# Patient Record
Sex: Female | Born: 1935 | Race: White | Hispanic: No | Marital: Married | Smoking: Never smoker
Health system: Southern US, Community
[De-identification: ages and names within clinical notes are randomized; demographics above are authoritative.]

## PROBLEM LIST (undated history)

## (undated) DIAGNOSIS — I471 Supraventricular tachycardia, unspecified: Secondary | ICD-10-CM

## (undated) DIAGNOSIS — T7840XA Allergy, unspecified, initial encounter: Secondary | ICD-10-CM

## (undated) DIAGNOSIS — I493 Ventricular premature depolarization: Secondary | ICD-10-CM

## (undated) DIAGNOSIS — I10 Essential (primary) hypertension: Secondary | ICD-10-CM

## (undated) DIAGNOSIS — C50219 Malignant neoplasm of upper-inner quadrant of unspecified female breast: Secondary | ICD-10-CM

## (undated) DIAGNOSIS — K219 Gastro-esophageal reflux disease without esophagitis: Secondary | ICD-10-CM

## (undated) DIAGNOSIS — N189 Chronic kidney disease, unspecified: Secondary | ICD-10-CM

## (undated) DIAGNOSIS — I491 Atrial premature depolarization: Secondary | ICD-10-CM

## (undated) DIAGNOSIS — C50919 Malignant neoplasm of unspecified site of unspecified female breast: Secondary | ICD-10-CM

## (undated) DIAGNOSIS — L719 Rosacea, unspecified: Secondary | ICD-10-CM

## (undated) DIAGNOSIS — R011 Cardiac murmur, unspecified: Secondary | ICD-10-CM

## (undated) DIAGNOSIS — M1990 Unspecified osteoarthritis, unspecified site: Secondary | ICD-10-CM

## (undated) DIAGNOSIS — E119 Type 2 diabetes mellitus without complications: Secondary | ICD-10-CM

## (undated) DIAGNOSIS — E785 Hyperlipidemia, unspecified: Secondary | ICD-10-CM

## (undated) DIAGNOSIS — Z923 Personal history of irradiation: Secondary | ICD-10-CM

## (undated) HISTORY — DX: Cardiac murmur, unspecified: R01.1

## (undated) HISTORY — DX: Rosacea, unspecified: L71.9

## (undated) HISTORY — DX: Supraventricular tachycardia: I47.1

## (undated) HISTORY — DX: Unspecified osteoarthritis, unspecified site: M19.90

## (undated) HISTORY — PX: OTHER SURGICAL HISTORY: SHX169

## (undated) HISTORY — DX: Supraventricular tachycardia, unspecified: I47.10

## (undated) HISTORY — PX: SPHINCTEROTOMY: SHX5279

## (undated) HISTORY — PX: BELPHAROPTOSIS REPAIR: SHX369

## (undated) HISTORY — PX: RECTOCELE REPAIR: SHX761

## (undated) HISTORY — DX: Hyperlipidemia, unspecified: E78.5

## (undated) HISTORY — DX: Ventricular premature depolarization: I49.3

## (undated) HISTORY — DX: Atrial premature depolarization: I49.1

## (undated) HISTORY — DX: Type 2 diabetes mellitus without complications: E11.9

## (undated) HISTORY — DX: Allergy, unspecified, initial encounter: T78.40XA

## (undated) HISTORY — PX: JOINT REPLACEMENT: SHX530

## (undated) HISTORY — DX: Essential (primary) hypertension: I10

## (undated) HISTORY — DX: Malignant neoplasm of upper-inner quadrant of unspecified female breast: C50.219

## (undated) HISTORY — PX: EYE SURGERY: SHX253

---
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
## 1975-03-03 HISTORY — PX: TUBAL LIGATION: SHX77

## 1975-03-03 HISTORY — PX: ABDOMINAL HYSTERECTOMY: SHX81

## 1983-03-03 DIAGNOSIS — R011 Cardiac murmur, unspecified: Secondary | ICD-10-CM

## 1983-03-03 HISTORY — DX: Cardiac murmur, unspecified: R01.1

## 1990-03-02 DIAGNOSIS — I10 Essential (primary) hypertension: Secondary | ICD-10-CM

## 1990-03-02 HISTORY — DX: Essential (primary) hypertension: I10

## 2002-03-02 DIAGNOSIS — M1990 Unspecified osteoarthritis, unspecified site: Secondary | ICD-10-CM

## 2002-03-02 HISTORY — DX: Unspecified osteoarthritis, unspecified site: M19.90

## 2002-03-02 HISTORY — PX: OTHER SURGICAL HISTORY: SHX169

## 2002-10-04 ENCOUNTER — Ambulatory Visit (HOSPITAL_BASED_OUTPATIENT_CLINIC_OR_DEPARTMENT_OTHER): Admission: RE | Admit: 2002-10-04 | Discharge: 2002-10-04 | Payer: Self-pay | Admitting: Orthopedic Surgery

## 2004-10-14 ENCOUNTER — Ambulatory Visit: Payer: Self-pay | Admitting: Internal Medicine

## 2004-10-28 ENCOUNTER — Ambulatory Visit: Payer: Self-pay | Admitting: Internal Medicine

## 2004-10-28 ENCOUNTER — Encounter (INDEPENDENT_AMBULATORY_CARE_PROVIDER_SITE_OTHER): Payer: Self-pay | Admitting: *Deleted

## 2005-01-01 ENCOUNTER — Ambulatory Visit: Payer: Self-pay | Admitting: Ophthalmology

## 2005-01-07 ENCOUNTER — Ambulatory Visit: Payer: Self-pay | Admitting: Ophthalmology

## 2005-02-19 ENCOUNTER — Ambulatory Visit: Payer: Self-pay | Admitting: Ophthalmology

## 2005-03-02 HISTORY — PX: CATARACT EXTRACTION: SUR2

## 2005-03-04 ENCOUNTER — Ambulatory Visit: Payer: Self-pay | Admitting: Ophthalmology

## 2007-03-03 DIAGNOSIS — E119 Type 2 diabetes mellitus without complications: Secondary | ICD-10-CM

## 2007-03-03 HISTORY — DX: Type 2 diabetes mellitus without complications: E11.9

## 2008-09-09 DIAGNOSIS — C4491 Basal cell carcinoma of skin, unspecified: Secondary | ICD-10-CM

## 2008-09-09 HISTORY — DX: Basal cell carcinoma of skin, unspecified: C44.91

## 2009-03-02 HISTORY — PX: COLONOSCOPY: SHX174

## 2009-03-02 LAB — HM COLONOSCOPY

## 2009-08-26 ENCOUNTER — Ambulatory Visit: Payer: Self-pay | Admitting: Family Medicine

## 2009-09-23 ENCOUNTER — Encounter: Payer: Self-pay | Admitting: Internal Medicine

## 2009-10-04 ENCOUNTER — Encounter (INDEPENDENT_AMBULATORY_CARE_PROVIDER_SITE_OTHER): Payer: Self-pay | Admitting: *Deleted

## 2009-11-01 ENCOUNTER — Encounter (INDEPENDENT_AMBULATORY_CARE_PROVIDER_SITE_OTHER): Payer: Self-pay | Admitting: *Deleted

## 2009-11-06 ENCOUNTER — Ambulatory Visit: Payer: Self-pay | Admitting: Internal Medicine

## 2009-11-13 ENCOUNTER — Ambulatory Visit: Payer: Self-pay | Admitting: Internal Medicine

## 2009-11-17 ENCOUNTER — Encounter: Payer: Self-pay | Admitting: Internal Medicine

## 2010-03-02 DIAGNOSIS — C50919 Malignant neoplasm of unspecified site of unspecified female breast: Secondary | ICD-10-CM

## 2010-03-02 DIAGNOSIS — C50219 Malignant neoplasm of upper-inner quadrant of unspecified female breast: Secondary | ICD-10-CM

## 2010-03-02 DIAGNOSIS — Z923 Personal history of irradiation: Secondary | ICD-10-CM

## 2010-03-02 DIAGNOSIS — E785 Hyperlipidemia, unspecified: Secondary | ICD-10-CM

## 2010-03-02 HISTORY — DX: Malignant neoplasm of unspecified site of unspecified female breast: C50.919

## 2010-03-02 HISTORY — DX: Hyperlipidemia, unspecified: E78.5

## 2010-03-02 HISTORY — DX: Personal history of irradiation: Z92.3

## 2010-03-02 HISTORY — DX: Malignant neoplasm of upper-inner quadrant of unspecified female breast: C50.219

## 2010-03-02 HISTORY — PX: BREAST LUMPECTOMY: SHX2

## 2010-03-02 HISTORY — PX: BREAST MAMMOSITE: SHX5264

## 2010-03-02 HISTORY — PX: OTHER SURGICAL HISTORY: SHX169

## 2010-04-01 NOTE — Letter (Signed)
Summary: [facility] Instructions  [facility]  [address] [place], [place] [address]   Phone: [phone]  Fax: [phone]       Paige Medina    January 18, 1934    MRN: [id number]        Procedure Day Dorna Bloom: Wednesday   11-13-09     Arrival Time: 12:30 p.m.     Procedure Time: 1:30 p.m.     Location of Procedure:                    _x_  [facility] (4th Floor)   PREPARATION FOR COLONOSCOPY WITH MOVIPREP   Starting 5 days prior to your procedure  11-08-09 do not eat nuts, seeds, popcorn, corn, beans, peas,  salads, or any raw vegetables.  Do not take any fiber supplements (e.g. Metamucil, Citrucel, and Benefiber).  THE DAY BEFORE YOUR PROCEDURE         DATE:  11-12-73  DAY: Tuesday  1.  Drink clear liquids the entire day-NO SOLID FOOD  2.  Do not drink anything colored red or purple.  Avoid juices with pulp.  No orange juice.  3.  Drink at least 64 oz. (8 glasses) of fluid/clear liquids during the day to prevent dehydration and help the prep work efficiently.  CLEAR LIQUIDS INCLUDE: Water Jello Ice Popsicles Tea (sugar ok, no milk/cream) Powdered fruit flavored drinks Coffee (sugar ok, no milk/cream) Gatorade Juice: apple, white grape, white cranberry  Lemonade Clear bullion, consomm, broth Carbonated beverages (any kind) Strained chicken noodle soup Hard Candy                             4.  In the morning, mix first dose of MoviPrep solution:    Empty 1 Pouch A and 1 Pouch B into the disposable container    Add lukewarm drinking water to the top line of the container. Mix to dissolve    Refrigerate (mixed solution should be used within 24 hrs)  5.  Begin drinking the prep at 5:00 p.m. The MoviPrep container is divided by 4 marks.   Every 15 minutes drink the solution down to the next mark (approximately 8 oz) until the full liter is complete.   6.  Follow completed prep with 16 oz of clear liquid of your choice (Nothing red or  purple).  Continue to drink clear liquids until bedtime.  7.  Before going to bed, mix second dose of MoviPrep solution:    Empty 1 Pouch A and 1 Pouch B into the disposable container    Add lukewarm drinking water to the top line of the container. Mix to dissolve    Refrigerate  THE DAY OF YOUR PROCEDURE      DATE:  11-13-09 DAY: Wednesday  Beginning at  8:30 a.m. (5 hours before procedure):         1. Every 15 minutes, drink the solution down to the next mark (approx 8 oz) until the full liter is complete.  2. Follow completed prep with 16 oz. of clear liquid of your choice.    3. You may drink clear liquids until  11:30 a.m.  (2 HOURS BEFORE PROCEDURE).   MEDICATION INSTRUCTIONS  Unless otherwise instructed, you should take regular prescription medications with a small sip of water   as early as possible the morning of your procedure.  Additional medication instructions: Hold Lasartan/HCTZ the morning of procedure.  OTHER INSTRUCTIONS  You will need a responsible adult at least 75 years of age to accompany you and drive you home.   This person must remain in the waiting room during your procedure.  Wear loose fitting clothing that is easily removed.  Leave jewelry and other valuables at home.  However, you may wish to bring a book to read or  an iPod/MP3 player to listen to music as you wait for your procedure to start.  Remove all body piercing jewelry and leave at home.  Total time from sign-in until discharge is approximately 2-3 hours.  You should go home directly after your procedure and rest.  You can resume normal activities the  day after your procedure.  The day of your procedure you should not:   Drive   Make legal decisions   Operate machinery   Drink alcohol   Return to work  You will receive specific instructions about eating, activities and medications before you leave.    The above instructions have been reviewed and explained  to me by  Wyona Almas RN  November 06, 2009 1:07 PM     I fully understand and can verbalize these instructions _____________________________ Date _________

## 2010-04-01 NOTE — Letter (Signed)
Summary: Previsit letter  [facility]  [address] [place], [place] [address]   Phone: [phone]  Fax: [phone]       10/04/2009 MRN: [id number]  Paige Medina [address] [place], [place]  [address]  Dear [name],  Welcome to the Gastroenterology Division at [facility].    You are scheduled to see a nurse for your pre-procedure visit on 11/06/2009 at 1:00 pm on the 3rd floor at [facility], [address]  We ask that you try to arrive at our office 15 minutes prior to your appointment time to allow for check-in.  Your nurse visit will consist of discussing your medical and surgical history, your immediate family medical history, and your medications.    Please bring a complete list of all your medications or, if you prefer, bring the medication bottles and we will list them.  We will need to be aware of both prescribed and over the counter drugs.  We will need to know exact dosage information as well.  If you are on blood thinners (Coumadin, Plavix, Aggrenox, Ticlid, etc.) please call our office today/prior to your appointment, as we need to consult with your physician about holding your medication.   Please be prepared to read and sign documents such as consent forms, a financial agreement, and acknowledgement forms.  If necessary, and with your consent, a friend or relative is welcome to sit-in on the nurse visit with you.  Please bring your insurance card so that we may make a copy of it.  If your insurance requires a referral to see a specialist, please bring your referral form from your primary care physician.  No co-pay is required for this nurse visit.     If you cannot keep your appointment, please call [phone] to cancel or reschedule prior to your appointment date.  This allows [place] the opportunity to schedule an appointment for another patient in need of care.    Thank you for choosing [facility] for your medical  needs.  We appreciate the opportunity to care for you.  Please visit [place] at our website  to learn more about our practice.                     Sincerely.                                                                                                                   The Gastroenterology Division

## 2010-04-01 NOTE — Letter (Signed)
Summary: Colonoscopy Letter  [facility]  [address] [place], [place] [address]   Phone: [phone]  Fax: [phone]      September 23, 2009 MRN: [id number]   Paige Medina [address] [place], [place]  [address]   Dear Ms. Fleeger,   According to your medical record, it is time for you to schedule a Colonoscopy. The American Cancer Society recommends this procedure as a method to detect early colon cancer. Patients with a family history of colon cancer, or a personal history of colon polyps or inflammatory bowel disease are at increased risk.  This letter has been generated based on the recommendations made at the time of your procedure. If you feel that in your particular situation this may no longer apply, please contact our office.  Please call our office at [phone] to schedule this appointment or to update your records at your earliest convenience.  Thank you for cooperating with [place] to provide you with the very best care possible.   Sincerely,  Wilhemina Bonito. Marina Goodell, M.D.  [facility] Gastroenterology Division [phone]

## 2010-04-01 NOTE — Procedures (Signed)
Summary: Colonoscopy  Patient: Cyra Spader Note: All result statuses are Final unless otherwise noted.  Tests: (1) Colonoscopy (COL)   COL Colonoscopy           DONE     [facility]     [address]     [place], [place]  [address]           COLONOSCOPY PROCEDURE REPORT           PATIENT:  Valisha, Heslin  MR#:  [id number]     BIRTHDATE:  Jul 28, 1933, 76 yrs. old  GENDER:  female     ENDOSCOPIST:  Wilhemina Bonito. Eda Keys, MD     REF. BY:  Surveillance Program Recall     PROCEDURE DATE:  11/13/2009     PROCEDURE:  Colonoscopy with snare polypectomy x 1     ASA CLASS:  Class II     INDICATIONS:  history of polyps, surveillance and high-risk     screening ; index 2003 NAT ; 2006 w/ HP     MEDICATIONS:   Fentanyl 75 mcg IV, Versed 9 mg IV           DESCRIPTION OF PROCEDURE:   After the risks benefits and     alternatives of the procedure were thoroughly explained, informed     consent was obtained.  Digital rectal exam was performed and     revealed no abnormalities.   The LB CF-H180AL [id number] endoscope     was introduced through the anus and advanced to the cecum, which     was identified by both the appendix and ileocecal valve, without     limitations.Time to cecum = 5:57 min  The quality of the prep was     good, using MoviPrep.  The instrument was then slowly withdrawn     (time = 13:29 min) as the colon was fully examined.     <<PROCEDUREIMAGES>>           FINDINGS:  A diminutive polyp was found in the mid transverse     colon.  Moderate diverticulosis was found in the sigmoid colon.     This was otherwise a normal examination of the colon.   Retroflexed     views in the rectum revealed internal hemorrhoids.    The scope     was then withdrawn from the patient and the procedure completed.           COMPLICATIONS:  None           ENDOSCOPIC IMPRESSION:     1) Diminutive polyp in the mid transverse colon - removed     2) Moderate diverticulosis in the sigmoid  colon     3) Otherwise normal examination     4) Internal hemorrhoids           RECOMMENDATIONS:     1) Repeat colonoscopy in 5 years if polyp adenomatous; otherwise     prn     ______________________________     Wilhemina Bonito. Eda Keys, MD           CC:  Adriana Reams MD; The Patient           n.     eSIGNED:   Wilhemina Bonito. Eda Keys at 11/13/2009 02:38 PM           Ronnald Collum, [id number]  Note: An exclamation mark (!) indicates a result that was not dispersed into the flowsheet. Document Creation Date: 11/13/2009 2:39  PM _______________________________________________________________________  (1) Order result status: Final Collection or observation date-time: 11/13/2009 14:29 Requested date-time:  Receipt date-time:  Reported date-time:  Referring Physician:   Ordering Physician: Fransico Setters [id number]) Specimen Source:  Source: Launa Grill Order Number: [id number] Lab site:   Appended Document: Colonoscopy recall     Procedures Next Due Date:    Colonoscopy: 11/2014

## 2010-04-01 NOTE — Miscellaneous (Signed)
Summary: [facility] Previsit/Prep  Clinical Lists Changes  Medications: Added new medication of MOVIPREP 100 GM  SOLR (PEG-KCL-NACL-NASULF-NA ASC-C) As per prep instructions. - Signed Rx of MOVIPREP 100 GM  SOLR (PEG-KCL-NACL-NASULF-NA ASC-C) As per prep instructions.;  #1 x 0;  Signed;  Entered by: Wyona Almas RN;  Authorized by: Hilarie Fredrickson MD;  Method used: Electronically to [facility]*, [address], [place], [place]  [address], Ph: [phone], Fax: [phone] Allergies: Added new allergy or adverse reaction of FLAGYL Observations: Added new observation of NKA: F (11/06/2009 12:26)    Prescriptions: MOVIPREP 100 GM  SOLR (PEG-KCL-NACL-NASULF-NA ASC-C) As per prep instructions.  #1 x 0   Entered by:   Wyona Almas RN   Authorized by:   Hilarie Fredrickson MD   Signed by:   Wyona Almas RN on 11/06/2009   Method used:   Electronically to        [facility]* (retail)       [address]       [place], [place]  [address]       Ph: [phone]       Fax: [phone]   RxID:   [id number]

## 2010-04-01 NOTE — Letter (Signed)
Summary: Patient Notice- Polyp Results  [facility]  [address] [place], [place] [address]   Phone: [phone]  Fax: [phone]        November 17, 2009 MRN: [id number]    KRYSTYL CANNELL [address] [place], [place]  [address]    Dear Ms. Province,  I am pleased to inform you that the colon polyp(s) removed during your recent colonoscopy was (were) found to be benign (no cancer detected) upon pathologic examination.  I recommend you have a repeat colonoscopy examination in 5 years to look for recurrent polyps, as having colon polyps increases your risk for having recurrent polyps or even colon cancer in the future.  Should you develop new or worsening symptoms of abdominal pain, bowel habit changes or bleeding from the rectum or bowels, please schedule an evaluation with either your primary care physician or with me.  Additional information/recommendations:  __ No further action with gastroenterology is needed at this time. Please      follow-up with your primary care physician for your other healthcare      needs.   Please call us if you are having persistent problems or have questions about your condition that have not been fully answered at this time.  Sincerely,  Hilarie Fredrickson MD  This letter has been electronically signed by your physician.  Appended Document: Patient Notice- Polyp Results letter mailed

## 2010-06-17 ENCOUNTER — Ambulatory Visit: Payer: Self-pay | Admitting: Family Medicine

## 2010-07-18 NOTE — Op Note (Signed)
   NAME:  Paige Medina, CISLO                       ACCOUNT NO.:  [id number]   MEDICAL RECORD NO.:  [id number]                   PATIENT TYPE:  AMB   LOCATION:  DSC                                  FACILITY:  [facility]   PHYSICIAN:  Artist Pais. Mina Marble, M.D.           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   PROCEDURE:  Left wrist arthroscopic ganglionectomy and injection of  bilateral carpometacarpal joints.   SURGEON:  Artist Pais. Mina Marble, M.D.   ASSISTANT:  Aura Fey. Bobbe Medico.   ANESTHESIA:  General.   TOURNIQUET TIME:  45 minutes.   COMPLICATIONS:  None.   DRAINS:  None.   PROCEDURE:  The patient was taken to the operating room and after the  induction of adequate general anesthesia the left upper extremity was  prepped and draped in the usual sterile fashion. An Esmarch was used to  exsanguinate the limb.  The tourniquet was then inflated to 250 mmHg at this  point in time. The upper extremity on the left side was padded and placed in  the Concept wrist traction tower with 12 pounds of counter-traction across  the radial carpal joint.  A 4-5 portal was established followed by under  direct vision a 6-2 outflow portal with an 18 gauge needle.  The scope was  then placed in the interval between the scapholunate.  Traction was placed  over the ganglion cyst dorsally and the stalk could be seen. An 18 gauge  needle was then used to pierce the cyst into the joint.  Cyst fluid was seen  going into the joint.  A 15-blade was then used to incise the skin over the  cyst and the suction shaver was then introduced and the stalk was shaved  down. A dorsal shaving was also undertaken to the extensor tendon  where  visible in the arthroscopic field.  The cyst was completely decompressed  through this 3-4 portal.  These wounds were closed with 5-0 nylon and the  patient's bilateral CMC joints were then injected with a combination of  Marcaine and Kenalog.  A sterile dressing was then placed with Xeroform, 4 x  4's and a volar splint.  The patient tolerated the procedure well and went  to recovery in satisfactory fashion.                                               Artist Pais Mina Marble, M.D.    MAW/MEDQ  D:  10/04/2002  T:  10/04/2002  Job:  [id number]

## 2010-09-16 ENCOUNTER — Ambulatory Visit: Payer: Self-pay | Admitting: Family Medicine

## 2010-09-16 LAB — HM DEXA SCAN

## 2010-09-17 ENCOUNTER — Ambulatory Visit: Payer: Self-pay | Admitting: Family Medicine

## 2010-10-01 ENCOUNTER — Ambulatory Visit: Payer: Self-pay | Admitting: Radiation Oncology

## 2010-10-03 ENCOUNTER — Ambulatory Visit: Payer: Self-pay | Admitting: General Surgery

## 2010-10-09 ENCOUNTER — Ambulatory Visit: Payer: Self-pay | Admitting: General Surgery

## 2010-10-29 ENCOUNTER — Ambulatory Visit: Payer: Self-pay | Admitting: Radiation Oncology

## 2010-11-01 ENCOUNTER — Ambulatory Visit: Payer: Self-pay | Admitting: Radiation Oncology

## 2010-11-11 ENCOUNTER — Ambulatory Visit: Payer: Self-pay | Admitting: Radiation Oncology

## 2010-12-01 ENCOUNTER — Ambulatory Visit: Payer: Self-pay | Admitting: Radiation Oncology

## 2011-01-01 ENCOUNTER — Ambulatory Visit: Payer: Self-pay | Admitting: Radiation Oncology

## 2011-04-01 ENCOUNTER — Ambulatory Visit: Payer: Self-pay | Admitting: General Surgery

## 2011-05-20 ENCOUNTER — Ambulatory Visit: Payer: Self-pay | Admitting: Radiation Oncology

## 2011-06-01 ENCOUNTER — Ambulatory Visit: Payer: Self-pay | Admitting: Radiation Oncology

## 2011-09-21 ENCOUNTER — Ambulatory Visit: Payer: Self-pay | Admitting: General Surgery

## 2012-03-28 ENCOUNTER — Ambulatory Visit: Payer: Self-pay | Admitting: General Surgery

## 2012-07-20 ENCOUNTER — Encounter: Payer: Self-pay | Admitting: *Deleted

## 2012-07-20 DIAGNOSIS — Z853 Personal history of malignant neoplasm of breast: Secondary | ICD-10-CM | POA: Insufficient documentation

## 2012-09-14 ENCOUNTER — Ambulatory Visit: Payer: Self-pay | Admitting: Family Medicine

## 2012-10-04 ENCOUNTER — Ambulatory Visit: Payer: Self-pay | Admitting: General Surgery

## 2012-10-05 ENCOUNTER — Encounter: Payer: Self-pay | Admitting: General Surgery

## 2012-10-17 ENCOUNTER — Encounter: Payer: Self-pay | Admitting: General Surgery

## 2012-10-17 ENCOUNTER — Ambulatory Visit (INDEPENDENT_AMBULATORY_CARE_PROVIDER_SITE_OTHER): Payer: Medicare Other | Admitting: General Surgery

## 2012-10-17 VITALS — BP 124/68 | HR 74 | Resp 12 | Ht 66.0 in | Wt 157.0 lb

## 2012-10-17 DIAGNOSIS — Z853 Personal history of malignant neoplasm of breast: Secondary | ICD-10-CM

## 2012-10-17 NOTE — Progress Notes (Signed)
Patient ID: Paige Medina, female   DOB: 1933-10-22, 79 y.o.   MRN: [id number]  Chief Complaint  Patient presents with  . Other    mammogram    HPI Paige Medina is a 79 y.o. female who presents for a breast evaluation. The most recent mammogram was done on 10/04/12 with a birad category 3.Patient does perform regular self breast checks and gets regular mammograms done.  The patient denies any new problems with the breasts.  Pt in 2 years postlumpectomy, SN biopsy/radiation for her left  breast cancer. Patient did not tolerate Aromatase Inhibitors-  Declines to try again.   HPI  Past Medical History  Diagnosis Date  . Arthritis 2004  . Cancer 2012    left breast  . Diabetes mellitus without complication 2009    non insulin dependent  . Hypertension 1992  . Bone spur   . H/O cystitis   . Murmur 1985  . Personal history of malignant neoplasm of breast 2012  . Breast screening, unspecified 2012  . Lump or mass in breast 2012  . Special screening for malignant neoplasms, colon   . Hyperlipidemia 2012  . Malignant neoplasm of upper-inner quadrant of female breast 2012    Past Surgical History  Procedure Laterality Date  . Colonoscopy  2011    [place]  . Basal cell carcinoma removal  1980,2013    arms, legs, around neck, on right ear  . Eye surgery Bilateral 2007    cataract surgery  . Breast mammosite Left 2012    placed and removed  . Rectocele repair  [id number]  . Sphincterotomy    . Breast surgery Left 2012    lumpectomy  . Abdominal hysterectomy  1978    partial  . Tubal ligation  1977  . Tonsillectomy  1947  . Appendectomy  1943    Family History  Problem Relation Age of Onset  . Cancer Maternal Grandfather     lung cancer    Social History History  Substance Use Topics  . Smoking status: Never Smoker   . Smokeless tobacco: Not on file  . Alcohol Use: Yes     Comment: ocassionally    Allergies  Allergen Reactions  . Epinephrine Other (See  Comments)    Blisters, confirmed(2012) by Dr. Joana Reamer since pt last visit couldn't remember  . Metronidazole     REACTION: Nausea/vomiting  . Tape Other (See Comments)    Blisters    Current Outpatient Prescriptions  Medication Sig Dispense Refill  . Calcium Carbonate-Vitamin D (CALCIUM 600 + D PO) Take by mouth.      . Coenzyme Q10 (COQ-10 PO) Take by mouth.      Tery Sanfilippo Calcium (STOOL SOFTENER PO) Take by mouth.      . metoprolol (LOPRESSOR) 50 MG tablet Take 25 mg by mouth daily.      . Multiple Vitamins-Minerals (CENTRUM SILVER PO) Take by mouth.      . olmesartan-hydrochlorothiazide (BENICAR HCT) 20-12.5 MG per tablet Take 1 tablet by mouth daily.      . Probiotic Product (PROBIOTIC DAILY PO) Take by mouth.      . simvastatin (ZOCOR) 40 MG tablet Take 40 mg by mouth daily.       No current facility-administered medications for this visit.    Review of Systems Review of Systems  Constitutional: Negative.   Respiratory: Negative.   Cardiovascular: Negative.     Blood pressure 124/68, pulse 74, resp. rate 12, height 5\' 6"  (  1.676 m), weight 157 lb (71.215 kg).  Physical Exam Physical Exam  Constitutional: She is oriented to person, place, and time. She appears well-developed and well-nourished.  Eyes: Conjunctivae are normal. No scleral icterus.  Neck: No thyromegaly present.  Cardiovascular: Normal rate, regular rhythm, normal heart sounds and normal pulses.   No murmur heard. Pulses:      Dorsalis pedis pulses are 2+ on the right side, and 2+ on the left side.       Posterior tibial pulses are 2+ on the right side, and 2+ on the left side.  No edema. No VV  Pulmonary/Chest: Effort normal and breath sounds normal. Right breast exhibits no inverted nipple, no mass, no nipple discharge, no skin change and no tenderness. Left breast exhibits no inverted nipple, no mass, no nipple discharge, no skin change and no tenderness.  Left breast - minimal scarring at lumpectomy  site. Concentrated at the lateral end.   Abdominal: Soft. Bowel sounds are normal. There is no hepatosplenomegaly. There is no tenderness. No hernia.  Lymphadenopathy:    She has no cervical adenopathy.    She has no axillary adenopathy.  Neurological: She is alert and oriented to person, place, and time.  Skin: Skin is warm and dry.    Data Reviewed Mammogram reviewed.   Assessment    Stable exam.     Plan Bilateral Diagnostic Mammogram in 1 year.        SANKAR,SEEPLAPUTHUR G 10/18/2012, 9:39 AM

## 2012-10-17 NOTE — Patient Instructions (Addendum)
Patient to return in 1 year with bilateral diagnostic mammogram.  

## 2012-10-18 ENCOUNTER — Encounter: Payer: Self-pay | Admitting: General Surgery

## 2013-01-05 ENCOUNTER — Other Ambulatory Visit: Payer: Self-pay

## 2013-03-02 LAB — HM DIABETES EYE EXAM

## 2013-03-15 LAB — HEMOGLOBIN A1C: A1C: 6.3

## 2013-03-15 LAB — COMPREHENSIVE METABOLIC PANEL
CREATININE: 1.1
Calcium: 10 mg/dL
GFR: 49

## 2013-03-15 LAB — LIPID PANEL
Cholesterol: 158 mg/dL (ref 0–200)
HDL: 59 mg/dL (ref 35–70)
LDL CALC: 50
Triglycerides: 243

## 2013-06-13 ENCOUNTER — Encounter: Payer: Self-pay | Admitting: Family Medicine

## 2013-06-13 ENCOUNTER — Ambulatory Visit (INDEPENDENT_AMBULATORY_CARE_PROVIDER_SITE_OTHER): Payer: Medicare HMO | Admitting: Family Medicine

## 2013-06-13 VITALS — BP 144/74 | HR 64 | Temp 97.7°F | Ht 65.5 in | Wt 157.5 lb

## 2013-06-13 DIAGNOSIS — E1169 Type 2 diabetes mellitus with other specified complication: Secondary | ICD-10-CM | POA: Insufficient documentation

## 2013-06-13 DIAGNOSIS — M1990 Unspecified osteoarthritis, unspecified site: Secondary | ICD-10-CM | POA: Insufficient documentation

## 2013-06-13 DIAGNOSIS — L719 Rosacea, unspecified: Secondary | ICD-10-CM | POA: Insufficient documentation

## 2013-06-13 DIAGNOSIS — C50919 Malignant neoplasm of unspecified site of unspecified female breast: Secondary | ICD-10-CM

## 2013-06-13 DIAGNOSIS — R7303 Prediabetes: Secondary | ICD-10-CM | POA: Insufficient documentation

## 2013-06-13 DIAGNOSIS — E119 Type 2 diabetes mellitus without complications: Secondary | ICD-10-CM

## 2013-06-13 DIAGNOSIS — N189 Chronic kidney disease, unspecified: Secondary | ICD-10-CM

## 2013-06-13 DIAGNOSIS — E785 Hyperlipidemia, unspecified: Secondary | ICD-10-CM

## 2013-06-13 DIAGNOSIS — I10 Essential (primary) hypertension: Secondary | ICD-10-CM

## 2013-06-13 DIAGNOSIS — N183 Chronic kidney disease, stage 3 unspecified: Secondary | ICD-10-CM | POA: Insufficient documentation

## 2013-06-13 DIAGNOSIS — M129 Arthropathy, unspecified: Secondary | ICD-10-CM

## 2013-06-13 DIAGNOSIS — E1122 Type 2 diabetes mellitus with diabetic chronic kidney disease: Secondary | ICD-10-CM | POA: Insufficient documentation

## 2013-06-13 NOTE — Progress Notes (Signed)
BP 144/74  Pulse 64  Temp(Src) 97.7 F (36.5 C) (Oral)  Ht 5' 5.5" (1.664 m)  Wt 157 lb 8 oz (71.442 kg)  BMI 25.80 kg/m2   CC: new pt to establish  Subjective:    Patient ID: Paige Medina, female    DOB: May 23, 1933, 80 y.o.   MRN: [id number]  HPI: Paige Medina is a 80 y.o. female presenting on 06/13/2013 for Establish Care   Prior saw Dr. Jeananne Rama.  Bad hip arthritis - R more painful but L looks works.  Pending R hip injection (Dr. Eliberto Ivory at [facility])  HTN - compliant with benicar hctz 20/12.5mg  and toprol xl 50mg  1/2 tablet daily.  No low bp sxs or readings.  No HA, vision changes, CP/tightness, SOB, leg swelling.    ?kidney disease.  Due for recheck.  DM - states diet controlled.  Last checked 4 mo ago.  Occasional hand paresthesias.  No low sugar sxs.  HLD - lipitor caused myalgias.  Tolerating simvastatin will.  H/o recurrent skin cancer in past - basal and squamous.  No h/o melanoma.  Lives with husband, no pets Grown children Occupation: retired, varied jobs Veterinary surgeon Activity: gym 3d/wk, active mentally with crosswords Diet: good water, fruits/vegetables daily  Preventative: Last CPE 09/2012 Colon cancer screening 2011 Mammogram 10/2012 Pneumovax 2002? Relevant past medical, surgical, family and social history reviewed and updated as indicated.  Allergies and medications reviewed and updated. Current Outpatient Prescriptions on File Prior to Visit  Medication Sig  . Calcium Carbonate-Vitamin D (CALCIUM 600 + D PO) Take 1 tablet by mouth 2 (two) times daily.   . Coenzyme Q10 (COQ-10 PO) Take 1 capsule by mouth 3 (three) times a week.   Mariane Baumgarten Calcium (STOOL SOFTENER PO) Take 1 capsule by mouth daily.   . Multiple Vitamins-Minerals (CENTRUM SILVER PO) Take 1 tablet by mouth 2 (two) times a week.   . olmesartan-hydrochlorothiazide (BENICAR HCT) 20-12.5 MG per tablet Take 1 tablet by mouth daily.  . Probiotic Product (PROBIOTIC DAILY PO) Take 1 capsule by  mouth daily.   . simvastatin (ZOCOR) 40 MG tablet Take 40 mg by mouth daily.   No current facility-administered medications on file prior to visit.    Review of Systems Per HPI unless specifically indicated above    Objective:    BP 144/74  Pulse 64  Temp(Src) 97.7 F (36.5 C) (Oral)  Ht 5' 5.5" (1.664 m)  Wt 157 lb 8 oz (71.442 kg)  BMI 25.80 kg/m2  Physical Exam  Nursing note and vitals reviewed. Constitutional: She is oriented to person, place, and time. She appears well-developed and well-nourished. No distress.  HENT:  Head: Normocephalic and atraumatic.  Mouth/Throat: Uvula is midline, oropharynx is clear and moist and mucous membranes are normal.  Eyes: Conjunctivae and EOM are normal. Pupils are equal, round, and reactive to light. No scleral icterus.  Neck: Normal range of motion. Neck supple. Carotid bruit is not present. No thyromegaly present.  Cardiovascular: Normal rate, regular rhythm, normal heart sounds and intact distal pulses.   No murmur heard. Pulses:      Radial pulses are 2+ on the right side, and 2+ on the left side.  Pulmonary/Chest: Effort normal and breath sounds normal. No respiratory distress. She has no wheezes. She has no rales.  Musculoskeletal: Normal range of motion. She exhibits no edema.  Lymphadenopathy:    She has no cervical adenopathy.  Neurological: She is alert and oriented to person, place, and time.  CN grossly intact, station and gait intact  Skin: Skin is warm and dry. No rash noted.  Psychiatric: She has a normal mood and affect. Her behavior is normal. Judgment and thought content normal.   Results for orders placed in visit on 06/13/13  HM DIABETES EYE EXAM      Result Value Ref Range   HM Diabetic Eye Exam No Retinopathy  No Retinopathy      Assessment & Plan:   Problem List Items Addressed This Visit   Breast cancer     S/p lumpectomy 2012.   Sounds like she did not tolerate evista.    Arthritis     Follows  regularly with ortho.  Pending L hip injection    Diabetes mellitus without complication - Primary     Check A1c today.  Diet controlled. Await records.    Relevant Orders      Renal function panel      Hemoglobin A1c      Microalbumin / creatinine urine ratio   Hypertension     Chronic, stable. Continue current regimen.    Relevant Medications      metoprolol succinate (TOPROL-XL) 50 MG 24 hr tablet   Hyperlipidemia     Chronic, continue simvastatin. Check FLP next fasting blood work.    Relevant Medications      metoprolol succinate (TOPROL-XL) 50 MG 24 hr tablet   Rosacea   CKD (chronic kidney disease)     Check renal panel today and await records to compare. Pt endorses recent h/o worsening kidney function, attributed to magnesium use - now off this supplement.        Follow up plan: Return in about 4 months (around 10/13/2013), or as needed, for annual exam, prior fasting for blood work.

## 2013-06-13 NOTE — Assessment & Plan Note (Signed)
Follows regularly with ortho.  Pending L hip injection

## 2013-06-13 NOTE — Assessment & Plan Note (Signed)
Check renal panel today and await records to compare. Pt endorses recent h/o worsening kidney function, attributed to magnesium use - now off this supplement.

## 2013-06-13 NOTE — Assessment & Plan Note (Signed)
Chronic, stable. Continue current regimen. 

## 2013-06-13 NOTE — Patient Instructions (Addendum)
Good to meet you today, call us with questions. Blood work today and we will call you with results. Return in 6 months for wellness exam, prior fasting for blood work.

## 2013-06-13 NOTE — Progress Notes (Signed)
Pre visit review using our clinic review tool, if applicable. No additional management support is needed unless otherwise documented below in the visit note. 

## 2013-06-13 NOTE — Assessment & Plan Note (Signed)
Chronic, continue simvastatin. Check FLP next fasting blood work.

## 2013-06-13 NOTE — Assessment & Plan Note (Signed)
S/p lumpectomy 2012.   Sounds like she did not tolerate evista.

## 2013-06-13 NOTE — Assessment & Plan Note (Signed)
Check A1c today.  Diet controlled. Await records.

## 2013-06-14 ENCOUNTER — Telehealth: Payer: Self-pay | Admitting: Family Medicine

## 2013-06-14 LAB — RENAL FUNCTION PANEL
ALBUMIN: 4.1 g/dL (ref 3.5–5.2)
BUN: 29 mg/dL — AB (ref 6–23)
CALCIUM: 10.3 mg/dL (ref 8.4–10.5)
CHLORIDE: 102 meq/L (ref 96–112)
CO2: 31 meq/L (ref 19–32)
CREATININE: 1.3 mg/dL — AB (ref 0.4–1.2)
GFR: 41.72 mL/min — ABNORMAL LOW (ref >60.00)
Glucose, Bld: 91 mg/dL (ref 70–99)
Phosphorus: 4 mg/dL (ref 2.3–4.6)
Potassium: 4.4 mEq/L (ref 3.5–5.1)
Sodium: 140 mEq/L (ref 135–145)

## 2013-06-14 LAB — MICROALBUMIN / CREATININE URINE RATIO
Creatinine,U: 41.5 mg/dL
MICROALB UR: 0.1 mg/dL (ref 0.0–1.9)
MICROALB/CREAT RATIO: 0.2 mg/g (ref 0.0–30.0)

## 2013-06-14 LAB — HEMOGLOBIN A1C: Hgb A1c MFr Bld: 6.6 % — ABNORMAL HIGH (ref 4.6–6.5)

## 2013-06-14 NOTE — Telephone Encounter (Signed)
Relevant patient education assigned to patient using Emmi. ° °

## 2013-06-27 ENCOUNTER — Telehealth: Payer: Self-pay

## 2013-06-27 NOTE — Telephone Encounter (Signed)
Relevant patient education assigned to patient using Emmi. ° °

## 2013-07-01 ENCOUNTER — Encounter: Payer: Self-pay | Admitting: Family Medicine

## 2013-07-03 ENCOUNTER — Encounter: Payer: Self-pay | Admitting: *Deleted

## 2013-07-20 ENCOUNTER — Encounter: Payer: Self-pay | Admitting: Family Medicine

## 2013-09-02 ENCOUNTER — Other Ambulatory Visit: Payer: Self-pay | Admitting: Family Medicine

## 2013-09-02 DIAGNOSIS — N183 Chronic kidney disease, stage 3 (moderate): Secondary | ICD-10-CM

## 2013-09-02 DIAGNOSIS — E785 Hyperlipidemia, unspecified: Secondary | ICD-10-CM

## 2013-09-06 ENCOUNTER — Other Ambulatory Visit (INDEPENDENT_AMBULATORY_CARE_PROVIDER_SITE_OTHER): Payer: Medicare HMO

## 2013-09-06 DIAGNOSIS — N183 Chronic kidney disease, stage 3 unspecified: Secondary | ICD-10-CM

## 2013-09-06 DIAGNOSIS — E785 Hyperlipidemia, unspecified: Secondary | ICD-10-CM

## 2013-09-06 DIAGNOSIS — I10 Essential (primary) hypertension: Secondary | ICD-10-CM

## 2013-09-06 DIAGNOSIS — E119 Type 2 diabetes mellitus without complications: Secondary | ICD-10-CM

## 2013-09-06 LAB — COMPREHENSIVE METABOLIC PANEL
ALK PHOS: 57 U/L (ref 39–117)
ALT: 26 U/L (ref 0–35)
AST: 28 U/L (ref 0–37)
Albumin: 3.9 g/dL (ref 3.5–5.2)
BILIRUBIN TOTAL: 0.7 mg/dL (ref 0.2–1.2)
BUN: 22 mg/dL (ref 6–23)
CO2: 31 mEq/L (ref 19–32)
Calcium: 9.6 mg/dL (ref 8.4–10.5)
Chloride: 103 mEq/L (ref 96–112)
Creatinine, Ser: 1.2 mg/dL (ref 0.4–1.2)
GFR: 46.14 mL/min — ABNORMAL LOW (ref >60.00)
GLUCOSE: 127 mg/dL — AB (ref 70–99)
Potassium: 4.7 mEq/L (ref 3.5–5.1)
Sodium: 140 mEq/L (ref 135–145)
Total Protein: 7.6 g/dL (ref 6.0–8.3)

## 2013-09-06 LAB — LIPID PANEL
CHOL/HDL RATIO: 4
CHOLESTEROL: 192 mg/dL (ref 0–200)
HDL: 53.6 mg/dL (ref >39.00)
LDL Cholesterol: 100 mg/dL — ABNORMAL HIGH (ref 0–99)
NonHDL: 138.4
Triglycerides: 192 mg/dL — ABNORMAL HIGH (ref 0.0–149.0)
VLDL: 38.4 mg/dL (ref 0.0–40.0)

## 2013-09-06 LAB — VITAMIN D 25 HYDROXY (VIT D DEFICIENCY, FRACTURES): VITD: 47.32 ng/mL

## 2013-09-11 ENCOUNTER — Other Ambulatory Visit: Payer: Medicare HMO

## 2013-09-14 ENCOUNTER — Ambulatory Visit (INDEPENDENT_AMBULATORY_CARE_PROVIDER_SITE_OTHER): Payer: Medicare HMO | Admitting: Family Medicine

## 2013-09-14 ENCOUNTER — Encounter: Payer: Self-pay | Admitting: Family Medicine

## 2013-09-14 VITALS — BP 116/78 | HR 56 | Temp 98.0°F | Ht 65.5 in | Wt 158.5 lb

## 2013-09-14 DIAGNOSIS — Z0000 Encounter for general adult medical examination without abnormal findings: Secondary | ICD-10-CM | POA: Insufficient documentation

## 2013-09-14 DIAGNOSIS — N183 Chronic kidney disease, stage 3 unspecified: Secondary | ICD-10-CM

## 2013-09-14 DIAGNOSIS — Z23 Encounter for immunization: Secondary | ICD-10-CM

## 2013-09-14 DIAGNOSIS — E119 Type 2 diabetes mellitus without complications: Secondary | ICD-10-CM

## 2013-09-14 DIAGNOSIS — E785 Hyperlipidemia, unspecified: Secondary | ICD-10-CM

## 2013-09-14 DIAGNOSIS — M129 Arthropathy, unspecified: Secondary | ICD-10-CM

## 2013-09-14 DIAGNOSIS — C50919 Malignant neoplasm of unspecified site of unspecified female breast: Secondary | ICD-10-CM

## 2013-09-14 DIAGNOSIS — I10 Essential (primary) hypertension: Secondary | ICD-10-CM

## 2013-09-14 DIAGNOSIS — M1990 Unspecified osteoarthritis, unspecified site: Secondary | ICD-10-CM

## 2013-09-14 NOTE — Patient Instructions (Signed)
prevnar today (pneumonia shot) Bring me a copy of your advanced directive. Return in 6 months for labwork and afterwards office visit to recheck diabetes. Good to see you today, call us with questions.

## 2013-09-14 NOTE — Assessment & Plan Note (Signed)
Chronic, stable. Diet controlled. rtc 6 mo DM f/u.

## 2013-09-14 NOTE — Assessment & Plan Note (Signed)
Reviewed labwork. Stable. Encouraged good hydration status and avoiding NSAIDs.

## 2013-09-14 NOTE — Assessment & Plan Note (Signed)
Followed regularly by surgery Mammogram pending later this year.  Did not tolerate evista.

## 2013-09-14 NOTE — Assessment & Plan Note (Signed)
On tylenol prn for this. Avoid NSAIDs 2/2 CKD.

## 2013-09-14 NOTE — Addendum Note (Signed)
Addended by: Royann Shivers A on: 09/14/2013 11:29 AM   Modules accepted: Orders

## 2013-09-14 NOTE — Assessment & Plan Note (Signed)
Chronic, stable. Continue current regimen. 

## 2013-09-14 NOTE — Progress Notes (Signed)
BP 116/78  Pulse 56  Temp(Src) 98 F (36.7 C) (Oral)  Ht 5' 5.5" (1.664 m)  Wt 158 lb 8 oz (71.895 kg)  BMI 25.97 kg/m2   CC: medicare wellness visit  Subjective:    Patient ID: Paige Medina, female    DOB: 15-Jan-1934, 80 y.o.   MRN: [id number]  HPI: Paige Medina is a 80 y.o. female presenting on 09/14/2013 for Annual Exam   Passes hearing and vision screens today Denies depression,sadness,anhedonia, falls in last year.  Preventative:  COLONOSCOPY Date: 2011 WNL, rec rpt 5 yrs [name]). Hesitant to repeat colonoscopy - would agree to stool kit Well woman - no recent pap smear, s/p hysterectomy 1977. Mammogram 10/2012 - scheduled by Dr. Jamal Collin. H/o breast exam followed closely by Dr. Jamal Collin Dexa - 06/2013 - WNL Flu - yearly  Pneumovax 06/2005. prevnar today Td 06/2005 zostavax 04/2005 Advanced directives: has at home. HCPOA would be daughter.  Lives with husband, no pets  Grown children  Occupation: retired, varied jobs Veterinary surgeon  Activity: gym 3d/wk, active mentally with crosswords  Diet: good water, fruits/vegetables daily   Relevant past medical, surgical, family and social history reviewed and updated as indicated.  Allergies and medications reviewed and updated. Current Outpatient Prescriptions on File Prior to Visit  Medication Sig  . Ascorbic Acid (VITAMIN C PO) Take by mouth as needed.  . beta carotene w/minerals (OCUVITE) tablet Take 1 tablet by mouth daily.  . Calcium Carbonate-Vitamin D (CALCIUM 600 + D PO) Take 1 tablet by mouth 2 (two) times daily.   . Coenzyme Q10 (COQ-10 PO) Take 1 capsule by mouth 3 (three) times a week.   Mariane Baumgarten Calcium (STOOL SOFTENER PO) Take 1 capsule by mouth daily.   [name] estradiol (ESTRACE) 0.1 MG/GM vaginal cream Place 1 Applicatorful vaginally 2 (two) times a week.  . metoprolol succinate (TOPROL-XL) 50 MG 24 hr tablet Take 25 mg by mouth daily. Take with or immediately following a meal.  . Multiple Vitamins-Minerals  (CENTRUM SILVER PO) Take 1 tablet by mouth 2 (two) times a week.   . olmesartan-hydrochlorothiazide (BENICAR HCT) 20-12.5 MG per tablet Take 1 tablet by mouth daily.  . Probiotic Product (PROBIOTIC DAILY PO) Take 1 capsule by mouth daily.   . simvastatin (ZOCOR) 40 MG tablet Take 40 mg by mouth daily.   No current facility-administered medications on file prior to visit.    Review of Systems Per HPI unless specifically indicated above    Objective:    BP 116/78  Pulse 56  Temp(Src) 98 F (36.7 C) (Oral)  Ht 5' 5.5" (1.664 m)  Wt 158 lb 8 oz (71.895 kg)  BMI 25.97 kg/m2  Physical Exam  Nursing note and vitals reviewed. Constitutional: She is oriented to person, place, and time. She appears well-developed and well-nourished. No distress.  HENT:  Head: Normocephalic and atraumatic.  Right Ear: Hearing, tympanic membrane, external ear and ear canal normal.  Left Ear: Hearing, tympanic membrane, external ear and ear canal normal.  Nose: Nose normal.  Mouth/Throat: Uvula is midline, oropharynx is clear and moist and mucous membranes are normal. No oropharyngeal exudate, posterior oropharyngeal edema or posterior oropharyngeal erythema.  Eyes: Conjunctivae and EOM are normal. Pupils are equal, round, and reactive to light. No scleral icterus.  Neck: Normal range of motion. Neck supple. Carotid bruit is not present. No thyromegaly present.  Cardiovascular: Normal rate, regular rhythm, normal heart sounds and intact distal pulses.   No murmur heard. Pulses:  Radial pulses are 2+ on the right side, and 2+ on the left side.  Pulmonary/Chest: Effort normal and breath sounds normal. No respiratory distress. She has no wheezes. She has no rales.  Abdominal: Soft. Bowel sounds are normal. She exhibits no distension and no mass. There is no tenderness. There is no rebound and no guarding.  Musculoskeletal: Normal range of motion. She exhibits no edema.  Lymphadenopathy:    She has no  cervical adenopathy.  Neurological: She is alert and oriented to person, place, and time.  CN grossly intact, station and gait intact Recall 3/3 Calculation 5/5 serial 7s  Skin: Skin is warm and dry. No rash noted.  Psychiatric: She has a normal mood and affect. Her behavior is normal. Judgment and thought content normal.   Results for orders placed in visit on 09/14/13  HM COLONOSCOPY      Result Value Ref Range   HM Colonoscopy rpt 5 yrs        Assessment & Plan:   Problem List Items Addressed This Visit   Medicare annual wellness visit, subsequent - Primary     I have personally reviewed the Medicare Annual Wellness questionnaire and have noted 1. The patient's medical and social history 2. Their use of alcohol, tobacco or illicit drugs 3. Their current medications and supplements 4. The patient's functional ability including ADL's, fall risks, home safety risks and hearing or visual impairment. 5. Diet and physical activity 6. Evidence for depression or mood disorders The patients weight, height, BMI have been recorded in the chart.  Hearing and vision has been addressed. I have made referrals, counseling and provided education to the patient based review of the above and I have provided the pt with a written personalized care plan for preventive services. Provider list updated - see scanned questionairre. Advanced directives discussed:I've asked her to bring me a copy of living will. Daughter is HCPOA.  Reviewed preventative protocols and updated unless pt declined.    Hypertension     Chronic, stable. Continue current regimen.    Hyperlipidemia     Chronic, stable. Continue regimen of simvastatin.    Diabetes mellitus without complication     Chronic, stable. Diet controlled. rtc 6 mo DM f/u.    CKD (chronic kidney disease)     Reviewed labwork. Stable. Encouraged good hydration status and avoiding NSAIDs.    Breast cancer     Followed regularly by  surgery Mammogram pending later this year.  Did not tolerate evista.    Arthritis     On tylenol prn for this. Avoid NSAIDs 2/2 CKD.        Follow up plan: Return in about 6 months (around 03/17/2014), or as needed, for follow up.

## 2013-09-14 NOTE — Progress Notes (Signed)
Pre visit review using our clinic review tool, if applicable. No additional management support is needed unless otherwise documented below in the visit note. 

## 2013-09-14 NOTE — Assessment & Plan Note (Signed)
I have personally reviewed the Medicare Annual Wellness questionnaire and have noted 1. The patient's medical and social history 2. Their use of alcohol, tobacco or illicit drugs 3. Their current medications and supplements 4. The patient's functional ability including ADL's, fall risks, home safety risks and hearing or visual impairment. 5. Diet and physical activity 6. Evidence for depression or mood disorders The patients weight, height, BMI have been recorded in the chart.  Hearing and vision has been addressed. I have made referrals, counseling and provided education to the patient based review of the above and I have provided the pt with a written personalized care plan for preventive services. Provider list updated - see scanned questionairre. Advanced directives discussed:I've asked her to bring me a copy of living will. Daughter is HCPOA.  Reviewed preventative protocols and updated unless pt declined.

## 2013-09-14 NOTE — Assessment & Plan Note (Signed)
Chronic, stable. Continue regimen of simvastatin.

## 2013-09-19 ENCOUNTER — Encounter: Payer: Self-pay | Admitting: Family Medicine

## 2013-09-19 DIAGNOSIS — Z7189 Other specified counseling: Secondary | ICD-10-CM | POA: Insufficient documentation

## 2013-10-10 ENCOUNTER — Other Ambulatory Visit: Payer: Self-pay | Admitting: *Deleted

## 2013-10-10 MED ORDER — SIMVASTATIN 40 MG PO TABS: 40.0000 mg | ORAL_TABLET | Freq: Every day | ORAL | Status: DC

## 2013-10-12 ENCOUNTER — Ambulatory Visit: Payer: Self-pay | Admitting: General Surgery

## 2013-10-17 ENCOUNTER — Encounter: Payer: Self-pay | Admitting: General Surgery

## 2013-10-26 ENCOUNTER — Encounter: Payer: Self-pay | Admitting: General Surgery

## 2013-11-01 ENCOUNTER — Ambulatory Visit (INDEPENDENT_AMBULATORY_CARE_PROVIDER_SITE_OTHER): Payer: Private Health Insurance - Indemnity | Admitting: General Surgery

## 2013-11-01 ENCOUNTER — Encounter: Payer: Self-pay | Admitting: General Surgery

## 2013-11-01 VITALS — BP 148/68 | HR 62 | Resp 12 | Ht 65.0 in

## 2013-11-01 DIAGNOSIS — C50919 Malignant neoplasm of unspecified site of unspecified female breast: Secondary | ICD-10-CM

## 2013-11-01 DIAGNOSIS — C50912 Malignant neoplasm of unspecified site of left female breast: Secondary | ICD-10-CM

## 2013-11-01 NOTE — Patient Instructions (Addendum)
The patient has been asked to return to the office in one year with a bilateral mammogram.
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
## 2013-11-02 ENCOUNTER — Telehealth: Payer: Self-pay | Admitting: *Deleted

## 2013-11-02 LAB — CANCER ANTIGEN 27.29: CA 27.29: 22 U/mL (ref 0.0–38.6)

## 2013-11-02 NOTE — Progress Notes (Signed)
Quick Note:  Inform pt labs are normal. F/u as scheduled ______ 

## 2013-11-02 NOTE — Telephone Encounter (Signed)
Message copied by Carson Myrtle on Thu Nov 02, 2013  1:07 PM ------      Message from: Christene Lye      Created: Thu Nov 02, 2013 12:01 PM       Inform pt labs are normal. F/u as scheduled ------

## 2013-11-02 NOTE — Telephone Encounter (Signed)
Notified patient as instructed, patient pleased. Discussed follow-up in August of 2016, patient agrees.

## 2013-11-03 ENCOUNTER — Encounter: Payer: Self-pay | Admitting: General Surgery

## 2013-11-14 ENCOUNTER — Encounter: Payer: Self-pay | Admitting: Internal Medicine

## 2013-12-06 ENCOUNTER — Ambulatory Visit (INDEPENDENT_AMBULATORY_CARE_PROVIDER_SITE_OTHER): Payer: Medicare HMO

## 2013-12-06 DIAGNOSIS — Z23 Encounter for immunization: Secondary | ICD-10-CM

## 2013-12-12 ENCOUNTER — Encounter: Payer: Self-pay | Admitting: Family Medicine

## 2013-12-12 ENCOUNTER — Ambulatory Visit (INDEPENDENT_AMBULATORY_CARE_PROVIDER_SITE_OTHER): Payer: Medicare HMO | Admitting: Family Medicine

## 2013-12-12 VITALS — BP 130/74 | HR 64 | Temp 98.5°F | Wt 157.8 lb

## 2013-12-12 DIAGNOSIS — I10 Essential (primary) hypertension: Secondary | ICD-10-CM

## 2013-12-12 MED ORDER — LOSARTAN POTASSIUM-HCTZ 50-12.5 MG PO TABS: 1.0000 | ORAL_TABLET | Freq: Every day | ORAL | Status: DC

## 2013-12-12 NOTE — Progress Notes (Signed)
Pre visit review using our clinic review tool, if applicable. No additional management support is needed unless otherwise documented below in the visit note. 

## 2013-12-12 NOTE — Assessment & Plan Note (Signed)
Med change needed for insurance purposes. Will change benicar hctz to losartan hctz 50/12.5mg  daily. Pt checked - losartan is tier 1. Pt will monitor bp closely with change in meds, then will update me if bp uncontrolled. Pt agrees with plan.

## 2013-12-12 NOTE — Patient Instructions (Signed)
Let's stop benicar hct for now, try losartan hctz 50/12.5mg  once daily (at same time as you currently take benicar.)  Continue metoprolol succinate 25mg  daily. Monitor blood pressures more frequently while we try new medicine.

## 2013-12-12 NOTE — Progress Notes (Signed)
   BP 130/74  Pulse 64  Temp(Src) 98.5 F (36.9 C) (Oral)  Wt 157 lb 12 oz (71.555 kg)   CC: med concerns  Subjective:    Patient ID: Paige Medina, female    DOB: 18-Jun-1933, 80 y.o.   MRN: [id number]  HPI: TRISTIAN BOUSKA is a 80 y.o. female presenting on 12/12/2013 for Follow-up   BP regimen - metoprolol succinate 50mg  1/2 tablet daily and benicar hct 20/12.5mg  once daily. Insurance has changed, benicar hct now tier 4. Micardis caused dull headache.  Has tried benazepril, bisoprolol/hctz, felodipine, enalapril, diovan, amlodipine, exforge, clonidine (catapres), metoprolol, hctz, azor, micardis, and atenolol in past.  She has not tried losartan hctz in the past.   Relevant past medical, surgical, family and social history reviewed and updated as indicated.  Allergies and medications reviewed and updated. Current Outpatient Prescriptions on File Prior to Visit  Medication Sig  . Ascorbic Acid (VITAMIN C PO) Take by mouth as needed.  . beta carotene w/minerals (OCUVITE) tablet Take 1 tablet by mouth daily.  . Calcium Carbonate-Vitamin D (CALCIUM 600 + D PO) Take 1 tablet by mouth 2 (two) times daily.   . Coenzyme Q10 (COQ-10 PO) Take 1 capsule by mouth 3 (three) times a week.   Mariane Baumgarten Calcium (STOOL SOFTENER PO) Take 1 capsule by mouth daily.   [name] estradiol (ESTRACE) 0.1 MG/GM vaginal cream Place 1 Applicatorful vaginally 2 (two) times a week.  . Glucosamine-Chondroit-Vit C-Mn (GLUCOSAMINE 1500 COMPLEX PO) Take 1 capsule by mouth daily.  . metoprolol succinate (TOPROL-XL) 50 MG 24 hr tablet Take 25 mg by mouth daily. Take with or immediately following a meal.  . Multiple Vitamins-Minerals (CENTRUM SILVER PO) Take 1 tablet by mouth 2 (two) times a week.   . olmesartan-hydrochlorothiazide (BENICAR HCT) 20-12.5 MG per tablet Take 1 tablet by mouth daily.  . Omega-3 Fatty Acids (FISH OIL) 1200 MG CAPS Take 1 capsule by mouth daily.  . Potassium 75 MG TABS Take by mouth daily.    . Probiotic Product (PROBIOTIC DAILY PO) Take 1 capsule by mouth daily.   [name] pyridOXINE (VITAMIN B-6) 100 MG tablet Take 100 mg by mouth daily.  . simvastatin (ZOCOR) 40 MG tablet Take 1 tablet (40 mg total) by mouth at bedtime.  . Zinc Sulfate (ZINC 15 PO) Take by mouth daily.   No current facility-administered medications on file prior to visit.    Review of Systems Per HPI unless specifically indicated above    Objective:    BP 130/74  Pulse 64  Temp(Src) 98.5 F (36.9 C) (Oral)  Wt 157 lb 12 oz (71.555 kg)  Physical Exam  Nursing note and vitals reviewed. Constitutional: She appears well-developed and well-nourished. No distress.  Psychiatric: She has a normal mood and affect.       Assessment & Plan:   Problem List Items Addressed This Visit   Hypertension - Primary     Med change needed for insurance purposes. Will change benicar hctz to losartan hctz 50/12.5mg  daily. Pt checked - losartan is tier 1. Pt will monitor bp closely with change in meds, then will update me if bp uncontrolled. Pt agrees with plan.    Relevant Medications      LOSARTAN POTASSIUM-HCTZ 50-12.5 MG PO TABS       Follow up plan: Return as needed.

## 2014-01-01 ENCOUNTER — Encounter: Payer: Self-pay | Admitting: Family Medicine

## 2014-02-04 ENCOUNTER — Other Ambulatory Visit: Payer: Self-pay | Admitting: Family Medicine

## 2014-02-21 ENCOUNTER — Ambulatory Visit (INDEPENDENT_AMBULATORY_CARE_PROVIDER_SITE_OTHER): Payer: Medicare HMO | Admitting: Internal Medicine

## 2014-02-21 ENCOUNTER — Encounter: Payer: Self-pay | Admitting: Internal Medicine

## 2014-02-21 VITALS — BP 128/84 | HR 53 | Temp 97.8°F | Wt 157.0 lb

## 2014-02-21 DIAGNOSIS — R300 Dysuria: Secondary | ICD-10-CM

## 2014-02-21 DIAGNOSIS — B3731 Acute candidiasis of vulva and vagina: Secondary | ICD-10-CM

## 2014-02-21 DIAGNOSIS — B373 Candidiasis of vulva and vagina: Secondary | ICD-10-CM

## 2014-02-21 LAB — POCT URINALYSIS DIPSTICK
BILIRUBIN UA: NEGATIVE
Blood, UA: NEGATIVE
Glucose, UA: NEGATIVE
Ketones, UA: NEGATIVE
Nitrite, UA: NEGATIVE
Protein, UA: NEGATIVE
Spec Grav, UA: 1.015
Urobilinogen, UA: NEGATIVE
pH, UA: 6

## 2014-02-21 MED ORDER — FLUCONAZOLE 150 MG PO TABS: 150.0000 mg | ORAL_TABLET | Freq: Once | ORAL | Status: DC

## 2014-02-21 MED ORDER — CEPHALEXIN 500 MG PO CAPS: 500.0000 mg | ORAL_CAPSULE | Freq: Three times a day (TID) | ORAL | Status: DC

## 2014-02-21 NOTE — Progress Notes (Signed)
HPI  Pt presents to the clinic today with c/o dysuria and lower abdominal cramping. She reports this started 2 days ago. She denies fever, chills, nausea or low back pain. She has not tried anything OTC. She has had UTI's in the past. She has had some vaginal itching. She denies vaginal bleeding.   Review of Systems  Past Medical History  Diagnosis Date  . Arthritis 2004  . Diabetes mellitus without complication 3532    diet controlled  . Hypertension 1992  . Heart murmur 1985  . Hyperlipidemia 2012  . Benign colonic polyp   . Rosacea   . 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 Cancer Maternal Grandfather     lung cancer smoker  . CAD Father 37  . CAD Mother 26  . Stroke Paternal Grandfather   . Hypertension Mother   . Diabetes Mother   . Hypertension Father   . Kidney disease Mother     History   Social History  . Marital Status: Married    Spouse Name: N/A    Number of Children: N/A  . Years of Education: N/A   Occupational History  . Not on file.   Social History Main Topics  . Smoking status: Never Smoker   . Smokeless tobacco: Never Used     Comment: + second hand smoker exposure  . Alcohol Use: Yes     Comment: Rare  . Drug Use: No  . Sexual Activity: Not on file   Other Topics Concern  . Not on file   Social History Narrative   Lives with husband, no pets   Grown children   Occupation: retired, varied jobs Veterinary surgeon   Activity: gym 3d/wk, active mentally with crosswords   Diet: good water, fruits/vegetables daily      Advanced directives: pt states HCPOA is daughter but on actual HCPOA form Taraya Steward is listed first. Advanced directive scanned into chart.    Allergies  Allergen Reactions  . Lidocaine Other (See Comments)    Blisters  . Lipitor [Atorvastatin] Other (See Comments)    myalgias  . Metronidazole      REACTION: Nausea/vomiting  . Tape Other (See Comments)    Blisters--(Paper or cloth tape okay)    Constitutional: Denies fever, malaise, fatigue, headache or abrupt weight changes.   GU: Pt reports vaginal itching and pain with urination. Denies burning sensation, blood in urine, odor or discharge. Skin: Denies redness, rashes, lesions or ulcercations.   No other specific complaints in a complete review of systems (except as listed in HPI above).    Objective:   Physical Exam  BP 128/84 mmHg  Pulse 53  Temp(Src) 97.8 F (36.6 C) (Oral)  Wt 157 lb (71.215 kg)  SpO2 99% Wt Readings from Last 3 Encounters:  02/21/14 157 lb (71.215 kg)  12/12/13 157 lb 12 oz (71.555 kg)  09/14/13 158 lb 8 oz (71.895 kg)    General: Appears her stated age, well developed, well nourished in NAD. Cardiovascular: Normal rate and rhythm. S1,S2 noted.  No murmur, rubs or gallops noted.  Pulmonary/Chest: Normal effort and positive vesicular breath sounds. No respiratory distress. No wheezes, rales or ronchi noted.  Abdomen: Soft and nontender. Normal bowel sounds, no bruits noted. No distention or masses noted. Liver, spleen and kidneys non palpable. No CVA tenderness. GU: Normal female anatomy.  Redness and irritation noted of the external labia majora.     Assessment & Plan:    Dysuria, vaginal irritation and itching  Urinalysis: 1+ leuks Will send urine culture Print Rx sent if for Keflex 500 mg TID x 5 days if symptoms get worse over the weekend  OK to take AZO OTC Drink plenty of fluids ? Yeast- will send in RX for Diflucan to take now  RTC as needed or if symptoms persist.

## 2014-02-21 NOTE — Progress Notes (Signed)
Pre visit review using our clinic review tool, if applicable. No additional management support is needed unless otherwise documented below in the visit note. 

## 2014-02-21 NOTE — Patient Instructions (Signed)

## 2014-02-21 NOTE — Addendum Note (Signed)
Addended by: Lurlean Nanny on: 02/21/2014 04:54 PM   Modules accepted: Orders

## 2014-02-23 LAB — URINE CULTURE
Colony Count: NO GROWTH
Organism ID, Bacteria: NO GROWTH

## 2014-03-12 ENCOUNTER — Other Ambulatory Visit: Payer: Self-pay | Admitting: Family Medicine

## 2014-03-12 DIAGNOSIS — E785 Hyperlipidemia, unspecified: Secondary | ICD-10-CM

## 2014-03-12 DIAGNOSIS — I10 Essential (primary) hypertension: Secondary | ICD-10-CM

## 2014-03-12 DIAGNOSIS — E119 Type 2 diabetes mellitus without complications: Secondary | ICD-10-CM

## 2014-03-12 DIAGNOSIS — N183 Chronic kidney disease, stage 3 (moderate): Secondary | ICD-10-CM

## 2014-03-13 ENCOUNTER — Other Ambulatory Visit (INDEPENDENT_AMBULATORY_CARE_PROVIDER_SITE_OTHER): Payer: Medicare HMO

## 2014-03-13 DIAGNOSIS — E119 Type 2 diabetes mellitus without complications: Secondary | ICD-10-CM

## 2014-03-13 DIAGNOSIS — N183 Chronic kidney disease, stage 3 (moderate): Secondary | ICD-10-CM

## 2014-03-13 DIAGNOSIS — E785 Hyperlipidemia, unspecified: Secondary | ICD-10-CM

## 2014-03-13 LAB — LIPID PANEL
CHOL/HDL RATIO: 4
Cholesterol: 156 mg/dL (ref 0–200)
HDL: 38.6 mg/dL — ABNORMAL LOW (ref >39.00)
LDL CALC: 89 mg/dL (ref 0–99)
NONHDL: 117.4
Triglycerides: 144 mg/dL (ref 0.0–149.0)
VLDL: 28.8 mg/dL (ref 0.0–40.0)

## 2014-03-13 LAB — HEMOGLOBIN A1C: Hgb A1c MFr Bld: 7.1 % — ABNORMAL HIGH (ref 4.6–6.5)

## 2014-03-13 LAB — RENAL FUNCTION PANEL
Albumin: 4.1 g/dL (ref 3.5–5.2)
BUN: 21 mg/dL (ref 6–23)
CALCIUM: 9.4 mg/dL (ref 8.4–10.5)
CO2: 30 mEq/L (ref 19–32)
CREATININE: 1.2 mg/dL (ref 0.4–1.2)
Chloride: 103 mEq/L (ref 96–112)
GFR: 44.36 mL/min — ABNORMAL LOW (ref >60.00)
GLUCOSE: 129 mg/dL — AB (ref 70–99)
Phosphorus: 3.7 mg/dL (ref 2.3–4.6)
Potassium: 4.5 mEq/L (ref 3.5–5.1)
Sodium: 139 mEq/L (ref 135–145)

## 2014-03-20 ENCOUNTER — Ambulatory Visit: Payer: Medicare HMO | Admitting: Family Medicine

## 2014-03-20 ENCOUNTER — Ambulatory Visit (INDEPENDENT_AMBULATORY_CARE_PROVIDER_SITE_OTHER): Payer: Medicare HMO | Admitting: Family Medicine

## 2014-03-20 ENCOUNTER — Encounter: Payer: Self-pay | Admitting: Family Medicine

## 2014-03-20 VITALS — BP 114/62 | HR 60 | Temp 98.1°F | Wt 159.0 lb

## 2014-03-20 DIAGNOSIS — E785 Hyperlipidemia, unspecified: Secondary | ICD-10-CM

## 2014-03-20 DIAGNOSIS — I10 Essential (primary) hypertension: Secondary | ICD-10-CM

## 2014-03-20 DIAGNOSIS — E119 Type 2 diabetes mellitus without complications: Secondary | ICD-10-CM

## 2014-03-20 DIAGNOSIS — E1122 Type 2 diabetes mellitus with diabetic chronic kidney disease: Secondary | ICD-10-CM

## 2014-03-20 DIAGNOSIS — N183 Chronic kidney disease, stage 3 (moderate): Secondary | ICD-10-CM

## 2014-03-20 MED ORDER — METOPROLOL SUCCINATE ER 25 MG PO TB24: 12.5000 mg | ORAL_TABLET | Freq: Every day | ORAL | Status: DC

## 2014-03-20 MED ORDER — LOSARTAN POTASSIUM-HCTZ 50-12.5 MG PO TABS: 1.0000 | ORAL_TABLET | Freq: Every day | ORAL | Status: DC

## 2014-03-20 NOTE — Patient Instructions (Addendum)
Blood pressure is looking wonderful! Let's try a bit lower dose of Toprol XL - 25mg  sent in for you to take 1/2 tablet daily. Continue other meds as up to now. We will set you up for diabetes education at [facility]. We will call you to set this up.

## 2014-03-20 NOTE — Assessment & Plan Note (Signed)
Chronic, stable on losartan hctz. Continue current regimen.

## 2014-03-20 NOTE — Assessment & Plan Note (Signed)
Chronic, stable. Remains diet controlled. RTC 6 mo f/u. Foot exam today. Pt has not had diabetes education - will refer for this.

## 2014-03-20 NOTE — Assessment & Plan Note (Signed)
Chronic, stable. Continue current regimen. 

## 2014-03-20 NOTE — Progress Notes (Signed)
BP 114/62 mmHg  Pulse 60  Temp(Src) 98.1 F (36.7 C) (Oral)  Wt 159 lb (72.122 kg)   CC: 6 mo f/u  Subjective:    Patient ID: Paige Medina, female    DOB: 01-23-1934, 81 y.o.   MRN: [id number]  HPI: Paige Medina is a 81 y.o. female presenting on 03/20/2014 for Follow-up   Last visit we changed bp regimen 2/2 insurance formulary changes. We started losartan hctz 50/12.5mg  and pt has tolerated this well. Also on toprol xl 50mg  (1/2 tab daily). Denies low bp sxs of dizziness or syncope. No HA, vision changes, CP/tightness, SOB, leg swelling. Noted bradycardia.   Reviewed CKD stage 3 dx. Reviewed normal renal US 2012 (one renal cyst)  HLD - compliant with simvastatin without myalgias  DM - regularly does not check sugars.  Compliant with antihyperglycemic regimen which includes: diet controlled.  Denies low sugars or hypoglycemic symptoms.  Denies paresthesias. Last diabetic eye exam 03/2013, upcoming appt.  Pneumovax: 06/2005.  Prevnar: 08/2013. Lab Results  Component Value Date   HGBA1C 7.1* 03/13/2014   Diabetic Foot Exam - Simple   Simple Foot Form  Diabetic Foot exam was performed with the following findings:  Yes 03/20/2014  3:23 PM  Visual Inspection  No deformities, no ulcerations, no other skin breakdown bilaterally:  Yes  See comments:  Yes  Sensation Testing  Intact to touch and monofilament testing bilaterally:  Yes  Pulse Check  Posterior Tibialis and Dorsalis pulse intact bilaterally:  Yes  Comments  Bilateral great toe onychomycosis       Relevant past medical, surgical, family and social history reviewed and updated as indicated. Interim medical history since our last visit reviewed. Allergies and medications reviewed and updated. Current Outpatient Prescriptions on File Prior to Visit  Medication Sig  . Ascorbic Acid (VITAMIN C PO) Take by mouth as needed.  . beta carotene w/minerals (OCUVITE) tablet Take 1 tablet by mouth daily.  . Calcium  Carbonate-Vitamin D (CALCIUM 600 + D PO) Take 1 tablet by mouth 2 (two) times daily.   . Coenzyme Q10 (COQ-10 PO) Take 1 capsule by mouth 3 (three) times a week.   Mariane Baumgarten Calcium (STOOL SOFTENER PO) Take 1 capsule by mouth daily.   [name] estradiol (ESTRACE) 0.1 MG/GM vaginal cream Place 1 Applicatorful vaginally 2 (two) times a week.  . Omega-3 Fatty Acids (FISH OIL) 1200 MG CAPS Take 1 capsule by mouth daily.  . Potassium 75 MG TABS Take 75 mg by mouth as needed (when having feet/leg cramps).   . Probiotic Product (PROBIOTIC DAILY PO) Take 1 capsule by mouth daily.   . simvastatin (ZOCOR) 40 MG tablet Take 1 tablet (40 mg total) by mouth at bedtime.  . Zinc Sulfate (ZINC 15 PO) Take 15 mg by mouth as needed (with cold symptoms).    No current facility-administered medications on file prior to visit.    Review of Systems Per HPI unless specifically indicated above     Objective:    BP 114/62 mmHg  Pulse 60  Temp(Src) 98.1 F (36.7 C) (Oral)  Wt 159 lb (72.122 kg)  Wt Readings from Last 3 Encounters:  03/20/14 159 lb (72.122 kg)  02/21/14 157 lb (71.215 kg)  12/12/13 157 lb 12 oz (71.555 kg)    Physical Exam  Constitutional: She appears well-developed and well-nourished. No distress.  HENT:  Head: Normocephalic and atraumatic.  Right Ear: External ear normal.  Left Ear: External ear normal.  Nose:  Nose normal.  Mouth/Throat: Oropharynx is clear and moist. No oropharyngeal exudate.  Eyes: Conjunctivae and EOM are normal. Pupils are equal, round, and reactive to light. No scleral icterus.  Neck: Normal range of motion. Neck supple.  Cardiovascular: Normal rate, regular rhythm, normal heart sounds and intact distal pulses.   No murmur heard. Pulmonary/Chest: Effort normal and breath sounds normal. No respiratory distress. She has no wheezes. She has no rales.  Musculoskeletal: She exhibits no edema.  See HPI for foot exam if done  Lymphadenopathy:    She has no cervical  adenopathy.  Skin: Skin is warm and dry. No rash noted.  Psychiatric: She has a normal mood and affect.  Nursing note and vitals reviewed.  Results for orders placed or performed in visit on 03/13/14  Lipid panel  Result Value Ref Range   Cholesterol 156 0 - 200 mg/dL   Triglycerides 144.0 0.0 - 149.0 mg/dL   HDL 38.60 (L) >39.00 mg/dL   VLDL 28.8 0.0 - 40.0 mg/dL   LDL Cholesterol 89 0 - 99 mg/dL   Total CHOL/HDL Ratio 4    NonHDL 117.40   Hemoglobin A1c  Result Value Ref Range   Hgb A1c MFr Bld 7.1 (H) 4.6 - 6.5 %  Renal function panel  Result Value Ref Range   Sodium 139 135 - 145 mEq/L   Potassium 4.5 3.5 - 5.1 mEq/L   Chloride 103 96 - 112 mEq/L   CO2 30 19 - 32 mEq/L   Calcium 9.4 8.4 - 10.5 mg/dL   Albumin 4.1 3.5 - 5.2 g/dL   BUN 21 6 - 23 mg/dL   Creatinine, Ser 1.2 0.4 - 1.2 mg/dL   Glucose, Bld 129 (H) 70 - 99 mg/dL   Phosphorus 3.7 2.3 - 4.6 mg/dL   GFR 44.36 (L) >60.00 mL/min      Assessment & Plan:   Problem List Items Addressed This Visit    Hypertension    Chronic, stable on losartan hctz. Continue current regimen.      Relevant Medications   losartan-hydrochlorothiazide (HYZAAR) 50-12.5 MG per tablet   metoprolol succinate (TOPROL-XL) 24 hr tablet   Hyperlipidemia    Chronic, stable. Continue current regimen.      Relevant Medications   losartan-hydrochlorothiazide (HYZAAR) 50-12.5 MG per tablet   metoprolol succinate (TOPROL-XL) 24 hr tablet   Diabetes mellitus without complication - Primary    Chronic, stable. Remains diet controlled. RTC 6 mo f/u. Foot exam today. Pt has not had diabetes education - will refer for this.      Relevant Medications   losartan-hydrochlorothiazide (HYZAAR) 50-12.5 MG per tablet   Other Relevant Orders   Ambulatory referral to diabetic education   CKD stage 3 due to type 2 diabetes mellitus    Reviewed dx. Continue good hydration status and avoiding nephrotoxic medications.      Relevant Medications    losartan-hydrochlorothiazide (HYZAAR) 50-12.5 MG per tablet       Follow up plan: Return in about 6 months (around 09/18/2014), or as needed, for medicare wellness.

## 2014-03-20 NOTE — Assessment & Plan Note (Signed)
Reviewed dx. Continue good hydration status and avoiding nephrotoxic medications.

## 2014-03-20 NOTE — Progress Notes (Signed)
Pre visit review using our clinic review tool, if applicable. No additional management support is needed unless otherwise documented below in the visit note. 

## 2014-03-27 ENCOUNTER — Other Ambulatory Visit: Payer: Self-pay | Admitting: Family Medicine

## 2014-04-20 ENCOUNTER — Encounter: Payer: Self-pay | Admitting: Family Medicine

## 2014-04-20 LAB — HM DIABETES EYE EXAM

## 2014-05-15 ENCOUNTER — Ambulatory Visit
Admit: 2014-05-15 | Disposition: A | Discharge status: Home / Self Care | Payer: Self-pay | Attending: Family Medicine | Admitting: Family Medicine

## 2014-05-28 ENCOUNTER — Encounter: Payer: Self-pay | Admitting: Family Medicine

## 2014-06-01 ENCOUNTER — Ambulatory Visit
Admit: 2014-06-01 | Disposition: A | Discharge status: Home / Self Care | Payer: Self-pay | Attending: Family Medicine | Admitting: Family Medicine

## 2014-08-29 ENCOUNTER — Other Ambulatory Visit: Payer: Self-pay

## 2014-08-29 DIAGNOSIS — C50912 Malignant neoplasm of unspecified site of left female breast: Secondary | ICD-10-CM

## 2014-09-19 ENCOUNTER — Encounter: Payer: Self-pay | Admitting: Internal Medicine

## 2014-09-24 ENCOUNTER — Other Ambulatory Visit: Payer: Self-pay | Admitting: Family Medicine

## 2014-09-24 DIAGNOSIS — E119 Type 2 diabetes mellitus without complications: Secondary | ICD-10-CM

## 2014-09-26 ENCOUNTER — Other Ambulatory Visit (INDEPENDENT_AMBULATORY_CARE_PROVIDER_SITE_OTHER): Payer: Medicare HMO

## 2014-09-26 DIAGNOSIS — E119 Type 2 diabetes mellitus without complications: Secondary | ICD-10-CM

## 2014-09-26 LAB — COMPREHENSIVE METABOLIC PANEL
ALK PHOS: 70 U/L (ref 39–117)
ALT: 24 U/L (ref 0–35)
AST: 21 U/L (ref 0–37)
Albumin: 4.2 g/dL (ref 3.5–5.2)
BILIRUBIN TOTAL: 0.5 mg/dL (ref 0.2–1.2)
BUN: 17 mg/dL (ref 6–23)
CALCIUM: 9.9 mg/dL (ref 8.4–10.5)
CO2: 31 mEq/L (ref 19–32)
Chloride: 103 mEq/L (ref 96–112)
Creatinine, Ser: 1.19 mg/dL (ref 0.40–1.20)
GFR: 46.46 mL/min — AB (ref >60.00)
Glucose, Bld: 131 mg/dL — ABNORMAL HIGH (ref 70–99)
POTASSIUM: 4.8 meq/L (ref 3.5–5.1)
Sodium: 141 mEq/L (ref 135–145)
TOTAL PROTEIN: 7.7 g/dL (ref 6.0–8.3)

## 2014-09-26 LAB — LIPID PANEL
Cholesterol: 163 mg/dL (ref 0–200)
HDL: 44.4 mg/dL (ref >39.00)
LDL Cholesterol: 83 mg/dL (ref 0–99)
NonHDL: 118.6
TRIGLYCERIDES: 177 mg/dL — AB (ref 0.0–149.0)
Total CHOL/HDL Ratio: 4
VLDL: 35.4 mg/dL (ref 0.0–40.0)

## 2014-09-26 LAB — HEMOGLOBIN A1C: Hgb A1c MFr Bld: 6.5 % (ref 4.6–6.5)

## 2014-10-02 ENCOUNTER — Encounter: Payer: Self-pay | Admitting: Family Medicine

## 2014-10-02 ENCOUNTER — Encounter: Payer: Self-pay | Admitting: Internal Medicine

## 2014-10-02 ENCOUNTER — Ambulatory Visit (INDEPENDENT_AMBULATORY_CARE_PROVIDER_SITE_OTHER): Payer: Medicare HMO | Admitting: Family Medicine

## 2014-10-02 VITALS — BP 132/66 | HR 60 | Temp 98.1°F | Ht 65.5 in | Wt 153.8 lb

## 2014-10-02 DIAGNOSIS — N183 Chronic kidney disease, stage 3 unspecified: Secondary | ICD-10-CM

## 2014-10-02 DIAGNOSIS — E1122 Type 2 diabetes mellitus with diabetic chronic kidney disease: Secondary | ICD-10-CM

## 2014-10-02 DIAGNOSIS — Z7189 Other specified counseling: Secondary | ICD-10-CM

## 2014-10-02 DIAGNOSIS — Z0000 Encounter for general adult medical examination without abnormal findings: Secondary | ICD-10-CM

## 2014-10-02 DIAGNOSIS — E785 Hyperlipidemia, unspecified: Secondary | ICD-10-CM

## 2014-10-02 DIAGNOSIS — E119 Type 2 diabetes mellitus without complications: Secondary | ICD-10-CM

## 2014-10-02 DIAGNOSIS — I10 Essential (primary) hypertension: Secondary | ICD-10-CM | POA: Diagnosis not present

## 2014-10-02 DIAGNOSIS — Z853 Personal history of malignant neoplasm of breast: Secondary | ICD-10-CM

## 2014-10-02 MED ORDER — ESTRADIOL 0.1 MG/GM VA CREA: 1.0000 | TOPICAL_CREAM | VAGINAL | Status: DC

## 2014-10-02 MED ORDER — LOVASTATIN 40 MG PO TABS: 40.0000 mg | ORAL_TABLET | Freq: Every day | ORAL | Status: DC

## 2014-10-02 MED ORDER — COQ-10 100 MG PO CAPS: 1.0000 | ORAL_CAPSULE | Freq: Every day | ORAL | Status: DC

## 2014-10-02 MED ORDER — GLUCOSE BLOOD VI STRP: ORAL_STRIP | Status: DC

## 2014-10-02 NOTE — Assessment & Plan Note (Signed)
Chronic, stable. Continue hyzaar and toprol xl

## 2014-10-02 NOTE — Progress Notes (Signed)
Pre visit review using our clinic review tool, if applicable. No additional management support is needed unless otherwise documented below in the visit note. 

## 2014-10-02 NOTE — Assessment & Plan Note (Signed)
Chronic, stable. Fish oil recently changed to krill oil. Not tolerating simvastatin 2/2 myalgias.  rec daily CoQ10. Will trial lovastatin less potent statin.

## 2014-10-02 NOTE — Patient Instructions (Addendum)
Call GI to follow up on colonoscopy. meds refilled today. Try less potent statin called lovastatin sent to pharmacy. Update me with effect of new statin. Try daily CoQ 10 supplement.  Bring me copy of living will at your convenience.  Return as needed or in 6 months for diabetes follow up.

## 2014-10-02 NOTE — Assessment & Plan Note (Signed)

## 2014-10-02 NOTE — Progress Notes (Signed)
BP 132/66 mmHg  Pulse 60  Temp(Src) 98.1 F (36.7 C) (Oral)  Ht 5' 5.5" (1.664 m)  Wt 153 lb 12 oz (69.741 kg)  BMI 25.19 kg/m2   CC: medicare wellness visit  Subjective:    Patient ID: Paige Medina, female    DOB: September 04, 1933, 81 y.o.   MRN: [id number]  HPI: Paige Medina is a 81 y.o. female presenting on 10/02/2014 for Annual Exam   HLD - did not tolerate lipitor. On simvastatin but persistent muscle aches despite CoQ 10 MWF. Interested in other options.  Passes hearing screen today Vision screen at eye doctor recently. Denies depression,sadness,anhedonia 1 fall in last year. Stepped off curb. No injury.   Preventative:  COLONOSCOPY Date: 2011 WNL, rec rpt 5 yrs [name]). Planning on rescheduling with GI. Well woman - no recent pap smear, s/p hysterectomy 1977. Mammogram 10/2014 pending - scheduled by Dr. Jamal Collin. H/o breast cancer followed closely by Dr. Jamal Collin Dexa - 08/2012 - WNL Flu - yearly Pneumovax 06/2005. prevnar 2015 Td 06/2005 zostavax 04/2005 Advanced directives: HCPOA are daughter and husband Brytni Dray. Does not want prolonged life support. Seat belt use discussed Sunscreen use discussed. No changing moles on skin.  Lives with husband, no pets  Grown children  Occupation: retired, varied jobs Veterinary surgeon  Activity: gym 3d/wk, active mentally with crosswords  Diet: good water, fruits/vegetables daily   Relevant past medical, surgical, family and social history reviewed and updated as indicated. Interim medical history since our last visit reviewed. Allergies and medications reviewed and updated. Current Outpatient Prescriptions on File Prior to Visit  Medication Sig  . Ascorbic Acid (VITAMIN C PO) Take by mouth as needed.  . beta carotene w/minerals (OCUVITE) tablet Take 1 tablet by mouth daily.  . Calcium Carbonate-Vitamin D (CALCIUM 600 + D PO) Take 1 tablet by mouth 2 (two) times daily.   Mariane Baumgarten Calcium (STOOL SOFTENER PO) Take 1 capsule  by mouth daily.   [name] losartan-hydrochlorothiazide (HYZAAR) 50-12.5 MG per tablet Take 1 tablet by mouth daily.  . metoprolol succinate (TOPROL-XL) 25 MG 24 hr tablet Take 0.5 tablets (12.5 mg total) by mouth daily. Take with or immediately following a meal.  . Probiotic Product (PROBIOTIC DAILY PO) Take 1 capsule by mouth daily.   . Zinc Sulfate (ZINC 15 PO) Take 15 mg by mouth as needed (with cold symptoms).    No current facility-administered medications on file prior to visit.    Review of Systems Per HPI unless specifically indicated above     Objective:    BP 132/66 mmHg  Pulse 60  Temp(Src) 98.1 F (36.7 C) (Oral)  Ht 5' 5.5" (1.664 m)  Wt 153 lb 12 oz (69.741 kg)  BMI 25.19 kg/m2  Wt Readings from Last 3 Encounters:  10/02/14 153 lb 12 oz (69.741 kg)  03/20/14 159 lb (72.122 kg)  02/21/14 157 lb (71.215 kg)    Physical Exam  Constitutional: She is oriented to person, place, and time. She appears well-developed and well-nourished. No distress.  HENT:  Head: Normocephalic and atraumatic.  Right Ear: Hearing, tympanic membrane, external ear and ear canal normal.  Left Ear: Hearing, tympanic membrane, external ear and ear canal normal.  Nose: Nose normal.  Mouth/Throat: Uvula is midline, oropharynx is clear and moist and mucous membranes are normal. No oropharyngeal exudate, posterior oropharyngeal edema or posterior oropharyngeal erythema.  Eyes: Conjunctivae and EOM are normal. Pupils are equal, round, and reactive to light. No scleral icterus.  Neck:  Normal range of motion. Neck supple. Carotid bruit is not present. No thyromegaly present.  Cardiovascular: Normal rate, regular rhythm, normal heart sounds and intact distal pulses.   No murmur heard. Pulses:      Radial pulses are 2+ on the right side, and 2+ on the left side.  Pulmonary/Chest: Effort normal and breath sounds normal. No respiratory distress. She has no wheezes. She has no rales.  Abdominal: Soft. Bowel  sounds are normal. She exhibits no distension and no mass. There is no tenderness. There is no rebound and no guarding.  Musculoskeletal: Normal range of motion. She exhibits no edema.  Lymphadenopathy:    She has no cervical adenopathy.  Neurological: She is alert and oriented to person, place, and time.  CN grossly intact, station and gait intact Recall 3/3 Calculation 4/5 serial 7s  Skin: Skin is warm and dry. No rash noted.  Psychiatric: She has a normal mood and affect. Her behavior is normal. Judgment and thought content normal.  Nursing note and vitals reviewed.  Results for orders placed or performed in visit on 09/26/14  Comprehensive metabolic panel  Result Value Ref Range   Sodium 141 135 - 145 mEq/L   Potassium 4.8 3.5 - 5.1 mEq/L   Chloride 103 96 - 112 mEq/L   CO2 31 19 - 32 mEq/L   Glucose, Bld 131 (H) 70 - 99 mg/dL   BUN 17 6 - 23 mg/dL   Creatinine, Ser 1.19 0.40 - 1.20 mg/dL   Total Bilirubin 0.5 0.2 - 1.2 mg/dL   Alkaline Phosphatase 70 39 - 117 U/L   AST 21 0 - 37 U/L   ALT 24 0 - 35 U/L   Total Protein 7.7 6.0 - 8.3 g/dL   Albumin 4.2 3.5 - 5.2 g/dL   Calcium 9.9 8.4 - 10.5 mg/dL   GFR 46.46 (L) >60.00 mL/min  Lipid panel  Result Value Ref Range   Cholesterol 163 0 - 200 mg/dL   Triglycerides 177.0 (H) 0.0 - 149.0 mg/dL   HDL 44.40 >39.00 mg/dL   VLDL 35.4 0.0 - 40.0 mg/dL   LDL Cholesterol 83 0 - 99 mg/dL   Total CHOL/HDL Ratio 4    NonHDL 118.60   Hemoglobin A1c  Result Value Ref Range   Hgb A1c MFr Bld 6.5 4.6 - 6.5 %      Assessment & Plan:   Problem List Items Addressed This Visit    Advanced care planning/counseling discussion    Advanced directives: HCPOA are daughter and husband Laurelyn Terrero. Does not want prolonged life support.      CKD stage 3 due to type 2 diabetes mellitus    Stable.      Relevant Medications   lovastatin (MEVACOR) 40 MG tablet   Diabetes mellitus without complication    Diet controlled. Remains well  controlled. Strips refilled for PRN testing.      Relevant Medications   lovastatin (MEVACOR) 40 MG tablet   History of breast cancer    Sees Dr Jamal Collin yearly. Upcoming appt and mammogram      Hyperlipidemia    Chronic, stable. Fish oil recently changed to krill oil. Not tolerating simvastatin 2/2 myalgias.  rec daily CoQ10. Will trial lovastatin less potent statin.      Relevant Medications   lovastatin (MEVACOR) 40 MG tablet   Hypertension    Chronic, stable. Continue hyzaar and toprol xl      Relevant Medications   lovastatin (MEVACOR) 40 MG tablet  Medicare annual wellness visit, subsequent - Primary    I have personally reviewed the Medicare Annual Wellness questionnaire and have noted 1. The patient's medical and social history 2. Their use of alcohol, tobacco or illicit drugs 3. Their current medications and supplements 4. The patient's functional ability including ADL's, fall risks, home safety risks and hearing or visual impairment. Cognitive function has been assessed and addressed as indicated.  5. Diet and physical activity 6. Evidence for depression or mood disorders The patients weight, height, BMI have been recorded in the chart. I have made referrals, counseling and provided education to the patient based on review of the above and I have provided the pt with a written personalized care plan for preventive services. Provider list updated.. See scanned questionairre as needed for further documentation. Reviewed preventative protocols and updated unless pt declined.           Follow up plan: Return in about 6 months (around 04/04/2015), or as needed, for follow up visit.

## 2014-10-02 NOTE — Assessment & Plan Note (Addendum)
Advanced directives: HCPOA are daughter and husband Ming Kunka. Does not want prolonged life support.

## 2014-10-02 NOTE — Assessment & Plan Note (Signed)
Diet controlled. Remains well controlled. Strips refilled for PRN testing.

## 2014-10-02 NOTE — Assessment & Plan Note (Signed)
Stable

## 2014-10-02 NOTE — Assessment & Plan Note (Signed)
Sees Dr Jamal Collin yearly. Upcoming appt and mammogram

## 2014-10-23 ENCOUNTER — Ambulatory Visit (INDEPENDENT_AMBULATORY_CARE_PROVIDER_SITE_OTHER): Payer: Medicare HMO | Admitting: General Surgery

## 2014-10-23 ENCOUNTER — Other Ambulatory Visit: Payer: Medicare HMO

## 2014-10-23 ENCOUNTER — Encounter: Payer: Self-pay | Admitting: General Surgery

## 2014-10-23 VITALS — BP 144/86 | HR 70 | Resp 14 | Ht 65.75 in | Wt 154.0 lb

## 2014-10-23 DIAGNOSIS — N63 Unspecified lump in breast: Secondary | ICD-10-CM

## 2014-10-23 DIAGNOSIS — C50912 Malignant neoplasm of unspecified site of left female breast: Secondary | ICD-10-CM | POA: Diagnosis not present

## 2014-10-23 DIAGNOSIS — N6320 Unspecified lump in the left breast, unspecified quadrant: Secondary | ICD-10-CM

## 2014-10-23 HISTORY — PX: BREAST BIOPSY: SHX20

## 2014-10-23 NOTE — Patient Instructions (Addendum)
Continue self breast exams. Call office for any new breast issues or concerns.    CARE AFTER BREAST BIOPSY  1. Leave the dressing on that your doctor applied after surgery. It is waterproof. You may bathe, shower and/or swim. The dressing will probably remain intact until your return office visit. If the dressing comes off, you will see small strips of tape against your skin on the incision. Do not remove these strips.  2. You may want to use a gauze,cloth or similar protection in your bra to prevent rubbing against your dressing and incision. This is not necessary, but you may feel more comfortable doing so.  3. It is recommended that you wear a bra day and night to give support to the breast. This will prevent the weight of the breast from pulling on the incision.  4. Your breast will feel hard and lumpy under the incision. Do not be alarmed. This is the underlying stitching of tissue. Softening of this tissue will occur in time.  5. Make sure you call the office and schedule an appointment in one week after your surgery. The office phone number is [phone]. The nurses at [facility] may have already done this for you.  6. You will notice about a week after your office visit that the strips of the tape on your incision will begin to loosen. These may then be removed.  7. Report to your doctor any of the following:  * Severe pain not relieved by your pain medication  *Redness of the incision  * Drainage from the incision  *Fever greater than 101 degrees
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
## 2014-10-24 ENCOUNTER — Encounter: Payer: Self-pay | Admitting: General Surgery

## 2014-10-25 ENCOUNTER — Telehealth: Payer: Self-pay | Admitting: *Deleted

## 2014-10-25 NOTE — Telephone Encounter (Signed)
-----   Message from Christene Lye, MD sent at 10/25/2014 10:58 AM EDT ----- Please let pt pt know the pathology was normal.  Follow up in 3 mos with left mammogram.

## 2014-10-25 NOTE — Telephone Encounter (Signed)
Patient called back.  I gave her results per your message.  She will follow up as instructed.  Patient asked me to relay message that a Tegaderm was used and she is highly allergic. She broke out in rash and removed the Tegaderm.

## 2014-10-26 ENCOUNTER — Ambulatory Visit (INDEPENDENT_AMBULATORY_CARE_PROVIDER_SITE_OTHER): Payer: Medicare HMO | Admitting: Internal Medicine

## 2014-10-26 ENCOUNTER — Encounter: Payer: Self-pay | Admitting: Internal Medicine

## 2014-10-26 VITALS — BP 128/70 | HR 60 | Temp 97.9°F | Wt 156.0 lb

## 2014-10-26 DIAGNOSIS — B372 Candidiasis of skin and nail: Secondary | ICD-10-CM

## 2014-10-26 MED ORDER — KETOCONAZOLE 2 % EX CREA: 1.0000 "application " | TOPICAL_CREAM | Freq: Every day | CUTANEOUS | Status: DC

## 2014-10-26 NOTE — Progress Notes (Signed)
Pre visit review using our clinic review tool, if applicable. No additional management support is needed unless otherwise documented below in the visit note. 

## 2014-10-26 NOTE — Patient Instructions (Signed)

## 2014-10-26 NOTE — Progress Notes (Signed)
Subjective:    Patient ID: Paige Medina, female    DOB: 1933/05/07, 81 y.o.   MRN: [id number]  HPI  Pt presents to the clinic today with c/o vaginal pain in herlabia. This started yesterday. She describes the pain as burning. She denies vaginal discharge or complaints. She denies abdominal or pelvic pain. She has had a hysterectomy and she is not sexually active. She has tried putting Aquaphor on it with minimal relief.  Review of Systems      Past Medical History  Diagnosis Date  . Arthritis 2004  . Diabetes mellitus without complication 4462    diet controlled, DMSE 05/2014  . Hypertension 1992  . Heart murmur 1985  . Hyperlipidemia 2012  . Benign colonic polyp   . Rosacea   . Malignant neoplasm of upper-inner quadrant of female breast 2012    leftbreast, T1 N0 ER/PR positive HER 2 negative    Current Outpatient Prescriptions  Medication Sig Dispense Refill  . acetaminophen (TYLENOL) 500 MG tablet Take 500 mg by mouth as needed.    . Ascorbic Acid (VITAMIN C PO) Take by mouth as needed.    . beta carotene w/minerals (OCUVITE) tablet Take 1 tablet by mouth daily.    . Calcium Carbonate-Vitamin D (CALCIUM 600 + D PO) Take 1 tablet by mouth 2 (two) times daily.     . Coenzyme Q10 (COQ-10) 100 MG CAPS Take 1 capsule by mouth daily.  0  . Docusate Calcium (STOOL SOFTENER PO) Take 1 capsule by mouth daily.     [name] estradiol (ESTRACE) 0.1 MG/GM vaginal cream Place 1 Applicatorful vaginally 2 (two) times a week. 42.5 g 3  . glucose blood test strip Use as instructed 100 each 3  . Krill Oil 1000 MG CAPS Take 1 capsule by mouth daily.    [name] losartan-hydrochlorothiazide (HYZAAR) 50-12.5 MG per tablet Take 1 tablet by mouth daily. 90 tablet 3  . lovastatin (MEVACOR) 40 MG tablet Take 1 tablet (40 mg total) by mouth at bedtime. 30 tablet 11  . metoprolol succinate (TOPROL-XL) 25 MG 24 hr tablet Take 0.5 tablets (12.5 mg total) by mouth daily. Take with or immediately following a meal.  45 tablet 3  . Probiotic Product (PROBIOTIC DAILY PO) Take 1 capsule by mouth daily.     . Zinc Sulfate (ZINC 15 PO) Take 15 mg by mouth as needed (with cold symptoms).     [name] ketoconazole (NIZORAL) 2 % cream Apply 1 application topically daily. 15 g 0   No current facility-administered medications for this visit.    Allergies  Allergen Reactions  . Ciprofloxacin Nausea Only  . Epinephrine Other (See Comments)    Blisters, confirmed(2012) by Dr. Adolph Pollack since pt last visit couldn't remember  . Lipitor [Atorvastatin] Other (See Comments)    myalgias  . Tape Other (See Comments)    Blisters--(Paper or cloth tape okay)  . Tegaderm Ag Mesh [Silver] Rash    Family History  Problem Relation Age of Onset  . Cancer Maternal Grandfather     lung cancer smoker  . CAD Father 69  . CAD Mother 5  . Stroke Paternal Grandfather   . Hypertension Mother   . Diabetes Mother   . Hypertension Father   . Kidney disease Mother     Social History   Social History  . Marital Status: Married    Spouse Name: N/A  . Number of Children: N/A  . Years of Education: N/A   Occupational  History  . Not on file.   Social History Main Topics  . Smoking status: Never Smoker   . Smokeless tobacco: Never Used     Comment: + second hand smoker exposure  . Alcohol Use: Yes     Comment: Rare  . Drug Use: No  . Sexual Activity: Not on file   Other Topics Concern  . Not on file   Social History Narrative   Lives with husband, no pets   Grown children   Occupation: retired, varied jobs Veterinary surgeon   Activity: gym 3d/wk, active mentally with crosswords   Diet: good water, fruits/vegetables daily     Constitutional: Denies fever, malaise, fatigue, headache or abrupt weight changes.  Respiratory: Denies difficulty breathing, shortness of breath, cough or sputum production.   Cardiovascular: Denies chest pain, chest tightness, palpitations or swelling in the hands or feet.  GU: Denies urgency,  frequency, pain with urination, blood in urine, odor or discharge.  No other specific complaints in a complete review of systems (except as listed in HPI above).  Objective:   Physical Exam  BP 128/70 mmHg  Pulse 60  Temp(Src) 97.9 F (36.6 C) (Oral)  Wt 156 lb (70.761 kg)  SpO2 99% Wt Readings from Last 3 Encounters:  10/26/14 156 lb (70.761 kg)  10/23/14 154 lb (69.854 kg)  10/02/14 153 lb 12 oz (69.741 kg)    General: Appears her stated age, in NAD. Cardiovascular: Normal rate and rhythm. S1,S2 noted.  No murmur, rubs or gallops noted.  Pulmonary/Chest: Normal effort and positive vesicular breath sounds. No respiratory distress. No wheezes, rales or ronchi noted.  Abdomen: Soft and nontender.  Pelvic: Normal female anatomy. No discharge noted. Labia is inflamed bilaterally.  BMET    Component Value Date/Time   NA 141 09/26/2014 0820   K 4.8 09/26/2014 0820   CL 103 09/26/2014 0820   CO2 31 09/26/2014 0820   GLUCOSE 131* 09/26/2014 0820   BUN 17 09/26/2014 0820   CREATININE 1.19 09/26/2014 0820   CREATININE 1.10 03/15/2013   CALCIUM 9.9 09/26/2014 0820   CALCIUM 10.0 03/15/2013    Lipid Panel     Component Value Date/Time   CHOL 163 09/26/2014 0820   TRIG 177.0* 09/26/2014 0820   TRIG 243 03/15/2013   HDL 44.40 09/26/2014 0820   CHOLHDL 4 09/26/2014 0820   VLDL 35.4 09/26/2014 0820   LDLCALC 83 09/26/2014 0820   LDLCALC 50 03/15/2013    CBC No results found for: WBC, RBC, HGB, HCT, PLT, MCV, MCH, MCHC, RDW, LYMPHSABS, MONOABS, EOSABS, BASOSABS  Hgb A1C Lab Results  Component Value Date   HGBA1C 6.5 09/26/2014         Assessment & Plan:   Yeast infection of the skin of the labia:  Discussed avoiding irritation by wearing pads and scrubbing dry after bathing Advised her to pat dry instead of rub eRx for Ketoconazole cream to affected area daily  RTC as needed or if symptoms persist or worsen

## 2014-10-29 ENCOUNTER — Telehealth: Payer: Self-pay | Admitting: *Deleted

## 2014-10-29 NOTE — Telephone Encounter (Signed)
Ok, she should let me know if it gets worse

## 2014-10-29 NOTE — Telephone Encounter (Signed)
Pt is aware as instructed 

## 2014-10-29 NOTE — Telephone Encounter (Signed)
Patient left a voicemail stating that she saw you Friday for an infection. Patient stated that there is less redness, still a little sticky, but is better.

## 2014-10-30 ENCOUNTER — Encounter: Payer: Self-pay | Admitting: Family Medicine

## 2014-11-23 ENCOUNTER — Ambulatory Visit (AMBULATORY_SURGERY_CENTER): Payer: Self-pay | Admitting: *Deleted

## 2014-11-23 VITALS — Ht 65.75 in | Wt 158.0 lb

## 2014-11-23 DIAGNOSIS — Z86010 Personal history of colonic polyps: Secondary | ICD-10-CM

## 2014-11-23 NOTE — Progress Notes (Signed)
Denies allergies to eggs or soy products. Denies complications with sedation or anesthesia. Denies O2 use. Denies use of diet or weight loss medications.  Emmi instructions given for colonoscopy.  

## 2014-12-01 HISTORY — PX: COLONOSCOPY: SHX174

## 2014-12-03 ENCOUNTER — Telehealth: Payer: Self-pay | Admitting: General Surgery

## 2014-12-03 NOTE — Telephone Encounter (Signed)
PT CALLED TODAY & IS SCH'D CURRENTLY FOR A MAMMO & OFC  APPT IN November 2016. HER LAST MAMMO(BILAT)DONE 10-16-14 @ [place] 10-23-14.THE PLAN FROM 10-23-14 WAS Core biopsy recommended and completed with consent today. If path is benign can return to 1 yr follow up.  DOES PT JUST NEED AND APPT/AND MAMMO?  (NOTE: IF PT NEEDS TO COME SHE WILL NEED NEW DATES.

## 2014-12-04 NOTE — Telephone Encounter (Signed)
She does need her appt in November as scheduled.

## 2014-12-05 NOTE — Telephone Encounter (Signed)
12-05-14 @ 9:13AM L/M WITH HUSBAND TO HAVE PT CALL.WE JUST NEED TO TELL PT TO KEEP BOTH APPTS IN NOV 2016 (MAMMO/OFC APPT) PER DR [facility]

## 2014-12-07 ENCOUNTER — Encounter: Payer: Self-pay | Admitting: Internal Medicine

## 2014-12-07 ENCOUNTER — Ambulatory Visit (AMBULATORY_SURGERY_CENTER): Payer: Medicare HMO | Admitting: Internal Medicine

## 2014-12-07 VITALS — BP 140/83 | HR 54 | Temp 97.0°F | Resp 15 | Ht 65.75 in | Wt 152.0 lb

## 2014-12-07 DIAGNOSIS — Z86010 Personal history of colonic polyps: Secondary | ICD-10-CM

## 2014-12-07 MED ORDER — SODIUM CHLORIDE 0.9 % IV SOLN: 500.0000 mL | INTRAVENOUS | Status: DC

## 2014-12-07 NOTE — Progress Notes (Signed)
YOU HAD AN ENDOSCOPIC PROCEDURE TODAY AT [facility]:   Refer to the procedure report that was given to you for any specific questions about what was found during the examination.  If the procedure report does not answer your questions, please call your gastroenterologist to clarify.  If you requested that your care partner not be given the details of your procedure findings, then the procedure report has been included in a sealed envelope for you to review at your convenience later.  YOU SHOULD EXPECT: Some feelings of bloating in the abdomen. Passage of more gas than usual.  Walking can help get rid of the air that was put into your GI tract during the procedure and reduce the bloating. If you had a lower endoscopy (such as a colonoscopy or flexible sigmoidoscopy) you may notice spotting of blood in your stool or on the toilet paper. If you underwent a bowel prep for your procedure, you may not have a normal bowel movement for a few days.  Please Note:  You might notice some irritation and congestion in your nose or some drainage.  This is from the oxygen used during your procedure.  There is no need for concern and it should clear up in a day or so.  SYMPTOMS TO REPORT IMMEDIATELY:   Following lower endoscopy (colonoscopy or flexible sigmoidoscopy):  Excessive amounts of blood in the stool  Significant tenderness or worsening of abdominal pains  Swelling of the abdomen that is new, acute  Fever of 100F or higher  For urgent or emergent issues, a gastroenterologist can be reached at any hour by calling [phone].   DIET: Your first meal following the procedure should be a small meal and then it is ok to progress to your normal diet. Heavy or fried foods are harder to digest and may make you feel nauseous or bloated.  Likewise, meals heavy in dairy and vegetables can increase bloating.  Drink plenty of fluids but you should avoid alcoholic beverages for 24  hours.  ACTIVITY:  You should plan to take it easy for the rest of today and you should NOT DRIVE or use heavy machinery until tomorrow (because of the sedation medicines used during the test).    FOLLOW UP: Our staff will call the number listed on your records the next business day following your procedure to check on you and address any questions or concerns that you may have regarding the information given to you following your procedure. If we do not reach you, we will leave a message.  However, if you are feeling well and you are not experiencing any problems, there is no need to return our call.  We will assume that you have returned to your regular daily activities without incident.  If any biopsies were taken you will be contacted by phone or by letter within the next 1-3 weeks.  Please call us at [phone] if you have not heard about the biopsies in 3 weeks.    SIGNATURES/CONFIDENTIALITY: You and/or your care partner have signed paperwork which will be entered into your electronic medical record.  These signatures attest to the fact that that the information above on your After Visit Summary has been reviewed and is understood.  Full responsibility of the confidentiality of this discharge information lies with you and/or your care-partner.  Diverticulosis, high fiber diet-handouts given  Return to your primary care doctor and follow-up with GI as needed.

## 2014-12-07 NOTE — Patient Instructions (Signed)
YOU HAD AN ENDOSCOPIC PROCEDURE TODAY AT [facility]:   Refer to the procedure report that was given to you for any specific questions about what was found during the examination.  If the procedure report does not answer your questions, please call your gastroenterologist to clarify.  If you requested that your care partner not be given the details of your procedure findings, then the procedure report has been included in a sealed envelope for you to review at your convenience later.  YOU SHOULD EXPECT: Some feelings of bloating in the abdomen. Passage of more gas than usual.  Walking can help get rid of the air that was put into your GI tract during the procedure and reduce the bloating. If you had a lower endoscopy (such as a colonoscopy or flexible sigmoidoscopy) you may notice spotting of blood in your stool or on the toilet paper. If you underwent a bowel prep for your procedure, you may not have a normal bowel movement for a few days.  Please Note:  You might notice some irritation and congestion in your nose or some drainage.  This is from the oxygen used during your procedure.  There is no need for concern and it should clear up in a day or so.  SYMPTOMS TO REPORT IMMEDIATELY:   Following lower endoscopy (colonoscopy or flexible sigmoidoscopy):  Excessive amounts of blood in the stool  Significant tenderness or worsening of abdominal pains  Swelling of the abdomen that is new, acute  Fever of 100F or higher  For urgent or emergent issues, a gastroenterologist can be reached at any hour by calling [phone].   DIET: Your first meal following the procedure should be a small meal and then it is ok to progress to your normal diet. Heavy or fried foods are harder to digest and may make you feel nauseous or bloated.  Likewise, meals heavy in dairy and vegetables can increase bloating.  Drink plenty of fluids but you should avoid alcoholic beverages for 24  hours.  ACTIVITY:  You should plan to take it easy for the rest of today and you should NOT DRIVE or use heavy machinery until tomorrow (because of the sedation medicines used during the test).    FOLLOW UP: Our staff will call the number listed on your records the next business day following your procedure to check on you and address any questions or concerns that you may have regarding the information given to you following your procedure. If we do not reach you, we will leave a message.  However, if you are feeling well and you are not experiencing any problems, there is no need to return our call.  We will assume that you have returned to your regular daily activities without incident.  If any biopsies were taken you will be contacted by phone or by letter within the next 1-3 weeks.  Please call us at [phone] if you have not heard about the biopsies in 3 weeks.    SIGNATURES/CONFIDENTIALITY: You and/or your care partner have signed paperwork which will be entered into your electronic medical record.  These signatures attest to the fact that that the information above on your After Visit Summary has been reviewed and is understood.  Full responsibility of the confidentiality of this discharge information lies with you and/or your care-partner.  Diverticulosis, high fiber diet-handouts given  Return to your primary care doctor and follow-up with GI as needed.

## 2014-12-07 NOTE — Progress Notes (Signed)
Report to PACU, RN, vss, BBS= Clear.  

## 2014-12-07 NOTE — Op Note (Signed)
[facility]  [address]. [place], [id number]   COLONOSCOPY PROCEDURE REPORT  PATIENT: Paige Medina, Paige Medina  MR#: [id number] BIRTHDATE: 04-21-1933 , 35  yrs. old GENDER: female ENDOSCOPIST: Eustace Quail, MD REFERRED YT:[id number] Program Recall PROCEDURE DATE:  12/07/2014 PROCEDURE:   Colonoscopy, surveillance First Screening Colonoscopy - Avg.  risk and is 50 yrs.  old or older - No.  Prior Negative Screening - Now for repeat screening. N/A  History of Adenoma - Now for follow-up colonoscopy & has been > or = to 3 yrs.  Yes hx of adenoma.  Has been 3 or more years since last colonoscopy.  Polyps removed today? No Recommend repeat exam, <10 yrs? No ASA CLASS:   Class II INDICATIONS:Surveillance due to prior colonic neoplasia and PH Colon Adenoma. . Prior examinations 2003 (negative), 2006 (hyperplastic), and 2011 (small tubular adenoma) MEDICATIONS: Monitored anesthesia care and Propofol 200 mg IV  DESCRIPTION OF PROCEDURE:   After the risks benefits and alternatives of the procedure were thoroughly explained, informed consent was obtained.  The digital rectal exam revealed no abnormalities of the rectum.   The [id number]  endoscope was introduced through the anus and advanced to the cecum, which was identified by both the appendix and ileocecal valve. No adverse events experienced.   The quality of the prep was excellent. (MiraLax was used)  The instrument was then slowly withdrawn as the colon was fully examined. Estimated blood loss is zero unless otherwise noted in this procedure report.    COLON FINDINGS: There was moderate diverticulosis noted in the sigmoid colon.   The examination was otherwise normal.  Retroflexed views revealed no abnormalities. The time to cecum = 2.7 Withdrawal time = 10.7   The scope was withdrawn and the procedure completed. COMPLICATIONS: There were no immediate complications.  ENDOSCOPIC IMPRESSION: 1.    Moderate diverticulosis was noted in the sigmoid colon 2.   The examination was otherwise normal  RECOMMENDATIONS: 1. Return to the care of your primary provider.  GI follow up as needed  eSigned:  Eustace Quail, MD 12/07/2014 1:44 PM   cc: The Patient and [name] MD

## 2014-12-10 ENCOUNTER — Telehealth: Payer: Self-pay | Admitting: *Deleted

## 2014-12-10 NOTE — Telephone Encounter (Signed)
  No answer and answering machine did not pick up to leave a message.

## 2014-12-11 ENCOUNTER — Encounter: Payer: Self-pay | Admitting: Family Medicine

## 2014-12-21 ENCOUNTER — Ambulatory Visit (INDEPENDENT_AMBULATORY_CARE_PROVIDER_SITE_OTHER): Payer: Medicare HMO

## 2014-12-21 DIAGNOSIS — Z23 Encounter for immunization: Secondary | ICD-10-CM | POA: Diagnosis not present

## 2014-12-24 ENCOUNTER — Ambulatory Visit (INDEPENDENT_AMBULATORY_CARE_PROVIDER_SITE_OTHER): Payer: Medicare HMO | Admitting: Family Medicine

## 2014-12-24 ENCOUNTER — Encounter: Payer: Self-pay | Admitting: Family Medicine

## 2014-12-24 VITALS — BP 126/62 | HR 56 | Temp 98.1°F | Wt 154.5 lb

## 2014-12-24 DIAGNOSIS — L309 Dermatitis, unspecified: Secondary | ICD-10-CM | POA: Insufficient documentation

## 2014-12-24 DIAGNOSIS — N7689 Other specified inflammation of vagina and vulva: Secondary | ICD-10-CM

## 2014-12-24 MED ORDER — LOVASTATIN 40 MG PO TABS: 40.0000 mg | ORAL_TABLET | Freq: Every day | ORAL | Status: DC

## 2014-12-24 MED ORDER — FLUCONAZOLE 150 MG PO TABS: 150.0000 mg | ORAL_TABLET | ORAL | Status: DC

## 2014-12-24 NOTE — Progress Notes (Signed)
BP 126/62 mmHg  Pulse 56  Temp(Src) 98.1 F (36.7 C) (Oral)  Wt 154 lb 8 oz (70.081 kg)   CC: vaginal pain  Subjective:    Patient ID: Paige Medina, female    DOB: Jul 31, 1933, 81 y.o.   MRN: [id number]  HPI: Paige Medina is a 81 y.o. female presenting on 12/24/2014 for Vaginal Pain   Ongoing sxs for last few months - treated for yeast infection (ketoconazole cream) with initial improvement but sxs recurred over the last few days. Intermittent symptoms. Describes burning vulvar pain with red erythematous burning rash, not really itchy. Tried vaseline for this. No vag discharge or UTI sxs or bleeding. No fevers/chills. Intermittent mild lower abd cramping.   No new creams or lotions. No new detergents.  She actually stopped using estrace when rash developed.   Relevant past medical, surgical, family and social history reviewed and updated as indicated. Interim medical history since our last visit reviewed. Allergies and medications reviewed and updated. Current Outpatient Prescriptions on File Prior to Visit  Medication Sig  . acetaminophen (TYLENOL) 500 MG tablet Take 500 mg by mouth as needed.  . Ascorbic Acid (VITAMIN C PO) Take by mouth as needed.  . beta carotene w/minerals (OCUVITE) tablet Take 1 tablet by mouth daily.  . Calcium Carbonate-Vitamin D (CALCIUM 600 + D PO) Take 1 tablet by mouth daily.   . Coenzyme Q10 (COQ-10) 100 MG CAPS Take 1 capsule by mouth daily.  Mariane Baumgarten Calcium (STOOL SOFTENER PO) Take 1 capsule by mouth daily.   [name] estradiol (ESTRACE) 0.1 MG/GM vaginal cream Place 1 Applicatorful vaginally 2 (two) times a week.  [name] glucose blood test strip Use as instructed  . ketoconazole (NIZORAL) 2 % cream Apply 1 application topically daily.  Canesha Tesfaye Docker Oil 1000 MG CAPS Take 1 capsule by mouth daily.  [name] losartan-hydrochlorothiazide (HYZAAR) 50-12.5 MG per tablet Take 1 tablet by mouth daily.  . metoprolol succinate (TOPROL-XL) 25 MG 24 hr tablet Take 0.5  tablets (12.5 mg total) by mouth daily. Take with or immediately following a meal.  . Probiotic Product (PROBIOTIC DAILY PO) Take 1 capsule by mouth daily.   . Zinc Sulfate (ZINC 15 PO) Take 15 mg by mouth as needed (with cold symptoms).    No current facility-administered medications on file prior to visit.    Review of Systems Per HPI unless specifically indicated in ROS section     Objective:    BP 126/62 mmHg  Pulse 56  Temp(Src) 98.1 F (36.7 C) (Oral)  Wt 154 lb 8 oz (70.081 kg)  Wt Readings from Last 3 Encounters:  12/24/14 154 lb 8 oz (70.081 kg)  12/07/14 152 lb (68.947 kg)  11/23/14 158 lb (71.668 kg)    Physical Exam  Constitutional: She appears well-developed and well-nourished. No distress.  HENT:  Mouth/Throat: Oropharynx is clear and moist. No oropharyngeal exudate.  Abdominal: Soft. Normal appearance and bowel sounds are normal. She exhibits no distension and no mass. There is no hepatosplenomegaly. There is no tenderness. There is no rigidity, no rebound, no guarding, no CVA tenderness and negative Murphy's sign.  Genitourinary: Pelvic exam was performed with patient supine. There is rash and tenderness on the right labia. There is no lesion or injury on the right labia. There is rash and tenderness on the left labia. There is no lesion or injury on the left labia. No vaginal discharge found.  L>R labial erythema and tenderness, small erosions inferior vulvar edge  Nursing note and vitals reviewed.     Assessment & Plan:   Problem List Items Addressed This Visit    Vulvar dermatitis - Primary    Candidal vs other, no trigger found. ?vulvar vestibulitis. Wet prep collected and sent although no significant discharge present. rec treat with sitz baths, vaseline, and oral antifungal (diflucan 150mg  weekly x2 weeks). If no improvement noted with treatment, will refer to GYN for further evaluation. Pt agrees with plan. Pt stopped estrace cream when rash began.           Follow up plan: Return if symptoms worsen or fail to improve.

## 2014-12-24 NOTE — Patient Instructions (Addendum)
Still possible yeast infection or possible vulvar dermatitis. 5-10 min sitz bath with vaseline application afterwards. Use cotton clothing especially underwear. Oral antifungal sent to pharmacy, may continue ketoconazole. If no improvement with above, let us know and we will refer you to GYN for further evaluation.

## 2014-12-24 NOTE — Addendum Note (Signed)
Addended by: Ellamae Sia on: 12/24/2014 02:20 PM   Modules accepted: Orders

## 2014-12-24 NOTE — Addendum Note (Signed)
Addended by: [name] on: 12/24/2014 02:13 PM   Modules accepted: Orders

## 2014-12-24 NOTE — Progress Notes (Signed)
Pre visit review using our clinic review tool, if applicable. No additional management support is needed unless otherwise documented below in the visit note. 

## 2014-12-24 NOTE — Assessment & Plan Note (Addendum)
Candidal vs other, no trigger found. ?vulvar vestibulitis. Wet prep collected and sent although no significant discharge present. rec treat with sitz baths, vaseline, and oral antifungal (diflucan 150mg  weekly x2 weeks). If no improvement noted with treatment, will refer to GYN for further evaluation. Pt agrees with plan. Pt stopped estrace cream when rash began.

## 2014-12-25 LAB — WET PREP BY MOLECULAR PROBE
Candida species: NEGATIVE
Gardnerella vaginalis: NEGATIVE
Trichomonas vaginosis: NEGATIVE

## 2014-12-28 ENCOUNTER — Encounter: Payer: Self-pay | Admitting: Dietician

## 2015-01-15 ENCOUNTER — Ambulatory Visit: Payer: Medicare HMO | Admitting: General Surgery

## 2015-01-18 ENCOUNTER — Encounter: Payer: Self-pay | Admitting: General Surgery

## 2015-01-22 ENCOUNTER — Ambulatory Visit (INDEPENDENT_AMBULATORY_CARE_PROVIDER_SITE_OTHER): Payer: Medicare HMO | Admitting: General Surgery

## 2015-01-22 ENCOUNTER — Encounter: Payer: Self-pay | Admitting: General Surgery

## 2015-01-22 VITALS — BP 108/58 | HR 64 | Resp 12 | Ht 65.7 in | Wt 155.0 lb

## 2015-01-22 DIAGNOSIS — C50912 Malignant neoplasm of unspecified site of left female breast: Secondary | ICD-10-CM

## 2015-01-22 NOTE — Patient Instructions (Signed)
Continue self breast exams. Call office for any new breast issues or concerns. 
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
## 2015-03-27 ENCOUNTER — Other Ambulatory Visit: Payer: Self-pay | Admitting: Family Medicine

## 2015-03-27 DIAGNOSIS — E119 Type 2 diabetes mellitus without complications: Secondary | ICD-10-CM

## 2015-03-27 DIAGNOSIS — L280 Lichen simplex chronicus: Secondary | ICD-10-CM | POA: Diagnosis not present

## 2015-03-27 DIAGNOSIS — L578 Other skin changes due to chronic exposure to nonionizing radiation: Secondary | ICD-10-CM | POA: Diagnosis not present

## 2015-03-27 DIAGNOSIS — E1122 Type 2 diabetes mellitus with diabetic chronic kidney disease: Secondary | ICD-10-CM

## 2015-03-27 DIAGNOSIS — N183 Chronic kidney disease, stage 3 (moderate): Secondary | ICD-10-CM

## 2015-03-27 DIAGNOSIS — I8393 Asymptomatic varicose veins of bilateral lower extremities: Secondary | ICD-10-CM | POA: Diagnosis not present

## 2015-03-27 DIAGNOSIS — L820 Inflamed seborrheic keratosis: Secondary | ICD-10-CM | POA: Diagnosis not present

## 2015-03-27 DIAGNOSIS — D229 Melanocytic nevi, unspecified: Secondary | ICD-10-CM | POA: Diagnosis not present

## 2015-03-27 DIAGNOSIS — E785 Hyperlipidemia, unspecified: Secondary | ICD-10-CM

## 2015-03-27 DIAGNOSIS — L719 Rosacea, unspecified: Secondary | ICD-10-CM | POA: Diagnosis not present

## 2015-03-27 DIAGNOSIS — R21 Rash and other nonspecific skin eruption: Secondary | ICD-10-CM | POA: Diagnosis not present

## 2015-03-27 DIAGNOSIS — Z85828 Personal history of other malignant neoplasm of skin: Secondary | ICD-10-CM | POA: Diagnosis not present

## 2015-03-27 DIAGNOSIS — D1800 Hemangioma unspecified site: Secondary | ICD-10-CM | POA: Diagnosis not present

## 2015-03-27 DIAGNOSIS — Z1283 Encounter for screening for malignant neoplasm of skin: Secondary | ICD-10-CM | POA: Diagnosis not present

## 2015-03-27 DIAGNOSIS — L821 Other seborrheic keratosis: Secondary | ICD-10-CM | POA: Diagnosis not present

## 2015-03-27 DIAGNOSIS — L812 Freckles: Secondary | ICD-10-CM | POA: Diagnosis not present

## 2015-03-29 ENCOUNTER — Other Ambulatory Visit (INDEPENDENT_AMBULATORY_CARE_PROVIDER_SITE_OTHER): Payer: PPO

## 2015-03-29 DIAGNOSIS — E785 Hyperlipidemia, unspecified: Secondary | ICD-10-CM

## 2015-03-29 DIAGNOSIS — E119 Type 2 diabetes mellitus without complications: Secondary | ICD-10-CM

## 2015-03-29 DIAGNOSIS — E1122 Type 2 diabetes mellitus with diabetic chronic kidney disease: Secondary | ICD-10-CM | POA: Diagnosis not present

## 2015-03-29 DIAGNOSIS — N183 Chronic kidney disease, stage 3 (moderate): Secondary | ICD-10-CM | POA: Diagnosis not present

## 2015-03-29 LAB — RENAL FUNCTION PANEL
ALBUMIN: 4 g/dL (ref 3.5–5.2)
BUN: 29 mg/dL — AB (ref 6–23)
CO2: 30 meq/L (ref 19–32)
CREATININE: 1.28 mg/dL — AB (ref 0.40–1.20)
Calcium: 9.5 mg/dL (ref 8.4–10.5)
Chloride: 102 mEq/L (ref 96–112)
GFR: 42.66 mL/min — ABNORMAL LOW (ref >60.00)
Glucose, Bld: 147 mg/dL — ABNORMAL HIGH (ref 70–99)
PHOSPHORUS: 3.5 mg/dL (ref 2.3–4.6)
Potassium: 4.4 mEq/L (ref 3.5–5.1)
Sodium: 139 mEq/L (ref 135–145)

## 2015-03-29 LAB — LIPID PANEL
CHOL/HDL RATIO: 4
Cholesterol: 167 mg/dL (ref 0–200)
HDL: 45.5 mg/dL (ref >39.00)
LDL Cholesterol: 94 mg/dL (ref 0–99)
NONHDL: 121.28
TRIGLYCERIDES: 138 mg/dL (ref 0.0–149.0)
VLDL: 27.6 mg/dL (ref 0.0–40.0)

## 2015-03-29 LAB — HEMOGLOBIN A1C: HEMOGLOBIN A1C: 6.9 % — AB (ref 4.6–6.5)

## 2015-04-05 ENCOUNTER — Encounter: Payer: Self-pay | Admitting: Family Medicine

## 2015-04-05 ENCOUNTER — Ambulatory Visit (INDEPENDENT_AMBULATORY_CARE_PROVIDER_SITE_OTHER): Payer: PPO | Admitting: Family Medicine

## 2015-04-05 VITALS — BP 128/62 | HR 64 | Temp 97.6°F | Wt 158.0 lb

## 2015-04-05 DIAGNOSIS — E1122 Type 2 diabetes mellitus with diabetic chronic kidney disease: Secondary | ICD-10-CM | POA: Diagnosis not present

## 2015-04-05 DIAGNOSIS — E119 Type 2 diabetes mellitus without complications: Secondary | ICD-10-CM | POA: Diagnosis not present

## 2015-04-05 DIAGNOSIS — I10 Essential (primary) hypertension: Secondary | ICD-10-CM | POA: Diagnosis not present

## 2015-04-05 DIAGNOSIS — N183 Chronic kidney disease, stage 3 (moderate): Secondary | ICD-10-CM

## 2015-04-05 DIAGNOSIS — E785 Hyperlipidemia, unspecified: Secondary | ICD-10-CM

## 2015-04-05 NOTE — Assessment & Plan Note (Signed)
Chronic, deterioration noted. Pt motivated to make diet improvements. Has completed DSME

## 2015-04-05 NOTE — Assessment & Plan Note (Signed)
Reviewed dx with patient, pt aware to avoid nsaids and stay well hydrated.

## 2015-04-05 NOTE — Patient Instructions (Addendum)
Return as needed or in 6 months for medicare wellness visit/physical.  You are doing well today - continue current medicines. Work on [employer]

## 2015-04-05 NOTE — Assessment & Plan Note (Signed)
Chronic, stable. Continue current regimen of krill oil and lovastatin and CoQ10

## 2015-04-05 NOTE — Progress Notes (Signed)
BP 128/62 mmHg  Pulse 64  Temp(Src) 97.6 F (36.4 C) (Oral)  Wt 158 lb (71.668 kg)   CC: 6 mo f/u visit  Subjective:    Patient ID: Paige Medina, female    DOB: Mar 28, 1933, 82 y.o.   MRN: [id number]  HPI: Paige Medina is a 82 y.o. female presenting on 04/05/2015 for Follow-up   Started [employer] and [employer] organic vinegar 3.5 wks ago.   Vulvar dermatitis - better. Using aquaphor regularly.   HTN - compliant with current antihypertensive regimen of hyzaar 50/12.5mg  daily, toprol XL 12.5mg  daily. Does check blood pressures at home: well controlled. No low blood pressure readings or symptoms of dizziness/syncope. Denies HA, vision changes, CP/tightness, SOB, leg swelling.    HLD - last visit we trialed lovastatin due to myalgias with stronger statins (lipitor, simvastatin) despite coQ10.   DM - regularly does not check sugars. Compliant with antihyperglycemic regimen which includes: diet controlled - tough holiday season with some dietary liberties and lots of eating out. Denies low sugars or hypoglycemic symptoms. Denies paresthesias. Last diabetic eye exam 04/2014.  Pneumovax: 2007.  Prevnar: 2015. Lab Results  Component Value Date   HGBA1C 6.9* 03/29/2015   Diabetic Foot Exam - Simple   Simple Foot Form  Diabetic Foot exam was performed with the following findings:  Yes 04/05/2015  8:27 AM  Visual Inspection  No deformities, no ulcerations, no other skin breakdown bilaterally:  Yes  Sensation Testing  Intact to touch and monofilament testing bilaterally:  Yes  Pulse Check  Posterior Tibialis and Dorsalis pulse intact bilaterally:  Yes  Comments        Relevant past medical, surgical, family and social history reviewed and updated as indicated. Interim medical history since our last visit reviewed. Allergies and medications reviewed and updated. Current Outpatient Prescriptions on File Prior to Visit  Medication Sig  . acetaminophen (TYLENOL) 500 MG tablet Take 500 mg  by mouth as needed.  . Ascorbic Acid (VITAMIN C PO) Take by mouth as needed.  . beta carotene w/minerals (OCUVITE) tablet Take 1 tablet by mouth daily.  . Calcium Carbonate-Vitamin D (CALCIUM 600 + D PO) Take 1 tablet by mouth daily.   . Coenzyme Q10 (COQ-10) 100 MG CAPS Take 1 capsule by mouth daily.  Paige Medina Calcium (STOOL SOFTENER PO) Take 1 capsule by mouth daily.   [name] glucose blood test strip Use as instructed  . ketoconazole (NIZORAL) 2 % cream Apply 1 application topically daily.  Paige Medina Docker Oil 1000 MG CAPS Take 1 capsule by mouth daily.  [name] losartan-hydrochlorothiazide (HYZAAR) 50-12.5 MG per tablet Take 1 tablet by mouth daily.  [name] lovastatin (MEVACOR) 40 MG tablet Take 1 tablet (40 mg total) by mouth at bedtime.  . metoprolol succinate (TOPROL-XL) 25 MG 24 hr tablet Take 0.5 tablets (12.5 mg total) by mouth daily. Take with or immediately following a meal.  . Probiotic Product (PROBIOTIC DAILY PO) Take 1 capsule by mouth daily.   . Zinc Sulfate (ZINC 15 PO) Take 15 mg by mouth as needed (with cold symptoms).   [name] estradiol (ESTRACE) 0.1 MG/GM vaginal cream Place 1 Applicatorful vaginally 2 (two) times a week. (Patient not taking: Reported on 04/05/2015)   No current facility-administered medications on file prior to visit.    Review of Systems Per HPI unless specifically indicated in ROS section     Objective:    BP 128/62 mmHg  Pulse 64  Temp(Src) 97.6 F (36.4 C) (Oral)  Wt 158 lb (71.668 kg)  Wt Readings from Last 3 Encounters:  04/05/15 158 lb (71.668 kg)  01/22/15 155 lb (70.308 kg)  12/24/14 154 lb 8 oz (70.081 kg)   Body mass index is 25.73 kg/(m^2).  Physical Exam  Constitutional: She appears well-developed and well-nourished. No distress.  HENT:  Head: Normocephalic and atraumatic.  Right Ear: External ear normal.  Left Ear: External ear normal.  Nose: Nose normal.  Mouth/Throat: Oropharynx is clear and moist. No oropharyngeal exudate.  Eyes: Conjunctivae and  EOM are normal. Pupils are equal, round, and reactive to light. No scleral icterus.  Neck: Normal range of motion. Neck supple.  Cardiovascular: Normal rate, regular rhythm and intact distal pulses.   Murmur (chronic 2/6 SEM) heard. Pulmonary/Chest: Effort normal and breath sounds normal. No respiratory distress. She has no wheezes. She has no rales.  Musculoskeletal: She exhibits no edema.  See HPI for foot exam if done  Lymphadenopathy:    She has no cervical adenopathy.  Skin: Skin is warm and dry. No rash noted.  Psychiatric: She has a normal mood and affect.  Nursing note and vitals reviewed.  Results for orders placed or performed in visit on 03/29/15  Renal function panel  Result Value Ref Range   Sodium 139 135 - 145 mEq/L   Potassium 4.4 3.5 - 5.1 mEq/L   Chloride 102 96 - 112 mEq/L   CO2 30 19 - 32 mEq/L   Calcium 9.5 8.4 - 10.5 mg/dL   Albumin 4.0 3.5 - 5.2 g/dL   BUN 29 (H) 6 - 23 mg/dL   Creatinine, Ser 1.28 (H) 0.40 - 1.20 mg/dL   Glucose, Bld 147 (H) 70 - 99 mg/dL   Phosphorus 3.5 2.3 - 4.6 mg/dL   GFR 42.66 (L) >60.00 mL/min  Lipid panel  Result Value Ref Range   Cholesterol 167 0 - 200 mg/dL   Triglycerides 138.0 0.0 - 149.0 mg/dL   HDL 45.50 >39.00 mg/dL   VLDL 27.6 0.0 - 40.0 mg/dL   LDL Cholesterol 94 0 - 99 mg/dL   Total CHOL/HDL Ratio 4    NonHDL 121.28   Hemoglobin A1c  Result Value Ref Range   Hgb A1c MFr Bld 6.9 (H) 4.6 - 6.5 %      Assessment & Plan:   Problem List Items Addressed This Visit    Hypertension    Chronic, stable. Continue current regimen.      Hyperlipidemia    Chronic, stable. Continue current regimen of krill oil and lovastatin and CoQ10      Diabetes mellitus without complication ([facility])    Chronic, deterioration noted. Pt motivated to make diet improvements. Has completed DSME       CKD stage 3 due to type 2 diabetes mellitus ([place]) - Primary    Reviewed dx with patient, pt aware to avoid nsaids and stay well hydrated.             Follow up plan: Return in about 6 months (around 10/03/2015), or as needed, for medicare wellness visit.

## 2015-04-05 NOTE — Progress Notes (Signed)
Pre visit review using our clinic review tool, if applicable. No additional management support is needed unless otherwise documented below in the visit note. 

## 2015-04-05 NOTE — Assessment & Plan Note (Signed)
Chronic, stable. Continue current regimen. 

## 2015-04-09 DIAGNOSIS — E119 Type 2 diabetes mellitus without complications: Secondary | ICD-10-CM | POA: Diagnosis not present

## 2015-04-09 LAB — HM DIABETES EYE EXAM

## 2015-04-10 LAB — HM DIABETES EYE EXAM

## 2015-04-11 ENCOUNTER — Encounter: Payer: Self-pay | Admitting: Family Medicine

## 2015-05-02 ENCOUNTER — Other Ambulatory Visit: Payer: Self-pay | Admitting: Family Medicine

## 2015-06-05 DIAGNOSIS — L820 Inflamed seborrheic keratosis: Secondary | ICD-10-CM | POA: Diagnosis not present

## 2015-06-05 DIAGNOSIS — R21 Rash and other nonspecific skin eruption: Secondary | ICD-10-CM | POA: Diagnosis not present

## 2015-06-05 DIAGNOSIS — L578 Other skin changes due to chronic exposure to nonionizing radiation: Secondary | ICD-10-CM | POA: Diagnosis not present

## 2015-06-11 ENCOUNTER — Other Ambulatory Visit: Payer: Self-pay | Admitting: Family Medicine

## 2015-07-03 ENCOUNTER — Telehealth: Payer: Self-pay | Admitting: Family Medicine

## 2015-07-03 NOTE — Telephone Encounter (Signed)
LM for pt to sch AWV/lab appt and CPE appt after 8/2 (date of last CPE), mn

## 2015-07-24 ENCOUNTER — Telehealth: Payer: Self-pay | Admitting: *Deleted

## 2015-07-24 DIAGNOSIS — L309 Dermatitis, unspecified: Secondary | ICD-10-CM

## 2015-07-24 NOTE — Telephone Encounter (Signed)
Patient called stating that she is still having issues with the vulvar issues. She would like to proceed with GYN referral that you mentioned to her awhile back.

## 2015-07-25 ENCOUNTER — Other Ambulatory Visit: Payer: Self-pay

## 2015-07-25 DIAGNOSIS — C50912 Malignant neoplasm of unspecified site of left female breast: Secondary | ICD-10-CM

## 2015-07-25 NOTE — Telephone Encounter (Signed)
Referral placed. plz send last 2 office notes.

## 2015-08-14 ENCOUNTER — Inpatient Hospital Stay
Admission: RE | Admit: 2015-08-14 | Discharge: 2015-08-14 | Disposition: A | Discharge status: Home / Self Care | Payer: Self-pay | Source: Ambulatory Visit | Attending: *Deleted | Admitting: *Deleted

## 2015-08-14 ENCOUNTER — Other Ambulatory Visit: Payer: Self-pay | Admitting: *Deleted

## 2015-08-14 DIAGNOSIS — Z9289 Personal history of other medical treatment: Secondary | ICD-10-CM

## 2015-08-16 ENCOUNTER — Ambulatory Visit (INDEPENDENT_AMBULATORY_CARE_PROVIDER_SITE_OTHER): Payer: PPO | Admitting: Family Medicine

## 2015-08-16 ENCOUNTER — Encounter: Payer: Self-pay | Admitting: Family Medicine

## 2015-08-16 VITALS — BP 163/73 | HR 56 | Resp 18 | Ht 65.75 in | Wt 156.0 lb

## 2015-08-16 DIAGNOSIS — N7689 Other specified inflammation of vagina and vulva: Secondary | ICD-10-CM

## 2015-08-16 DIAGNOSIS — L309 Dermatitis, unspecified: Secondary | ICD-10-CM

## 2015-08-16 MED ORDER — HYDROCORTISONE 1 % EX OINT: 1.0000 "application " | TOPICAL_OINTMENT | Freq: Two times a day (BID) | CUTANEOUS | Status: DC

## 2015-08-16 NOTE — Assessment & Plan Note (Addendum)
Unclear etiology - is this just hyperkeratosis from chronic mild trauma - trial of steroid cream for flares, continue Aquafor if not flaring. Try to return during flare for further evaluation and delineation of the problem.

## 2015-08-16 NOTE — Progress Notes (Signed)
   Subjective:    Patient ID: Paige Medina is a 82 y.o. female presenting with Vulvar Irritation  on 08/16/2015  HPI: Intense burning and inflammed area that comes and goes on the left labia majora. Sometimes it is quite painful. Worse with sitting or riding the stationary bike at the [facility]. This has been going on for some time. Has seen PCP and tried Aquafor which helped some, but problem is persistent. She is s/p TAH for fibroids. Has other medical problems. There is no growth. She has noted some erythema in the area.  Review of Systems  Constitutional: Negative for fever and chills.  Respiratory: Negative for shortness of breath.   Cardiovascular: Negative for chest pain.  Gastrointestinal: Negative for nausea, vomiting and abdominal pain.  Genitourinary: Negative for dysuria.  Skin: Negative for rash.      Objective:    BP 163/73 mmHg  Pulse 56  Resp 18  Ht 5' 5.75" (1.67 m)  Wt 156 lb (70.761 kg)  BMI 25.37 kg/m2 Physical Exam  Constitutional: She is oriented to person, place, and time. She appears well-developed and well-nourished. No distress.  HENT:  Head: Normocephalic and atraumatic.  Eyes: No scleral icterus.  Neck: Neck supple.  Cardiovascular: Normal rate.   Pulmonary/Chest: Effort normal.  Abdominal: Soft.  Genitourinary:  There is some mild erythema of both labia majora. Area of interest is hypopigmented and is 0.3 x 0.7 cm. There is no induration, growth, ingrown hair associated with this.  Neurological: She is alert and oriented to person, place, and time.  Skin: Skin is warm and dry.  Psychiatric: She has a normal mood and affect.        Assessment & Plan:   Problem List Items Addressed This Visit      Unprioritized   Vulvar dermatitis - Primary    Unclear etiology - is this just hyperkeratosis from chronic mild trauma - trial of steroid cream for flares, continue Aquafor if not flaring. Try to return during flare for further evaluation and  delineation of the problem.      Relevant Medications   hydrocortisone 1 % ointment       Return in about 3 months (around 11/16/2015).  Jeanpaul Biehl S 08/16/2015 11:37 AM

## 2015-09-26 ENCOUNTER — Other Ambulatory Visit: Payer: Self-pay | Admitting: Family Medicine

## 2015-09-26 DIAGNOSIS — E1122 Type 2 diabetes mellitus with diabetic chronic kidney disease: Secondary | ICD-10-CM

## 2015-09-26 DIAGNOSIS — E785 Hyperlipidemia, unspecified: Secondary | ICD-10-CM

## 2015-09-26 DIAGNOSIS — E119 Type 2 diabetes mellitus without complications: Secondary | ICD-10-CM

## 2015-09-26 DIAGNOSIS — N183 Chronic kidney disease, stage 3 (moderate): Secondary | ICD-10-CM

## 2015-09-27 ENCOUNTER — Other Ambulatory Visit (INDEPENDENT_AMBULATORY_CARE_PROVIDER_SITE_OTHER): Payer: PPO

## 2015-09-27 ENCOUNTER — Ambulatory Visit (INDEPENDENT_AMBULATORY_CARE_PROVIDER_SITE_OTHER): Payer: PPO

## 2015-09-27 VITALS — BP 132/78 | HR 55 | Temp 97.5°F | Ht 65.75 in | Wt 153.5 lb

## 2015-09-27 DIAGNOSIS — E1122 Type 2 diabetes mellitus with diabetic chronic kidney disease: Secondary | ICD-10-CM | POA: Diagnosis not present

## 2015-09-27 DIAGNOSIS — E785 Hyperlipidemia, unspecified: Secondary | ICD-10-CM | POA: Diagnosis not present

## 2015-09-27 DIAGNOSIS — E119 Type 2 diabetes mellitus without complications: Secondary | ICD-10-CM

## 2015-09-27 DIAGNOSIS — Z0000 Encounter for general adult medical examination without abnormal findings: Secondary | ICD-10-CM | POA: Diagnosis not present

## 2015-09-27 DIAGNOSIS — N183 Chronic kidney disease, stage 3 (moderate): Secondary | ICD-10-CM | POA: Diagnosis not present

## 2015-09-27 LAB — CBC WITH DIFFERENTIAL/PLATELET
BASOS PCT: 0.7 % (ref 0.0–3.0)
Basophils Absolute: 0.1 10*3/uL (ref 0.0–0.1)
EOS PCT: 2.7 % (ref 0.0–5.0)
Eosinophils Absolute: 0.2 10*3/uL (ref 0.0–0.7)
HCT: 39.9 % (ref 36.0–46.0)
Hemoglobin: 13.4 g/dL (ref 12.0–15.0)
LYMPHS ABS: 2.3 10*3/uL (ref 0.7–4.0)
Lymphocytes Relative: 27.1 % (ref 12.0–46.0)
MCHC: 33.7 g/dL (ref 30.0–36.0)
MCV: 92.1 fl (ref 78.0–100.0)
MONOS PCT: 7.4 % (ref 3.0–12.0)
Monocytes Absolute: 0.6 10*3/uL (ref 0.1–1.0)
NEUTROS PCT: 62.1 % (ref 43.0–77.0)
Neutro Abs: 5.2 10*3/uL (ref 1.4–7.7)
Platelets: 239 10*3/uL (ref 150.0–400.0)
RBC: 4.34 Mil/uL (ref 3.87–5.11)
RDW: 13.4 % (ref 11.5–15.5)
WBC: 8.4 10*3/uL (ref 4.0–10.5)

## 2015-09-27 LAB — RENAL FUNCTION PANEL
Albumin: 4.1 g/dL (ref 3.5–5.2)
BUN: 24 mg/dL — ABNORMAL HIGH (ref 6–23)
CALCIUM: 9.5 mg/dL (ref 8.4–10.5)
CHLORIDE: 104 meq/L (ref 96–112)
CO2: 29 meq/L (ref 19–32)
Creatinine, Ser: 1.24 mg/dL — ABNORMAL HIGH (ref 0.40–1.20)
GFR: 44.19 mL/min — AB (ref >60.00)
GLUCOSE: 136 mg/dL — AB (ref 70–99)
POTASSIUM: 4.2 meq/L (ref 3.5–5.1)
Phosphorus: 3.2 mg/dL (ref 2.3–4.6)
Sodium: 139 mEq/L (ref 135–145)

## 2015-09-27 LAB — LIPID PANEL
CHOLESTEROL: 165 mg/dL (ref 0–200)
HDL: 47.4 mg/dL (ref >39.00)
LDL CALC: 90 mg/dL (ref 0–99)
NonHDL: 117.94
TRIGLYCERIDES: 140 mg/dL (ref 0.0–149.0)
Total CHOL/HDL Ratio: 3
VLDL: 28 mg/dL (ref 0.0–40.0)

## 2015-09-27 LAB — VITAMIN D 25 HYDROXY (VIT D DEFICIENCY, FRACTURES): VITD: 31.08 ng/mL (ref 30.00–100.00)

## 2015-09-27 LAB — HEMOGLOBIN A1C: Hgb A1c MFr Bld: 6.9 % — ABNORMAL HIGH (ref 4.6–6.5)

## 2015-09-27 NOTE — Progress Notes (Signed)
Pre visit review using our clinic review tool, if applicable. No additional management support is needed unless otherwise documented below in the visit note. 

## 2015-09-27 NOTE — Progress Notes (Signed)
PCP notes:   Health maintenance:  Tetanus - postponed/insurance   Abnormal screenings: None   Patient concerns: None   Nurse concerns: None   Next PCP appt:  10/04/15 @ 1130
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
## 2015-09-27 NOTE — Patient Instructions (Signed)
Paige Medina , Thank you for taking time to come for your Medicare Wellness Visit. I appreciate your ongoing commitment to your health goals. Please review the following plan we discussed and let me know if I can assist you in the future.   These are the goals we discussed:  Starting 09/27/2015,  I will continue to exercise for at least 50 min 3 days per week.   This is a list of the screening recommended for you and due dates:  Health Maintenance  Topic Date Due  . DTaP/Tdap/Td vaccine (1 - Tdap) 09/26/2016*  . Tetanus Vaccine  09/26/2016*  . Flu Shot  10/01/2015  . Hemoglobin A1C  03/29/2016  . Complete foot exam   04/04/2016  . Eye exam for diabetics  04/09/2016  . DEXA scan (bone density measurement)  Completed  . Shingles Vaccine  Completed  . Pneumonia vaccines  Completed  *Topic was postponed. The date shown is not the original due date.   Preventive Care for Adults  A healthy lifestyle and preventive care can promote health and wellness. Preventive health guidelines for adults include the following key practices.  . A routine yearly physical is a good way to check with your health care provider about your health and preventive screening. It is a chance to share any concerns and updates on your health and to receive a thorough exam.  . Visit your dentist for a routine exam and preventive care every 6 months. Brush your teeth twice a day and floss once a day. Good oral hygiene prevents tooth decay and gum disease.  . The frequency of eye exams is based on your age, health, family medical history, use  of contact lenses, and other factors. Follow your health care provider's ecommendations for frequency of eye exams.  . Eat a healthy diet. Foods like vegetables, fruits, whole grains, low-fat dairy products, and lean protein foods contain the nutrients you need without too many calories. Decrease your intake of foods high in solid fats, added sugars, and salt. Eat the right amount  of calories for you. Get information about a proper diet from your health care provider, if necessary.  . Regular physical exercise is one of the most important things you can do for your health. Most adults should get at least 150 minutes of moderate-intensity exercise (any activity that increases your heart rate and causes you to sweat) each week. In addition, most adults need muscle-strengthening exercises on 2 or more days a week.  Silver Sneakers may be a benefit available to you. To determine eligibility, you may visit the website: www.silversneakers.com or contact program at 234-126-6900 Mon-Fri between 8AM-8PM.   . Maintain a healthy weight. The body mass index (BMI) is a screening tool to identify possible weight problems. It provides an estimate of body fat based on height and weight. Your health care provider can find your BMI and can help you achieve or maintain a healthy weight.   For adults 20 years and older: ? A BMI below 18.5 is considered underweight. ? A BMI of 18.5 to 24.9 is normal. ? A BMI of 25 to 29.9 is considered overweight. ? A BMI of 30 and above is considered obese.   . Maintain normal blood lipids and cholesterol levels by exercising and minimizing your intake of saturated fat. Eat a balanced diet with plenty of fruit and vegetables. Blood tests for lipids and cholesterol should begin at age 23 and be repeated every 5 years. If your lipid or  cholesterol levels are high, you are over 50, or you are at high risk for heart disease, you may need your cholesterol levels checked more frequently. Ongoing high lipid and cholesterol levels should be treated with medicines if diet and exercise are not working.  . If you smoke, find out from your health care provider how to quit. If you do not use tobacco, please do not start.  . If you choose to drink alcohol, please do not consume more than 2 drinks per day. One drink is considered to be 12 ounces (355 mL) of beer, 5 ounces  (148 mL) of wine, or 1.5 ounces (44 mL) of liquor.  . If you are 52-57 years old, ask your health care provider if you should take aspirin to prevent strokes.  . Use sunscreen. Apply sunscreen liberally and repeatedly throughout the day. You should seek shade when your shadow is shorter than you. Protect yourself by wearing long sleeves, pants, a wide-brimmed hat, and sunglasses year round, whenever you are outdoors.  . Once a month, do a whole body skin exam, using a mirror to look at the skin on your back. Tell your health care provider of new moles, moles that have irregular borders, moles that are larger than a pencil eraser, or moles that have changed in shape or color.

## 2015-09-29 NOTE — Progress Notes (Signed)
I reviewed health advisor's note, was available for consultation, and agree with documentation and plan.  

## 2015-09-30 LAB — PARATHYROID HORMONE, INTACT (NO CA): PTH: 70 pg/mL — ABNORMAL HIGH (ref 14–64)

## 2015-10-04 ENCOUNTER — Encounter: Payer: Self-pay | Admitting: Family Medicine

## 2015-10-04 ENCOUNTER — Ambulatory Visit (INDEPENDENT_AMBULATORY_CARE_PROVIDER_SITE_OTHER): Payer: PPO | Admitting: Family Medicine

## 2015-10-04 VITALS — BP 132/60 | HR 62 | Temp 98.6°F | Ht 65.0 in | Wt 154.8 lb

## 2015-10-04 DIAGNOSIS — Z853 Personal history of malignant neoplasm of breast: Secondary | ICD-10-CM

## 2015-10-04 DIAGNOSIS — I10 Essential (primary) hypertension: Secondary | ICD-10-CM

## 2015-10-04 DIAGNOSIS — Z0000 Encounter for general adult medical examination without abnormal findings: Secondary | ICD-10-CM | POA: Insufficient documentation

## 2015-10-04 DIAGNOSIS — E785 Hyperlipidemia, unspecified: Secondary | ICD-10-CM

## 2015-10-04 DIAGNOSIS — N183 Chronic kidney disease, stage 3 (moderate): Secondary | ICD-10-CM

## 2015-10-04 DIAGNOSIS — E119 Type 2 diabetes mellitus without complications: Secondary | ICD-10-CM

## 2015-10-04 DIAGNOSIS — E1122 Type 2 diabetes mellitus with diabetic chronic kidney disease: Secondary | ICD-10-CM

## 2015-10-04 MED ORDER — LOSARTAN POTASSIUM 50 MG PO TABS: 50.0000 mg | ORAL_TABLET | Freq: Every day | ORAL | 3 refills | Status: DC

## 2015-10-04 NOTE — Assessment & Plan Note (Signed)
Followed by Dr Jamal Collin - pending mammogram later this month

## 2015-10-04 NOTE — Assessment & Plan Note (Signed)
Chronic, stable. D/c hctz, monitor renal function.

## 2015-10-04 NOTE — Assessment & Plan Note (Addendum)
Chronic, stable. Continue current regimen. Given endorsed hypotension, will stop HCTZ component, continue losartan 50mg  and low dose toprol XL. Monitor bp at home and return sooner if not remaining well controlled. Pt agrees.

## 2015-10-04 NOTE — Patient Instructions (Addendum)
Stop losartan/hctz, start plain losartan 24m daily. Sent to pharmacy.  You are doing well today. Make sure you stay well hydrated for kidneys.  Return as needed or in 1 year for next physical.  Health Maintenance, Female Adopting a healthy lifestyle and getting preventive care can go a long way to promote health and wellness. Talk with your health care provider about what schedule of regular examinations is right for you. This is a good chance for you to check in with your provider about disease prevention and staying healthy. In between checkups, there are plenty of things you can do on your own. Experts have done a lot of research about which lifestyle changes and preventive measures are most likely to keep you healthy. Ask your health care provider for more information. WEIGHT AND DIET  Eat a healthy diet  Be sure to include plenty of vegetables, fruits, low-fat dairy products, and lean protein.  Do not eat a lot of foods high in solid fats, added sugars, or salt.  Get regular exercise. This is one of the most important things you can do for your health.  Most adults should exercise for at least 150 minutes each week. The exercise should increase your heart rate and make you sweat (moderate-intensity exercise).  Most adults should also do strengthening exercises at least twice a week. This is in addition to the moderate-intensity exercise.  Maintain a healthy weight  Body mass index (BMI) is a measurement that can be used to identify possible weight problems. It estimates body fat based on height and weight. Your health care provider can help determine your BMI and help you achieve or maintain a healthy weight.  For females 248years of age and older:   A BMI below 18.5 is considered underweight.  A BMI of 18.5 to 24.9 is normal.  A BMI of 25 to 29.9 is considered overweight.  A BMI of 30 and above is considered obese.  Watch levels of cholesterol and blood lipids  You should  start having your blood tested for lipids and cholesterol at 80years of age, then have this test every 5 years.  You may need to have your cholesterol levels checked more often if:  Your lipid or cholesterol levels are high.  You are older than 80years of age.  You are at high risk for heart disease.  CANCER SCREENING   Lung Cancer  Lung cancer screening is recommended for adults [id number] old who are at high risk for lung cancer because of a history of smoking.  A yearly low-dose CT scan of the lungs is recommended for people who:  Currently smoke.  Have quit within the past 15 years.  Have at least a 30-pack-year history of smoking. A pack year is smoking an average of one pack of cigarettes a day for 1 year.  Yearly screening should continue until it has been 15 years since you quit.  Yearly screening should stop if you develop a health problem that would prevent you from having lung cancer treatment.  Breast Cancer  Practice breast self-awareness. This means understanding how your breasts normally appear and feel.  It also means doing regular breast self-exams. Let your health care provider know about any changes, no matter how small.  If you are in your 20s or 30s, you should have a clinical breast exam (CBE) by a health care provider every 1-3 years as part of a regular health exam.  If you are 40 or older,  have a CBE every year. Also consider having a breast X-ray (mammogram) every year.  If you have a family history of breast cancer, talk to your health care provider about genetic screening.  If you are at high risk for breast cancer, talk to your health care provider about having an MRI and a mammogram every year.  Breast cancer gene (BRCA) assessment is recommended for women who have family members with BRCA-related cancers. BRCA-related cancers include:  Breast.  Ovarian.  Tubal.  Peritoneal cancers.  Results of the assessment will determine the  need for genetic counseling and BRCA1 and BRCA2 testing. Cervical Cancer Your health care provider may recommend that you be screened regularly for cancer of the pelvic organs (ovaries, uterus, and vagina). This screening involves a pelvic examination, including checking for microscopic changes to the surface of your cervix (Pap test). You may be encouraged to have this screening done every 3 years, beginning at age 34.  For women ages 68-65, health care providers may recommend pelvic exams and Pap testing every 3 years, or they may recommend the Pap and pelvic exam, combined with testing for human papilloma virus (HPV), every 5 years. Some types of HPV increase your risk of cervical cancer. Testing for HPV may also be done on women of any age with unclear Pap test results.  Other health care providers may not recommend any screening for nonpregnant women who are considered low risk for pelvic cancer and who do not have symptoms. Ask your health care provider if a screening pelvic exam is right for you.  If you have had past treatment for cervical cancer or a condition that could lead to cancer, you need Pap tests and screening for cancer for at least 20 years after your treatment. If Pap tests have been discontinued, your risk factors (such as having a new sexual partner) need to be reassessed to determine if screening should resume. Some women have medical problems that increase the chance of getting cervical cancer. In these cases, your health care provider may recommend more frequent screening and Pap tests. Colorectal Cancer  This type of cancer can be detected and often prevented.  Routine colorectal cancer screening usually begins at 80 years of age and continues through 80 years of age.  Your health care provider may recommend screening at an earlier age if you have risk factors for colon cancer.  Your health care provider may also recommend using home test kits to check for hidden blood in  the stool.  A small camera at the end of a tube can be used to examine your colon directly (sigmoidoscopy or colonoscopy). This is done to check for the earliest forms of colorectal cancer.  Routine screening usually begins at age 46.  Direct examination of the colon should be repeated every 5-10 years through 80 years of age. However, you may need to be screened more often if early forms of precancerous polyps or small growths are found. Skin Cancer  Check your skin from head to toe regularly.  Tell your health care provider about any new moles or changes in moles, especially if there is a change in a mole's shape or color.  Also tell your health care provider if you have a mole that is larger than the size of a pencil eraser.  Always use sunscreen. Apply sunscreen liberally and repeatedly throughout the day.  Protect yourself by wearing long sleeves, pants, a wide-brimmed hat, and sunglasses whenever you are outside. HEART DISEASE, DIABETES, AND  HIGH BLOOD PRESSURE   High blood pressure causes heart disease and increases the risk of stroke. High blood pressure is more likely to develop in:  People who have blood pressure in the high end of the normal range (130-139/85-89 mm Hg).  People who are overweight or obese.  People who are African American.  If you are 82-58 years of age, have your blood pressure checked every 3-5 years. If you are 8 years of age or older, have your blood pressure checked every year. You should have your blood pressure measured twice--once when you are at a hospital or clinic, and once when you are not at a hospital or clinic. Record the average of the two measurements. To check your blood pressure when you are not at a hospital or clinic, you can use:  An automated blood pressure machine at a pharmacy.  A home blood pressure monitor.  If you are between 75 years and 23 years old, ask your health care provider if you should take aspirin to prevent  strokes.  Have regular diabetes screenings. This involves taking a blood sample to check your fasting blood sugar level.  If you are at a normal weight and have a low risk for diabetes, have this test once every three years after 80 years of age.  If you are overweight and have a high risk for diabetes, consider being tested at a younger age or more often. PREVENTING INFECTION  Hepatitis B  If you have a higher risk for hepatitis B, you should be screened for this virus. You are considered at high risk for hepatitis B if:  You were born in a country where hepatitis B is common. Ask your health care provider which countries are considered high risk.  Your parents were born in a high-risk country, and you have not been immunized against hepatitis B (hepatitis B vaccine).  You have HIV or AIDS.  You use needles to inject street drugs.  You live with someone who has hepatitis B.  You have had sex with someone who has hepatitis B.  You get hemodialysis treatment.  You take certain medicines for conditions, including cancer, organ transplantation, and autoimmune conditions. Hepatitis C  Blood testing is recommended for:  Everyone born from [birth year] through 1963.  Anyone with known risk factors for hepatitis C. Sexually transmitted infections (STIs)  You should be screened for sexually transmitted infections (STIs) including gonorrhea and chlamydia if:  You are sexually active and are younger than 80 years of age.  You are older than 80 years of age and your health care provider tells you that you are at risk for this type of infection.  Your sexual activity has changed since you were last screened and you are at an increased risk for chlamydia or gonorrhea. Ask your health care provider if you are at risk.  If you do not have HIV, but are at risk, it may be recommended that you take a prescription medicine daily to prevent HIV infection. This is called pre-exposure prophylaxis  (PrEP). You are considered at risk if:  You are sexually active and do not regularly use condoms or know the HIV status of your partner(s).  You take drugs by injection.  You are sexually active with a partner who has HIV. Talk with your health care provider about whether you are at high risk of being infected with HIV. If you choose to begin PrEP, you should first be tested for HIV. You should then be tested  every 3 months for as long as you are taking PrEP.  PREGNANCY   If you are premenopausal and you may become pregnant, ask your health care provider about preconception counseling.  If you may become pregnant, take 400 to 800 micrograms (mcg) of folic acid every day.  If you want to prevent pregnancy, talk to your health care provider about birth control (contraception). OSTEOPOROSIS AND MENOPAUSE   Osteoporosis is a disease in which the bones lose minerals and strength with aging. This can result in serious bone fractures. Your risk for osteoporosis can be identified using a bone density scan.  If you are 58 years of age or older, or if you are at risk for osteoporosis and fractures, ask your health care provider if you should be screened.  Ask your health care provider whether you should take a calcium or vitamin D supplement to lower your risk for osteoporosis.  Menopause may have certain physical symptoms and risks.  Hormone replacement therapy may reduce some of these symptoms and risks. Talk to your health care provider about whether hormone replacement therapy is right for you.  HOME CARE INSTRUCTIONS   Schedule regular health, dental, and eye exams.  Stay current with your immunizations.   Do not use any tobacco products including cigarettes, chewing tobacco, or electronic cigarettes.  If you are pregnant, do not drink alcohol.  If you are breastfeeding, limit how much and how often you drink alcohol.  Limit alcohol intake to no more than 1 drink per day for  nonpregnant women. One drink equals 12 ounces of beer, 5 ounces of wine, or 1 ounces of hard liquor.  Do not use street drugs.  Do not share needles.  Ask your health care provider for help if you need support or information about quitting drugs.  Tell your health care provider if you often feel depressed.  Tell your health care provider if you have ever been abused or do not feel safe at home.   This information is not intended to replace advice given to you by your health care provider. Make sure you discuss any questions you have with your health care provider.   Document Released: 09/01/2010 Document Revised: 03/09/2014 Document Reviewed: 01/18/2013 Elsevier Interactive Patient Education [employer].

## 2015-10-04 NOTE — Progress Notes (Signed)
Pre visit review using our clinic review tool, if applicable. No additional management support is needed unless otherwise documented below in the visit note. 

## 2015-10-04 NOTE — Assessment & Plan Note (Signed)
Chronic, stable. Continue current regimen of lovastatin and krill oil.

## 2015-10-04 NOTE — Progress Notes (Signed)
BP 132/60   Pulse 62   Temp 98.6 F (37 C) (Oral)   Ht 5\' 5"  (1.651 m)   Wt 154 lb 12 oz (70.2 kg)   SpO2 98%   BMI 25.75 kg/m    CC: CPE Subjective:    Patient ID: Paige Medina, female    DOB: 1933-09-07, 82 y.o.   MRN: [id number]  HPI: Paige Medina is a 82 y.o. female presenting on 10/04/2015 for Annual Exam   Saw Katha Cabal last week for medicare wellness visit, note reviewed.   Occasional hypotension with dizziness.  Preventative:  COLONOSCOPY 12/2014 mod diverticulosis o/w WNL, f/u prn [name]) Well woman - s/p hysterectomy 1977 Mammogram 10/2014 pending - scheduled by Dr. Jamal Collin. H/o breast cancer followed closely by Dr. Jamal Collin.  Dexa - 08/2012 - WNL Flu - yearly Pneumovax 06/2005. prevnar 2015 Td 06/2005  zostavax 04/2005 Advanced directives: HCPOA are daughter and husband Beki Greason. Does not want prolonged life support. Seat belt use discussed Sunscreen use discussed. No changing moles on skin.  Lives with husband, no pets  Grown children  Occupation: retired, varied jobs Veterinary surgeon  Activity: gym 3d/wk, active mentally with crosswords  Diet: good water, fruits/vegetables daily   Relevant past medical, surgical, family and social history reviewed and updated as indicated. Interim medical history since our last visit reviewed. Allergies and medications reviewed and updated. Current Outpatient Prescriptions on File Prior to Visit  Medication Sig  . acetaminophen (TYLENOL) 500 MG tablet Take 500 mg by mouth as needed.  . Ascorbic Acid (VITAMIN C PO) Take by mouth as needed.  . beta carotene w/minerals (OCUVITE) tablet Take 1 tablet by mouth daily.  . Calcium Carbonate-Vitamin D (CALCIUM 600 + D PO) Take 1 tablet by mouth daily.   . clobetasol (TEMOVATE) 0.05 % external solution Apply 1 application topically daily as needed.  . Coenzyme Q10 (COQ-10) 100 MG CAPS Take 1 capsule by mouth daily.  Mariane Baumgarten Calcium (STOOL SOFTENER PO) Take 1 capsule by  mouth daily.   [name] glucose blood test strip Use as instructed  . hydrocortisone 1 % ointment Apply 1 application topically 2 (two) times daily.  [name] ketoconazole (NIZORAL) 2 % cream Apply 1 application topically daily. (Patient taking differently: Apply 1 application topically daily as needed. )  . Krill Oil 1000 MG CAPS Take 1 capsule by mouth daily.  [name] lovastatin (MEVACOR) 40 MG tablet Take 1 tablet (40 mg total) by mouth at bedtime.  . metoprolol succinate (TOPROL-XL) 25 MG 24 hr tablet TAKE 1/2 TABLET BY MOUTH ONCE DAILY. TAKE WITH A MEAL.  . Probiotic Product (PROBIOTIC DAILY PO) Take 1 capsule by mouth daily.   . Zinc Sulfate (ZINC 15 PO) Take 15 mg by mouth as needed (with cold symptoms). Reported on 08/16/2015  . estradiol (ESTRACE) 0.1 MG/GM vaginal cream Place 1 Applicatorful vaginally 2 (two) times a week. (Patient not taking: Reported on 10/04/2015)   No current facility-administered medications on file prior to visit.     Review of Systems  Constitutional: Negative for activity change, appetite change, chills, fatigue, fever and unexpected weight change.  HENT: Negative for hearing loss.   Eyes: Negative for visual disturbance.  Respiratory: Negative for cough, chest tightness, shortness of breath and wheezing.   Cardiovascular: Negative for chest pain, palpitations and leg swelling.  Gastrointestinal: Negative for abdominal distention, abdominal pain, blood in stool, constipation, diarrhea, nausea and vomiting.  Genitourinary: Negative for difficulty urinating and hematuria.  Musculoskeletal: Negative for arthralgias,  myalgias and neck pain.  Skin: Negative for rash.  Neurological: Negative for dizziness, seizures, syncope and headaches.  Hematological: Negative for adenopathy. Does not bruise/bleed easily.  Psychiatric/Behavioral: Negative for dysphoric mood. The patient is not nervous/anxious.    Per HPI unless specifically indicated in ROS section     Objective:    BP  132/60   Pulse 62   Temp 98.6 F (37 C) (Oral)   Ht 5\' 5"  (1.651 m)   Wt 154 lb 12 oz (70.2 kg)   SpO2 98%   BMI 25.75 kg/m   Wt Readings from Last 3 Encounters:  10/04/15 154 lb 12 oz (70.2 kg)  09/27/15 153 lb 8 oz (69.6 kg)  08/16/15 156 lb (70.8 kg)    Physical Exam  Constitutional: She is oriented to person, place, and time. She appears well-developed and well-nourished. No distress.  HENT:  Head: Normocephalic and atraumatic.  Right Ear: Hearing, tympanic membrane, external ear and ear canal normal.  Left Ear: Hearing, tympanic membrane, external ear and ear canal normal.  Nose: Nose normal.  Mouth/Throat: Uvula is midline, oropharynx is clear and moist and mucous membranes are normal. No oropharyngeal exudate, posterior oropharyngeal edema or posterior oropharyngeal erythema.  Eyes: Conjunctivae and EOM are normal. Pupils are equal, round, and reactive to light. No scleral icterus.  Neck: Normal range of motion. Neck supple. Carotid bruit is not present. No thyromegaly present.  Cardiovascular: Normal rate, regular rhythm and intact distal pulses.   Murmur (mild) heard. Pulses:      Radial pulses are 2+ on the right side, and 2+ on the left side.  Pulmonary/Chest: Effort normal and breath sounds normal. No respiratory distress. She has no wheezes. She has no rales.  Abdominal: Soft. Bowel sounds are normal. She exhibits no distension and no mass. There is no tenderness. There is no rebound and no guarding.  Musculoskeletal: Normal range of motion. She exhibits no edema.  Lymphadenopathy:    She has no cervical adenopathy.  Neurological: She is alert and oriented to person, place, and time.  CN grossly intact, station and gait intact  Skin: Skin is warm and dry. No rash noted.  Psychiatric: She has a normal mood and affect. Her behavior is normal. Judgment and thought content normal.  Nursing note and vitals reviewed.  Results for orders placed or performed in visit on  09/27/15  Lipid panel  Result Value Ref Range   Cholesterol 165 0 - 200 mg/dL   Triglycerides 140.0 0.0 - 149.0 mg/dL   HDL 47.40 >39.00 mg/dL   VLDL 28.0 0.0 - 40.0 mg/dL   LDL Cholesterol 90 0 - 99 mg/dL   Total CHOL/HDL Ratio 3    NonHDL 117.94   Hemoglobin A1c  Result Value Ref Range   Hgb A1c MFr Bld 6.9 (H) 4.6 - 6.5 %  Renal function panel  Result Value Ref Range   Sodium 139 135 - 145 mEq/L   Potassium 4.2 3.5 - 5.1 mEq/L   Chloride 104 96 - 112 mEq/L   CO2 29 19 - 32 mEq/L   Calcium 9.5 8.4 - 10.5 mg/dL   Albumin 4.1 3.5 - 5.2 g/dL   BUN 24 (H) 6 - 23 mg/dL   Creatinine, Ser 1.24 (H) 0.40 - 1.20 mg/dL   Glucose, Bld 136 (H) 70 - 99 mg/dL   Phosphorus 3.2 2.3 - 4.6 mg/dL   GFR 44.19 (L) >60.00 mL/min  CBC with Differential/Platelet  Result Value Ref Range   WBC  8.4 4.0 - 10.5 K/uL   RBC 4.34 3.87 - 5.11 Mil/uL   Hemoglobin 13.4 12.0 - 15.0 g/dL   HCT 39.9 36.0 - 46.0 %   MCV 92.1 78.0 - 100.0 fl   MCHC 33.7 30.0 - 36.0 g/dL   RDW 13.4 11.5 - 15.5 %   Platelets 239.0 150.0 - 400.0 K/uL   Neutrophils Relative % 62.1 43.0 - 77.0 %   Lymphocytes Relative 27.1 12.0 - 46.0 %   Monocytes Relative 7.4 3.0 - 12.0 %   Eosinophils Relative 2.7 0.0 - 5.0 %   Basophils Relative 0.7 0.0 - 3.0 %   Neutro Abs 5.2 1.4 - 7.7 K/uL   Lymphs Abs 2.3 0.7 - 4.0 K/uL   Monocytes Absolute 0.6 0.1 - 1.0 K/uL   Eosinophils Absolute 0.2 0.0 - 0.7 K/uL   Basophils Absolute 0.1 0.0 - 0.1 K/uL  VITAMIN D 25 Hydroxy (Vit-D Deficiency, Fractures)  Result Value Ref Range   VITD 31.08 30.00 - 100.00 ng/mL  Parathyroid hormone, intact (no Ca)  Result Value Ref Range   PTH 70 (H) 14 - 64 pg/mL      Assessment & Plan:   Problem List Items Addressed This Visit    CKD stage 3 due to type 2 diabetes mellitus (HCC)    Chronic, stable. D/c hctz, monitor renal function.       Relevant Medications   losartan (COZAAR) 50 MG tablet   Diabetes mellitus without complication (HCC)    Chronic.  Diet controlled.       Relevant Medications   losartan (COZAAR) 50 MG tablet   Health maintenance examination - Primary    Preventative protocols reviewed and updated unless pt declined. Discussed healthy diet and lifestyle.       History of breast cancer    Followed by Dr Jamal Collin - pending mammogram later this month       Hyperlipidemia    Chronic, stable. Continue current regimen of lovastatin and krill oil.      Relevant Medications   losartan (COZAAR) 50 MG tablet   Hypertension    Chronic, stable. Continue current regimen. Given endorsed hypotension, will stop HCTZ component, continue losartan 50mg  and low dose toprol XL. Monitor bp at home and return sooner if not remaining well controlled. Pt agrees.       Relevant Medications   losartan (COZAAR) 50 MG tablet    Other Visit Diagnoses   None.      Follow up plan: Return in about 1 year (around 10/03/2016), or as needed , for annual exam, prior fasting for blood work.  [name], MD

## 2015-10-04 NOTE — Assessment & Plan Note (Addendum)
Chronic Diet controlled 

## 2015-10-04 NOTE — Assessment & Plan Note (Signed)
Preventative protocols reviewed and updated unless pt declined. Discussed healthy diet and lifestyle.  

## 2015-10-18 ENCOUNTER — Ambulatory Visit
Admission: RE | Admit: 2015-10-18 | Discharge: 2015-10-18 | Disposition: A | Discharge status: Home / Self Care | Payer: PPO | Source: Ambulatory Visit | Attending: General Surgery | Admitting: General Surgery

## 2015-10-18 ENCOUNTER — Other Ambulatory Visit: Payer: Self-pay | Admitting: General Surgery

## 2015-10-18 DIAGNOSIS — C50912 Malignant neoplasm of unspecified site of left female breast: Secondary | ICD-10-CM

## 2015-10-18 DIAGNOSIS — Z853 Personal history of malignant neoplasm of breast: Secondary | ICD-10-CM | POA: Insufficient documentation

## 2015-10-18 DIAGNOSIS — R928 Other abnormal and inconclusive findings on diagnostic imaging of breast: Secondary | ICD-10-CM | POA: Diagnosis not present

## 2015-10-18 HISTORY — DX: Malignant neoplasm of unspecified site of unspecified female breast: C50.919

## 2015-10-22 ENCOUNTER — Ambulatory Visit (INDEPENDENT_AMBULATORY_CARE_PROVIDER_SITE_OTHER): Payer: PPO | Admitting: General Surgery

## 2015-10-22 VITALS — BP 132/78 | HR 84 | Resp 14 | Ht 65.7 in | Wt 155.0 lb

## 2015-10-22 DIAGNOSIS — C50912 Malignant neoplasm of unspecified site of left female breast: Secondary | ICD-10-CM
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
## 2015-10-22 NOTE — Patient Instructions (Signed)
The patient has been asked to return to the office in one year with a bilateral diagnostic mammogram. 

## 2015-11-19 ENCOUNTER — Encounter: Payer: Self-pay | Admitting: Family Medicine

## 2015-11-19 ENCOUNTER — Ambulatory Visit (INDEPENDENT_AMBULATORY_CARE_PROVIDER_SITE_OTHER): Payer: PPO | Admitting: Family Medicine

## 2015-11-19 VITALS — BP 133/72 | HR 59 | Resp 20 | Wt 157.0 lb

## 2015-11-19 DIAGNOSIS — N7689 Other specified inflammation of vagina and vulva: Secondary | ICD-10-CM | POA: Diagnosis not present

## 2015-11-19 DIAGNOSIS — L309 Dermatitis, unspecified: Secondary | ICD-10-CM

## 2015-11-19 NOTE — Progress Notes (Signed)
   Subjective:    Patient ID: Paige Medina is a 82 y.o. female presenting with Follow-up  on 11/19/2015  HPI: Here today for f/u. Has used the steroid cream and area is much improved. She has flares occasionally and she just uses the ointment sparingly. Previously appeared to be some hyperkeratosis associated with mild trauma.  Review of Systems  Constitutional: Negative for chills and fever.  Respiratory: Negative for shortness of breath.   Cardiovascular: Negative for chest pain.  Gastrointestinal: Negative for abdominal pain, nausea and vomiting.  Genitourinary: Positive for vaginal pain (itching, irritation). Negative for dysuria.  Skin: Negative for rash.      Objective:    BP 133/72 (BP Location: Right Arm, Patient Position: Sitting, Cuff Size: Normal)   Pulse (!) 59   Resp 20   Wt 157 lb (71.2 kg)   BMI 25.57 kg/m  Physical Exam  Constitutional: She appears well-developed and well-nourished.  HENT:  Head: Normocephalic and atraumatic.  Eyes: No scleral icterus.  Neck: Neck supple.  Cardiovascular: Normal rate and regular rhythm.   Pulmonary/Chest: Effort normal.  Genitourinary:  Genitourinary Comments: Atrophic changes noted with pallor but otherwise normal external genitalia        Assessment & Plan:   Problem List Items Addressed This Visit      Unprioritized   Vulvar dermatitis - Primary    Improved with mild steroid - may continue prn       Other Visit Diagnoses   None.    Return if symptoms worsen or fail to improve.   Donnamae Jude 11/19/2015 8:35 AM

## 2015-11-19 NOTE — Assessment & Plan Note (Signed)
Improved with mild steroid - may continue prn

## 2015-12-10 ENCOUNTER — Ambulatory Visit (INDEPENDENT_AMBULATORY_CARE_PROVIDER_SITE_OTHER): Payer: PPO

## 2015-12-10 DIAGNOSIS — Z23 Encounter for immunization: Secondary | ICD-10-CM | POA: Diagnosis not present

## 2016-01-09 ENCOUNTER — Other Ambulatory Visit: Payer: Self-pay | Admitting: Family Medicine

## 2016-03-17 ENCOUNTER — Encounter: Payer: Self-pay | Admitting: Family Medicine

## 2016-03-17 ENCOUNTER — Ambulatory Visit (INDEPENDENT_AMBULATORY_CARE_PROVIDER_SITE_OTHER): Payer: PPO | Admitting: Family Medicine

## 2016-03-17 VITALS — BP 130/62 | HR 58 | Temp 98.5°F | Ht 65.75 in | Wt 155.5 lb

## 2016-03-17 DIAGNOSIS — I10 Essential (primary) hypertension: Secondary | ICD-10-CM | POA: Diagnosis not present

## 2016-03-17 LAB — COMPREHENSIVE METABOLIC PANEL
ALBUMIN: 4.3 g/dL (ref 3.5–5.2)
ALT: 22 U/L (ref 0–35)
AST: 20 U/L (ref 0–37)
Alkaline Phosphatase: 67 U/L (ref 39–117)
BILIRUBIN TOTAL: 0.5 mg/dL (ref 0.2–1.2)
BUN: 20 mg/dL (ref 6–23)
CALCIUM: 9.9 mg/dL (ref 8.4–10.5)
CHLORIDE: 104 meq/L (ref 96–112)
CO2: 31 meq/L (ref 19–32)
CREATININE: 1.17 mg/dL (ref 0.40–1.20)
GFR: 47.2 mL/min — ABNORMAL LOW (ref >60.00)
Glucose, Bld: 111 mg/dL — ABNORMAL HIGH (ref 70–99)
Potassium: 5.1 mEq/L (ref 3.5–5.1)
SODIUM: 142 meq/L (ref 135–145)
Total Protein: 7.6 g/dL (ref 6.0–8.3)

## 2016-03-17 LAB — CBC WITH DIFFERENTIAL/PLATELET
BASOS PCT: 0.9 % (ref 0.0–3.0)
Basophils Absolute: 0.1 10*3/uL (ref 0.0–0.1)
Eosinophils Absolute: 0.2 10*3/uL (ref 0.0–0.7)
Eosinophils Relative: 2.1 % (ref 0.0–5.0)
HCT: 43.2 % (ref 36.0–46.0)
Hemoglobin: 14.6 g/dL (ref 12.0–15.0)
LYMPHS ABS: 2.4 10*3/uL (ref 0.7–4.0)
LYMPHS PCT: 24.4 % (ref 12.0–46.0)
MCHC: 33.7 g/dL (ref 30.0–36.0)
MCV: 93.7 fl (ref 78.0–100.0)
MONOS PCT: 6.5 % (ref 3.0–12.0)
Monocytes Absolute: 0.6 10*3/uL (ref 0.1–1.0)
NEUTROS ABS: 6.4 10*3/uL (ref 1.4–7.7)
NEUTROS PCT: 66.1 % (ref 43.0–77.0)
PLATELETS: 280 10*3/uL (ref 150.0–400.0)
RBC: 4.61 Mil/uL (ref 3.87–5.11)
RDW: 13.2 % (ref 11.5–15.5)
WBC: 9.7 10*3/uL (ref 4.0–10.5)

## 2016-03-17 LAB — T3, FREE: T3, Free: 3 pg/mL (ref 2.3–4.2)

## 2016-03-17 LAB — T4, FREE: Free T4: 0.73 ng/dL (ref 0.60–1.60)

## 2016-03-17 LAB — TSH: TSH: 2.07 u[IU]/mL (ref 0.35–4.50)

## 2016-03-17 MED ORDER — LOSARTAN POTASSIUM 100 MG PO TABS: 100.0000 mg | ORAL_TABLET | Freq: Every day | ORAL | 3 refills | Status: DC

## 2016-03-17 NOTE — Progress Notes (Signed)
   Subjective:    Patient ID: Paige Medina, female    DOB: Jun 26, 1933, 83 y.o.   MRN: [id number]  HPI   83 year old female with history of  DM< HTN presents for BP check.   She reports that her BP has been higher last night.  She was feeling changes.g lightheaded and quivery.. So checked BP   164/88 , then211/82. No CP, no SOB.  No neuro changes.  No swelling Does have some decreased energy.  No increase stress, or anxiety.  No new meds, no decongestant.   She took extra dose of both meds last night.   BP Readings from Last 3 Encounters:  03/17/16 130/62  11/19/15 133/72  10/22/15 132/78    She is on losartan 50 mg daily and metoprolol 25 mg XL.  She is worried about possible SE to these meds.    Vitals:   03/17/16 1004  BP: 130/62  Pulse: (!) 58  Temp: 98.5 F (36.9 C)   Review of Systems  Constitutional: Negative for fatigue and fever.  HENT: Negative for ear pain.   Eyes: Negative for pain.  Respiratory: Negative for chest tightness and shortness of breath.   Cardiovascular: Negative for chest pain, palpitations and leg swelling.  Gastrointestinal: Negative for abdominal pain.  Genitourinary: Negative for dysuria.       Objective:   Physical Exam  Constitutional: Vital signs are normal. She appears well-developed and well-nourished. She is cooperative.  Non-toxic appearance. She does not appear ill. No distress.  Appears younger than stated age.  HENT:  Head: Normocephalic.  Right Ear: Hearing, tympanic membrane, external ear and ear canal normal. Tympanic membrane is not erythematous, not retracted and not bulging.  Left Ear: Hearing, tympanic membrane, external ear and ear canal normal. Tympanic membrane is not erythematous, not retracted and not bulging.  Nose: No mucosal edema or rhinorrhea. Right sinus exhibits no maxillary sinus tenderness and no frontal sinus tenderness. Left sinus exhibits no maxillary sinus tenderness and no frontal sinus  tenderness.  Mouth/Throat: Uvula is midline, oropharynx is clear and moist and mucous membranes are normal.  Eyes: Conjunctivae, EOM and lids are normal. Pupils are equal, round, and reactive to light. Lids are everted and swept, no foreign bodies found.  Neck: Trachea normal and normal range of motion. Neck supple. Carotid bruit is not present. No thyroid mass and no thyromegaly present.  Cardiovascular: Normal rate, regular rhythm, S1 normal, S2 normal, normal heart sounds, intact distal pulses and normal pulses.  Exam reveals no gallop and no friction rub.   No murmur heard. Pulmonary/Chest: Effort normal and breath sounds normal. No tachypnea. No respiratory distress. She has no decreased breath sounds. She has no wheezes. She has no rhonchi. She has no rales.  Abdominal: Soft. Normal appearance and bowel sounds are normal. There is no tenderness.  Neurological: She is alert.  Skin: Skin is warm, dry and intact. No rash noted.  Psychiatric: Her speech is normal and behavior is normal. Judgment and thought content normal. Her mood appears not anxious. Cognition and memory are normal. She does not exhibit a depressed mood.          Assessment & Plan:

## 2016-03-17 NOTE — Progress Notes (Signed)
Pre visit review using our clinic review tool, if applicable. No additional management support is needed unless otherwise documented below in the visit note. 

## 2016-03-17 NOTE — Patient Instructions (Signed)
Stop at lab on way out.  Follow blood pressure at home goal < 140/90.  Increase losartan to 100 mg daily .[name] Take first dose of higher dose tommorow.  Follow up 2 week  PCP  For BP check.

## 2016-03-17 NOTE — Assessment & Plan Note (Signed)
No clear cause. No change in diet, meds. Eval for possible cause of BP increase with labs.. Cbc, thyroid and CMET.  IMproved with increase in boith med.  Pulse to low.  Will have her return to nml dose of BBlocker but increase ARB to 100 mg daily.

## 2016-03-30 DIAGNOSIS — Z1283 Encounter for screening for malignant neoplasm of skin: Secondary | ICD-10-CM | POA: Diagnosis not present

## 2016-03-30 DIAGNOSIS — D1800 Hemangioma unspecified site: Secondary | ICD-10-CM | POA: Diagnosis not present

## 2016-03-30 DIAGNOSIS — L718 Other rosacea: Secondary | ICD-10-CM | POA: Diagnosis not present

## 2016-03-30 DIAGNOSIS — L821 Other seborrheic keratosis: Secondary | ICD-10-CM | POA: Diagnosis not present

## 2016-03-30 DIAGNOSIS — L280 Lichen simplex chronicus: Secondary | ICD-10-CM | POA: Diagnosis not present

## 2016-03-30 DIAGNOSIS — D229 Melanocytic nevi, unspecified: Secondary | ICD-10-CM | POA: Diagnosis not present

## 2016-03-30 DIAGNOSIS — L578 Other skin changes due to chronic exposure to nonionizing radiation: Secondary | ICD-10-CM | POA: Diagnosis not present

## 2016-03-30 DIAGNOSIS — R21 Rash and other nonspecific skin eruption: Secondary | ICD-10-CM | POA: Diagnosis not present

## 2016-03-30 DIAGNOSIS — L820 Inflamed seborrheic keratosis: Secondary | ICD-10-CM | POA: Diagnosis not present

## 2016-03-30 DIAGNOSIS — Z85828 Personal history of other malignant neoplasm of skin: Secondary | ICD-10-CM | POA: Diagnosis not present

## 2016-03-30 DIAGNOSIS — L812 Freckles: Secondary | ICD-10-CM | POA: Diagnosis not present

## 2016-03-30 DIAGNOSIS — L309 Dermatitis, unspecified: Secondary | ICD-10-CM | POA: Diagnosis not present

## 2016-03-31 ENCOUNTER — Ambulatory Visit (INDEPENDENT_AMBULATORY_CARE_PROVIDER_SITE_OTHER): Payer: PPO | Admitting: Family Medicine

## 2016-03-31 ENCOUNTER — Encounter: Payer: Self-pay | Admitting: Family Medicine

## 2016-03-31 VITALS — BP 142/72 | HR 60 | Temp 98.0°F | Wt 155.2 lb

## 2016-03-31 DIAGNOSIS — N183 Chronic kidney disease, stage 3 unspecified: Secondary | ICD-10-CM

## 2016-03-31 DIAGNOSIS — I10 Essential (primary) hypertension: Secondary | ICD-10-CM | POA: Diagnosis not present

## 2016-03-31 DIAGNOSIS — E1122 Type 2 diabetes mellitus with diabetic chronic kidney disease: Secondary | ICD-10-CM

## 2016-03-31 DIAGNOSIS — E119 Type 2 diabetes mellitus without complications: Secondary | ICD-10-CM | POA: Diagnosis not present

## 2016-03-31 LAB — RENAL FUNCTION PANEL
ALBUMIN: 4 g/dL (ref 3.5–5.2)
BUN: 20 mg/dL (ref 6–23)
CALCIUM: 9.7 mg/dL (ref 8.4–10.5)
CO2: 31 mEq/L (ref 19–32)
Chloride: 105 mEq/L (ref 96–112)
Creatinine, Ser: 1.17 mg/dL (ref 0.40–1.20)
GFR: 47.2 mL/min — ABNORMAL LOW (ref >60.00)
GLUCOSE: 127 mg/dL — AB (ref 70–99)
POTASSIUM: 5 meq/L (ref 3.5–5.1)
Phosphorus: 3.6 mg/dL (ref 2.3–4.6)
SODIUM: 140 meq/L (ref 135–145)

## 2016-03-31 LAB — HEMOGLOBIN A1C: HEMOGLOBIN A1C: 6.9 % — AB (ref 4.6–6.5)

## 2016-03-31 NOTE — Progress Notes (Signed)
Pre visit review using our clinic review tool, if applicable. No additional management support is needed unless otherwise documented below in the visit note. 

## 2016-03-31 NOTE — Progress Notes (Signed)
BP (!) 142/72   Pulse 60   Temp 98 F (36.7 C) (Oral)   Wt 155 lb 4 oz (70.4 kg)   BMI 25.25 kg/m    CC: 2wk f/u visit Subjective:    Patient ID: Paige Medina, female    DOB: Oct 19, 1933, 83 y.o.   MRN: [id number]  HPI: Paige Medina is a 83 y.o. female presenting on 03/31/2016 for Follow-up   HTN - Compliant with current antihypertensive regimen of losartan, toprol XL. Last visit we stopped hctz due to some endorsed orthostatic dizziness. Seen here last week with isolated elevated blood pressures of unclear cause - labs reviewed. Losartan was increased to 100mg  daily. Does check blood pressures at home: 113-146/60-80, HR 50-60. No low blood pressure readings or symptoms of dizziness/syncope.  Denies HA, vision changes, CP/tightness, SOB, leg swelling.    HLD - compliant with lovastatin and krill oil.  DM - regularly does not check sugars. Compliant with antihyperglycemic regimen which includes: diet-controlled. Denies low sugars or hypoglycemic symptoms.  Denies paresthesias. Last diabetic eye exam 04/2015.  Pneumovax: 2007.  Prevnar: 2015. Lab Results  Component Value Date   HGBA1C 6.9 (H) 09/27/2015   Diabetic Foot Exam - Simple   No data filed    not done.    Relevant past medical, surgical, family and social history reviewed and updated as indicated. Interim medical history since our last visit reviewed. Allergies and medications reviewed and updated. Current Outpatient Prescriptions on File Prior to Visit  Medication Sig  . acetaminophen (TYLENOL) 500 MG tablet Take 500 mg by mouth as needed.  . Ascorbic Acid (VITAMIN C PO) Take by mouth as needed.  . beta carotene w/minerals (OCUVITE) tablet Take 1 tablet by mouth daily.  . Calcium Carbonate-Vitamin D (CALCIUM 600 + D PO) Take 1 tablet by mouth daily.   . clobetasol (TEMOVATE) 0.05 % external solution Apply 1 application topically daily as needed.  . Coenzyme Q10 (COQ-10) 100 MG CAPS Take 1 capsule by mouth  daily.  Mariane Baumgarten Calcium (STOOL SOFTENER PO) Take 1 capsule by mouth daily.   [name] estradiol (ESTRACE) 0.1 MG/GM vaginal cream Place 1 Applicatorful vaginally 2 (two) times a week.  [name] glucose blood test strip Use as instructed  . hydrocortisone 1 % ointment Apply 1 application topically 2 (two) times daily.  [name] ketoconazole (NIZORAL) 2 % cream Apply 1 application topically daily.  Adriaan Maltese Docker Oil 1000 MG CAPS Take 1 capsule by mouth daily.  [name] losartan (COZAAR) 100 MG tablet Take 1 tablet (100 mg total) by mouth daily. PLEASE DO NOT FILL UNTIL PT CALLS.  [name] lovastatin (MEVACOR) 40 MG tablet TAKE ONE TABLET BY MOUTH EVERY NIGHT AT BEDTIME  . metoprolol succinate (TOPROL-XL) 25 MG 24 hr tablet TAKE 1/2 TABLET BY MOUTH ONCE DAILY. TAKE WITH A MEAL.  . Probiotic Product (PROBIOTIC DAILY PO) Take 1 capsule by mouth daily.   . Zinc Sulfate (ZINC 15 PO) Take 15 mg by mouth as needed (with cold symptoms). Reported on 08/16/2015   No current facility-administered medications on file prior to visit.     Review of Systems Per HPI unless specifically indicated in ROS section     Objective:    BP (!) 142/72   Pulse 60   Temp 98 F (36.7 C) (Oral)   Wt 155 lb 4 oz (70.4 kg)   BMI 25.25 kg/m   Wt Readings from Last 3 Encounters:  03/31/16 155 lb 4 oz (70.4 kg)  03/17/16 155 lb 8 oz (70.5 kg)  11/19/15 157 lb (71.2 kg)    Physical Exam  Constitutional: She appears well-developed and well-nourished. No distress.  HENT:  Head: Normocephalic and atraumatic.  Mouth/Throat: Oropharynx is clear and moist. No oropharyngeal exudate.  Eyes: Conjunctivae and EOM are normal. Pupils are equal, round, and reactive to light. No scleral icterus.  Neck: Normal range of motion. Neck supple. Carotid bruit is not present.  Cardiovascular: Normal rate, regular rhythm and intact distal pulses.   Murmur (2/6 SEM) heard. Pulmonary/Chest: Effort normal and breath sounds normal. No respiratory distress. She has no wheezes.  She has no rales.  Musculoskeletal: She exhibits no edema.  Skin: Skin is dry.  Psychiatric: She has a normal mood and affect.   Results for orders placed or performed in visit on 03/17/16  Comprehensive metabolic panel  Result Value Ref Range   Sodium 142 135 - 145 mEq/L   Potassium 5.1 3.5 - 5.1 mEq/L   Chloride 104 96 - 112 mEq/L   CO2 31 19 - 32 mEq/L   Glucose, Bld 111 (H) 70 - 99 mg/dL   BUN 20 6 - 23 mg/dL   Creatinine, Ser 1.17 0.40 - 1.20 mg/dL   Total Bilirubin 0.5 0.2 - 1.2 mg/dL   Alkaline Phosphatase 67 39 - 117 U/L   AST 20 0 - 37 U/L   ALT 22 0 - 35 U/L   Total Protein 7.6 6.0 - 8.3 g/dL   Albumin 4.3 3.5 - 5.2 g/dL   Calcium 9.9 8.4 - 10.5 mg/dL   GFR 47.20 (L) >60.00 mL/min  TSH  Result Value Ref Range   TSH 2.07 0.35 - 4.50 uIU/mL  T4, free  Result Value Ref Range   Free T4 0.73 0.60 - 1.60 ng/dL  T3, free  Result Value Ref Range   T3, Free 3.0 2.3 - 4.2 pg/mL  CBC with Differential/Platelet  Result Value Ref Range   WBC 9.7 4.0 - 10.5 K/uL   RBC 4.61 3.87 - 5.11 Mil/uL   Hemoglobin 14.6 12.0 - 15.0 g/dL   HCT 43.2 36.0 - 46.0 %   MCV 93.7 78.0 - 100.0 fl   MCHC 33.7 30.0 - 36.0 g/dL   RDW 13.2 11.5 - 15.5 %   Platelets 280.0 150.0 - 400.0 K/uL   Neutrophils Relative % 66.1 43.0 - 77.0 %   Lymphocytes Relative 24.4 12.0 - 46.0 %   Monocytes Relative 6.5 3.0 - 12.0 %   Eosinophils Relative 2.1 0.0 - 5.0 %   Basophils Relative 0.9 0.0 - 3.0 %   Neutro Abs 6.4 1.4 - 7.7 K/uL   Lymphs Abs 2.4 0.7 - 4.0 K/uL   Monocytes Absolute 0.6 0.1 - 1.0 K/uL   Eosinophils Absolute 0.2 0.0 - 0.7 K/uL   Basophils Absolute 0.1 0.0 - 0.1 K/uL      Assessment & Plan:   Problem List Items Addressed This Visit    CKD stage 3 due to type 2 diabetes mellitus ([place])    Update renal panel      Relevant Orders   Renal function panel   Hemoglobin A1c   Diabetes mellitus without complication ([facility])    Update A1c.       Relevant Orders   Renal function panel    Hemoglobin A1c   Hypertension - Primary    Improved readings on higher losartan dose - will continue.  Check Cr after ARB increase.  Follow up plan: No Follow-up on file.  Fleta Borgeson, MD   

## 2016-03-31 NOTE — Assessment & Plan Note (Signed)
Update A1c ?

## 2016-03-31 NOTE — Patient Instructions (Addendum)
Continue current medicines. Make sure you wait 30 minutes before checking blood pressure and any eating, drinking, caffeine, stress, exercise, being upset. All these things can raise blood pressures.  Labs today.  Keep appointment for physical in August. Let us know sooner if blood pressure starts rising again.

## 2016-03-31 NOTE — Assessment & Plan Note (Signed)
Update renal panel 

## 2016-03-31 NOTE — Assessment & Plan Note (Signed)
Improved readings on higher losartan dose - will continue.  Check Cr after ARB increase.

## 2016-05-01 ENCOUNTER — Encounter: Payer: Self-pay | Admitting: Family Medicine

## 2016-05-01 DIAGNOSIS — E119 Type 2 diabetes mellitus without complications: Secondary | ICD-10-CM | POA: Diagnosis not present

## 2016-05-01 LAB — HM DIABETES EYE EXAM

## 2016-05-10 ENCOUNTER — Other Ambulatory Visit: Payer: Self-pay | Admitting: Family Medicine

## 2016-05-19 ENCOUNTER — Ambulatory Visit (INDEPENDENT_AMBULATORY_CARE_PROVIDER_SITE_OTHER): Payer: PPO | Admitting: Family Medicine

## 2016-05-19 ENCOUNTER — Encounter: Payer: Self-pay | Admitting: Family Medicine

## 2016-05-19 VITALS — BP 162/80 | HR 57 | Resp 20 | Wt 157.0 lb

## 2016-05-19 DIAGNOSIS — N7689 Other specified inflammation of vagina and vulva: Secondary | ICD-10-CM

## 2016-05-19 DIAGNOSIS — L309 Dermatitis, unspecified: Secondary | ICD-10-CM

## 2016-05-19 MED ORDER — CLOBETASOL PROPIONATE 0.05 % EX CREA: 1.0000 "application " | TOPICAL_CREAM | Freq: Two times a day (BID) | CUTANEOUS | 1 refills | Status: DC

## 2016-05-19 NOTE — Progress Notes (Signed)
Pt here today for a vulvar check, has noticed some non itchy white patches on her vulva/labia.

## 2016-05-19 NOTE — Assessment & Plan Note (Signed)
?   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 Use 2x/daily with flares, then 2x/wk prn.

## 2016-05-19 NOTE — Patient Instructions (Addendum)
Lichen Sclerosus Lichen sclerosus is a skin problem. It can happen on any part of the body. It happens most often in the anal or genital areas. It can cause itching and discomfort. Treatment can help to control symptoms. This skin problem is not passed from one person to another (not contagious). The cause is not known. Follow these instructions at home:  Take over-the-counter and prescription medicines only as told by your doctor.  Use creams or ointments as told by your doctor.  Do not scratch the affected areas of skin.  Women should keep the vagina as clean and dry as they can.  Keep all follow-up visits as told by your doctor. This is important. Contact a doctor if:  Your redness, swelling, or pain gets worse.  You have fluid, blood, or pus coming from the area.  You have new patches (lesions) on your skin.  You have a fever.  You have pain during sex. This information is not intended to replace advice given to you by your health care provider. Make sure you discuss any questions you have with your health care provider. Document Released: 01/30/2008 Document Revised: 07/25/2015 Document Reviewed: 05/14/2014 Elsevier Interactive Patient Education  2017 [employer].

## 2016-05-19 NOTE — Progress Notes (Signed)
   Subjective:    Patient ID: Paige Medina is a 83 y.o. female presenting with Vulvar check  on 05/19/2016  HPI: While tending to her vaginal irritation, she noted a knot and some white areas and would like to get them checked. She has had a recent flare, uses mild steroid, estrace, or aquafor which will help to a degree.  Review of Systems    Objective:    BP (!) 162/80 (BP Location: Right Arm, Patient Position: Sitting, Cuff Size: Large)   Pulse (!) 57   Resp 20   Wt 157 lb (71.2 kg)   BMI 25.53 kg/m  Physical Exam  Constitutional: She is oriented to person, place, and time. She appears well-developed and well-nourished. No distress.  HENT:  Head: Normocephalic and atraumatic.  Eyes: No scleral icterus.  Neck: Neck supple.  Cardiovascular: Normal rate.   Pulmonary/Chest: Effort normal.  Abdominal: Soft.  Genitourinary:  Genitourinary Comments: Small area of hypopigmented area with minimal erythema noted at posterior fourchette. 2 small inclusions in the skin.  Neurological: She is alert and oriented to person, place, and time.  Skin: Skin is warm and dry.  Psychiatric: She has a normal mood and affect.        Assessment & Plan:   Problem List Items Addressed This Visit      Unprioritized   Vulvar dermatitis - Primary    ? 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 Use 2x/daily with flares, then 2x/wk prn.      Relevant Medications   clobetasol cream (TEMOVATE) 0.05 %      Total face-to-face time with patient: 15 minutes. Over 50% of encounter was spent on counseling and coordination of care. Return in about 6 months (around 11/19/2016), or if symptoms worsen or fail to improve.  Donnamae Jude 05/19/2016 10:27 AM

## 2016-05-26 ENCOUNTER — Encounter: Payer: Self-pay | Admitting: Family Medicine

## 2016-06-08 DIAGNOSIS — L03032 Cellulitis of left toe: Secondary | ICD-10-CM | POA: Diagnosis not present

## 2016-06-08 DIAGNOSIS — L718 Other rosacea: Secondary | ICD-10-CM | POA: Diagnosis not present

## 2016-06-08 DIAGNOSIS — Z79899 Other long term (current) drug therapy: Secondary | ICD-10-CM | POA: Diagnosis not present

## 2016-06-12 DIAGNOSIS — M5432 Sciatica, left side: Secondary | ICD-10-CM | POA: Diagnosis not present

## 2016-06-12 DIAGNOSIS — M2392 Unspecified internal derangement of left knee: Secondary | ICD-10-CM | POA: Diagnosis not present

## 2016-06-12 DIAGNOSIS — M1712 Unilateral primary osteoarthritis, left knee: Secondary | ICD-10-CM | POA: Diagnosis not present

## 2016-06-12 DIAGNOSIS — M25562 Pain in left knee: Secondary | ICD-10-CM | POA: Diagnosis not present

## 2016-07-01 ENCOUNTER — Other Ambulatory Visit: Payer: Self-pay | Admitting: Orthopedic Surgery

## 2016-07-01 DIAGNOSIS — M2392 Unspecified internal derangement of left knee: Secondary | ICD-10-CM

## 2016-07-14 ENCOUNTER — Ambulatory Visit
Admission: RE | Admit: 2016-07-14 | Discharge: 2016-07-14 | Disposition: A | Discharge status: Home / Self Care | Payer: PPO | Source: Ambulatory Visit | Attending: Orthopedic Surgery | Admitting: Orthopedic Surgery

## 2016-07-14 DIAGNOSIS — M84452A Pathological fracture, left femur, initial encounter for fracture: Secondary | ICD-10-CM | POA: Diagnosis not present

## 2016-07-14 DIAGNOSIS — M23252 Derangement of posterior horn of lateral meniscus due to old tear or injury, left knee: Secondary | ICD-10-CM | POA: Insufficient documentation

## 2016-07-14 DIAGNOSIS — M1712 Unilateral primary osteoarthritis, left knee: Secondary | ICD-10-CM | POA: Diagnosis not present

## 2016-07-14 DIAGNOSIS — M2392 Unspecified internal derangement of left knee: Secondary | ICD-10-CM

## 2016-07-14 DIAGNOSIS — M25562 Pain in left knee: Secondary | ICD-10-CM | POA: Insufficient documentation

## 2016-07-14 DIAGNOSIS — M25462 Effusion, left knee: Secondary | ICD-10-CM | POA: Diagnosis not present

## 2016-07-14 DIAGNOSIS — M948X6 Other specified disorders of cartilage, lower leg: Secondary | ICD-10-CM | POA: Diagnosis not present

## 2016-07-24 DIAGNOSIS — M1712 Unilateral primary osteoarthritis, left knee: Secondary | ICD-10-CM | POA: Diagnosis not present

## 2016-07-24 DIAGNOSIS — S83232D Complex tear of medial meniscus, current injury, left knee, subsequent encounter: Secondary | ICD-10-CM | POA: Diagnosis not present

## 2016-08-10 DIAGNOSIS — M84453A Pathological fracture, unspecified femur, initial encounter for fracture: Secondary | ICD-10-CM | POA: Diagnosis not present

## 2016-08-10 DIAGNOSIS — M23204 Derangement of unspecified medial meniscus due to old tear or injury, left knee: Secondary | ICD-10-CM | POA: Diagnosis not present

## 2016-08-10 DIAGNOSIS — M1712 Unilateral primary osteoarthritis, left knee: Secondary | ICD-10-CM | POA: Diagnosis not present

## 2016-08-17 ENCOUNTER — Other Ambulatory Visit: Payer: Self-pay

## 2016-08-17 DIAGNOSIS — Z853 Personal history of malignant neoplasm of breast: Secondary | ICD-10-CM

## 2016-09-09 ENCOUNTER — Ambulatory Visit
Admission: RE | Admit: 2016-09-09 | Discharge: 2016-09-09 | Disposition: A | Discharge status: Home / Self Care | Payer: PPO | Source: Ambulatory Visit | Attending: Surgery | Admitting: Surgery

## 2016-09-09 ENCOUNTER — Encounter
Admission: RE | Admit: 2016-09-09 | Discharge: 2016-09-09 | Disposition: A | Discharge status: Home / Self Care | Payer: PPO | Source: Ambulatory Visit | Attending: Surgery | Admitting: Surgery

## 2016-09-09 DIAGNOSIS — Z01810 Encounter for preprocedural cardiovascular examination: Secondary | ICD-10-CM | POA: Insufficient documentation

## 2016-09-09 DIAGNOSIS — S72412A Displaced unspecified condyle fracture of lower end of left femur, initial encounter for closed fracture: Secondary | ICD-10-CM | POA: Diagnosis not present

## 2016-09-09 DIAGNOSIS — Z01818 Encounter for other preprocedural examination: Secondary | ICD-10-CM | POA: Diagnosis not present

## 2016-09-09 DIAGNOSIS — X58XXXA Exposure to other specified factors, initial encounter: Secondary | ICD-10-CM | POA: Insufficient documentation

## 2016-09-09 DIAGNOSIS — Z01812 Encounter for preprocedural laboratory examination: Secondary | ICD-10-CM | POA: Diagnosis not present

## 2016-09-09 DIAGNOSIS — I10 Essential (primary) hypertension: Secondary | ICD-10-CM

## 2016-09-09 DIAGNOSIS — J449 Chronic obstructive pulmonary disease, unspecified: Secondary | ICD-10-CM | POA: Insufficient documentation

## 2016-09-09 DIAGNOSIS — E119 Type 2 diabetes mellitus without complications: Secondary | ICD-10-CM

## 2016-09-09 HISTORY — DX: Gastro-esophageal reflux disease without esophagitis: K21.9

## 2016-09-09 HISTORY — DX: Chronic kidney disease, unspecified: N18.9

## 2016-09-09 LAB — PROTIME-INR
INR: 1.03
Prothrombin Time: 13.5 seconds (ref 11.4–15.2)

## 2016-09-09 LAB — URINALYSIS, ROUTINE W REFLEX MICROSCOPIC
Bilirubin Urine: NEGATIVE
Glucose, UA: NEGATIVE mg/dL
HGB URINE DIPSTICK: NEGATIVE
Ketones, ur: NEGATIVE mg/dL
Nitrite: POSITIVE — AB
PROTEIN: NEGATIVE mg/dL
Specific Gravity, Urine: 1.009 (ref 1.005–1.030)
Squamous Epithelial / LPF: NONE SEEN
pH: 6 (ref 5.0–8.0)

## 2016-09-09 LAB — SURGICAL PCR SCREEN
MRSA, PCR: NEGATIVE
Staphylococcus aureus: NEGATIVE

## 2016-09-09 LAB — BASIC METABOLIC PANEL
ANION GAP: 10 (ref 5–15)
BUN: 22 mg/dL — ABNORMAL HIGH (ref 6–20)
CALCIUM: 9.7 mg/dL (ref 8.9–10.3)
CO2: 26 mmol/L (ref 22–32)
Chloride: 103 mmol/L (ref 101–111)
Creatinine, Ser: 0.94 mg/dL (ref 0.44–1.00)
GFR calc Af Amer: >60 mL/min (ref >60)
GFR calc non Af Amer: 55 mL/min — ABNORMAL LOW (ref >60)
GLUCOSE: 155 mg/dL — AB (ref 65–99)
Potassium: 3.7 mmol/L (ref 3.5–5.1)
Sodium: 139 mmol/L (ref 135–145)

## 2016-09-09 LAB — CBC
HEMATOCRIT: 42.8 % (ref 35.0–47.0)
Hemoglobin: 14.5 g/dL (ref 12.0–16.0)
MCH: 32 pg (ref 26.0–34.0)
MCHC: 33.9 g/dL (ref 32.0–36.0)
MCV: 94.4 fL (ref 80.0–100.0)
Platelets: 237 10*3/uL (ref 150–440)
RBC: 4.53 MIL/uL (ref 3.80–5.20)
RDW: 13.1 % (ref 11.5–14.5)
WBC: 10.1 10*3/uL (ref 3.6–11.0)

## 2016-09-09 LAB — TYPE AND SCREEN
ABO/RH(D): A POS
Antibody Screen: NEGATIVE

## 2016-09-09 NOTE — Pre-Procedure Instructions (Signed)
Ginger Carne RN compared Today's EKG with one done on 10/03/10, no change, OK to proceed.

## 2016-09-09 NOTE — Patient Instructions (Signed)
  Your procedure is scheduled on: Thursday September 17, 2016. Report to Same Day Surgery. To find out your arrival time please call [phone] between 1PM - 3PM on Wednesday September 16, 2016.  Remember: Instructions that are not followed completely may result in serious medical risk, up to and including death, or upon the discretion of your surgeon and anesthesiologist your surgery may need to be rescheduled.    _x___ 1. Do not eat food or drink liquids after midnight. No gum chewing or hard candies.     _x__ 2. No Alcohol for 24 hours before or after surgery.   ____ 3. Bring all medications with you on the day of surgery if instructed.    __x__ 4. Notify your doctor if there is any change in your medical condition     (cold, fever, infections).    _____ 5. No smoking 24 hours prior to surgery.     Do not wear jewelry, make-up, hairpins, clips or nail polish.  Do not wear lotions, powders, or perfumes.   Do not shave 48 hours prior to surgery. Men may shave face and neck.  Do not bring valuables to the hospital.    [facility] is not responsible for any belongings or valuables.               Contacts, dentures or bridgework may not be worn into surgery.  Leave your suitcase in the car. After surgery it may be brought to your room.  For patients admitted to the hospital, discharge time is determined by your treatment team.   Patients discharged the day of surgery will not be allowed to drive home.    Please read over the following fact sheets that you were given:   [facility] Preparing for Surgery  _x___ Take these medicines the morning of surgery with A SIP OF WATER:    1. losartan (COZAAR)  2. metoprolol succinate (TOPROL-XL)   ____ Fleet Enema (as directed)   __x__ Use CHG Soap as directed on instruction sheet  ____ Use inhalers on the day of surgery and bring to hospital day of surgery  ____ Stop metformin 2 days prior to surgery    ____ Take 1/2 of usual insulin dose  the night before surgery and none on the morning of  surgery.   ____ Stop Coumadin/Plavix/aspirin on does not apply.  _x___ Stop Anti-inflammatories such as Advil, Aleve, Ibuprofen, Motrin, Naproxen, Naprosyn, Goodies powders or aspirin products. OK to take Tylenol.   _x___ Stop supplements:Zinc Sulfate, Turmeric, Krill Oil, Co Q 10, and Vitamin C.    until after surgery.    ____ Bring C-Pap to the hospital.

## 2016-09-10 NOTE — Pre-Procedure Instructions (Signed)
UA FAXED AND LM FOR TIFFANY AT DR POGGI,S

## 2016-09-17 ENCOUNTER — Inpatient Hospital Stay: Payer: PPO | Admitting: Anesthesiology

## 2016-09-17 ENCOUNTER — Inpatient Hospital Stay
Admission: RE | Admit: 2016-09-17 | Discharge: 2016-09-18 | DRG: 470 | Disposition: A | Discharge status: Home Health | Payer: PPO | Source: Ambulatory Visit | Attending: Surgery | Admitting: Surgery

## 2016-09-17 ENCOUNTER — Encounter: Payer: Self-pay | Admitting: Certified Registered Nurse Anesthetist

## 2016-09-17 ENCOUNTER — Encounter
Admission: RE | Disposition: A | Discharge status: Home Health | Payer: Self-pay | Source: Ambulatory Visit | Attending: Surgery

## 2016-09-17 ENCOUNTER — Inpatient Hospital Stay: Payer: PPO

## 2016-09-17 DIAGNOSIS — I129 Hypertensive chronic kidney disease with stage 1 through stage 4 chronic kidney disease, or unspecified chronic kidney disease: Secondary | ICD-10-CM | POA: Diagnosis present

## 2016-09-17 DIAGNOSIS — M23204 Derangement of unspecified medial meniscus due to old tear or injury, left knee: Secondary | ICD-10-CM | POA: Diagnosis present

## 2016-09-17 DIAGNOSIS — Z96652 Presence of left artificial knee joint: Secondary | ICD-10-CM | POA: Diagnosis not present

## 2016-09-17 DIAGNOSIS — N189 Chronic kidney disease, unspecified: Secondary | ICD-10-CM | POA: Diagnosis not present

## 2016-09-17 DIAGNOSIS — M1712 Unilateral primary osteoarthritis, left knee: Principal | ICD-10-CM | POA: Diagnosis present

## 2016-09-17 DIAGNOSIS — Z853 Personal history of malignant neoplasm of breast: Secondary | ICD-10-CM | POA: Diagnosis not present

## 2016-09-17 DIAGNOSIS — I499 Cardiac arrhythmia, unspecified: Secondary | ICD-10-CM | POA: Diagnosis not present

## 2016-09-17 DIAGNOSIS — K219 Gastro-esophageal reflux disease without esophagitis: Secondary | ICD-10-CM | POA: Diagnosis present

## 2016-09-17 DIAGNOSIS — Z881 Allergy status to other antibiotic agents status: Secondary | ICD-10-CM

## 2016-09-17 DIAGNOSIS — Z888 Allergy status to other drugs, medicaments and biological substances status: Secondary | ICD-10-CM | POA: Diagnosis not present

## 2016-09-17 DIAGNOSIS — Z923 Personal history of irradiation: Secondary | ICD-10-CM

## 2016-09-17 DIAGNOSIS — E785 Hyperlipidemia, unspecified: Secondary | ICD-10-CM | POA: Diagnosis not present

## 2016-09-17 DIAGNOSIS — E119 Type 2 diabetes mellitus without complications: Secondary | ICD-10-CM | POA: Diagnosis not present

## 2016-09-17 DIAGNOSIS — E1122 Type 2 diabetes mellitus with diabetic chronic kidney disease: Secondary | ICD-10-CM | POA: Diagnosis not present

## 2016-09-17 DIAGNOSIS — S72415A Nondisplaced unspecified condyle fracture of lower end of left femur, initial encounter for closed fracture: Secondary | ICD-10-CM | POA: Diagnosis not present

## 2016-09-17 HISTORY — PX: PARTIAL KNEE ARTHROPLASTY: SHX2174

## 2016-09-17 LAB — GLUCOSE, CAPILLARY
GLUCOSE-CAPILLARY: 168 mg/dL — AB (ref 65–99)
Glucose-Capillary: 123 mg/dL — ABNORMAL HIGH (ref 65–99)
Glucose-Capillary: 156 mg/dL — ABNORMAL HIGH (ref 65–99)

## 2016-09-17 LAB — ABO/RH: ABO/RH(D): A POS

## 2016-09-17 SURGERY — ARTHROPLASTY, KNEE, UNICOMPARTMENTAL
Anesthesia: General | Site: Knee | Laterality: Left | Wound class: Clean

## 2016-09-17 MED ORDER — CEFAZOLIN SODIUM-DEXTROSE 2-4 GM/100ML-% IV SOLN
2.0000 g | Freq: Once | INTRAVENOUS | Status: AC
  Administered 2016-09-17: 2 g via INTRAVENOUS

## 2016-09-17 MED ORDER — DEXAMETHASONE SODIUM PHOSPHATE 10 MG/ML IJ SOLN
INTRAMUSCULAR | Status: AC
  Filled 2016-09-17: qty 1

## 2016-09-17 MED ORDER — SODIUM CHLORIDE 0.9 % IV SOLN
INTRAVENOUS | Status: DC | PRN
  Administered 2016-09-17: 20 ug/min via INTRAVENOUS

## 2016-09-17 MED ORDER — CALCIUM CITRATE-VITAMIN D 500-500 MG-UNIT PO CHEW
1.0000 | CHEWABLE_TABLET | Freq: Every day | ORAL | Status: DC
  Filled 2016-09-17: qty 1

## 2016-09-17 MED ORDER — ROCURONIUM BROMIDE 100 MG/10ML IV SOLN
INTRAVENOUS | Status: DC | PRN
  Administered 2016-09-17: 50 mg via INTRAVENOUS

## 2016-09-17 MED ORDER — KETOROLAC TROMETHAMINE 30 MG/ML IJ SOLN
INTRAMUSCULAR | Status: DC | PRN
  Administered 2016-09-17: 30 mg via INTRAVENOUS

## 2016-09-17 MED ORDER — NEOMYCIN-POLYMYXIN B GU 40-200000 IR SOLN
Status: AC
  Filled 2016-09-17: qty 20

## 2016-09-17 MED ORDER — ONDANSETRON HCL 4 MG PO TABS: 4.0000 mg | ORAL_TABLET | Freq: Four times a day (QID) | ORAL | Status: DC | PRN

## 2016-09-17 MED ORDER — METOPROLOL SUCCINATE ER 25 MG PO TB24
12.5000 mg | ORAL_TABLET | Freq: Every day | ORAL | Status: DC
  Administered 2016-09-18: 12.5 mg via ORAL
  Filled 2016-09-17: qty 1

## 2016-09-17 MED ORDER — ONDANSETRON HCL 4 MG/2ML IJ SOLN
INTRAMUSCULAR | Status: AC
  Filled 2016-09-17: qty 2

## 2016-09-17 MED ORDER — ROCURONIUM BROMIDE 50 MG/5ML IV SOLN
INTRAVENOUS | Status: AC
  Filled 2016-09-17: qty 1

## 2016-09-17 MED ORDER — HYDROMORPHONE HCL 1 MG/ML IJ SOLN
0.5000 mg | INTRAMUSCULAR | Status: DC | PRN
  Administered 2016-09-17 (×2): 1 mg via INTRAVENOUS
  Filled 2016-09-17 (×2): qty 1

## 2016-09-17 MED ORDER — PRAVASTATIN SODIUM 20 MG PO TABS
40.0000 mg | ORAL_TABLET | Freq: Every day | ORAL | Status: DC
  Administered 2016-09-17: 40 mg via ORAL
  Filled 2016-09-17: qty 2

## 2016-09-17 MED ORDER — CLOBETASOL PROPIONATE 0.05 % EX CREA
1.0000 "application " | TOPICAL_CREAM | Freq: Two times a day (BID) | CUTANEOUS | Status: DC | PRN
  Filled 2016-09-17: qty 15

## 2016-09-17 MED ORDER — ACETAMINOPHEN 10 MG/ML IV SOLN
INTRAVENOUS | Status: DC | PRN
  Administered 2016-09-17: 1000 mg via INTRAVENOUS

## 2016-09-17 MED ORDER — FENTANYL CITRATE (PF) 100 MCG/2ML IJ SOLN
25.0000 ug | INTRAMUSCULAR | Status: DC | PRN
  Administered 2016-09-17 (×4): 25 ug via INTRAVENOUS

## 2016-09-17 MED ORDER — RISAQUAD PO CAPS
1.0000 | ORAL_CAPSULE | Freq: Every day | ORAL | Status: DC
  Administered 2016-09-17: 1 via ORAL
  Filled 2016-09-17 (×2): qty 1

## 2016-09-17 MED ORDER — ZINC SULFATE 66 MG PO TABS: 15.0000 mg | ORAL_TABLET | ORAL | Status: DC | PRN

## 2016-09-17 MED ORDER — OXYCODONE HCL 5 MG PO TABS
5.0000 mg | ORAL_TABLET | ORAL | Status: DC | PRN
  Administered 2016-09-18: 5 mg via ORAL
  Filled 2016-09-17 (×2): qty 1

## 2016-09-17 MED ORDER — LIDOCAINE HCL (PF) 2 % IJ SOLN
INTRAMUSCULAR | Status: AC
  Filled 2016-09-17: qty 2

## 2016-09-17 MED ORDER — OXYCODONE HCL 5 MG/5ML PO SOLN: 5.0000 mg | Freq: Once | ORAL | Status: DC | PRN

## 2016-09-17 MED ORDER — OXYCODONE HCL 5 MG PO TABS: 5.0000 mg | ORAL_TABLET | Freq: Once | ORAL | Status: DC | PRN

## 2016-09-17 MED ORDER — HYDRALAZINE HCL 20 MG/ML IJ SOLN
INTRAMUSCULAR | Status: DC | PRN
  Administered 2016-09-17: 10 mg via INTRAVENOUS

## 2016-09-17 MED ORDER — FENTANYL CITRATE (PF) 100 MCG/2ML IJ SOLN
INTRAMUSCULAR | Status: DC | PRN
  Administered 2016-09-17: 25 ug via INTRAVENOUS
  Administered 2016-09-17 (×3): 50 ug via INTRAVENOUS
  Administered 2016-09-17: 25 ug via INTRAVENOUS
  Administered 2016-09-17: 50 ug via INTRAVENOUS

## 2016-09-17 MED ORDER — ENOXAPARIN SODIUM 40 MG/0.4ML ~~LOC~~ SOLN
40.0000 mg | SUBCUTANEOUS | Status: DC
  Administered 2016-09-18: 40 mg via SUBCUTANEOUS
  Filled 2016-09-17: qty 0.4

## 2016-09-17 MED ORDER — KCL IN DEXTROSE-NACL 20-5-0.9 MEQ/L-%-% IV SOLN
INTRAVENOUS | Status: DC
  Administered 2016-09-17 – 2016-09-18 (×2): via INTRAVENOUS
  Filled 2016-09-17 (×4): qty 1000

## 2016-09-17 MED ORDER — ESTRADIOL 0.1 MG/GM VA CREA
1.0000 | TOPICAL_CREAM | VAGINAL | Status: DC
  Filled 2016-09-17: qty 42.5

## 2016-09-17 MED ORDER — DIPHENHYDRAMINE HCL 12.5 MG/5ML PO ELIX: 12.5000 mg | ORAL_SOLUTION | ORAL | Status: DC | PRN

## 2016-09-17 MED ORDER — BISACODYL 10 MG RE SUPP: 10.0000 mg | Freq: Every day | RECTAL | Status: DC | PRN

## 2016-09-17 MED ORDER — ACETAMINOPHEN 10 MG/ML IV SOLN
INTRAVENOUS | Status: AC
  Filled 2016-09-17: qty 100

## 2016-09-17 MED ORDER — LIDOCAINE HCL (CARDIAC) 20 MG/ML IV SOLN
INTRAVENOUS | Status: DC | PRN
  Administered 2016-09-17: 60 mg via INTRAVENOUS

## 2016-09-17 MED ORDER — DOCUSATE SODIUM 100 MG PO CAPS: 100.0000 mg | ORAL_CAPSULE | Freq: Every day | ORAL | Status: DC

## 2016-09-17 MED ORDER — PROPOFOL 10 MG/ML IV BOLUS
INTRAVENOUS | Status: DC | PRN
  Administered 2016-09-17: 130 mg via INTRAVENOUS

## 2016-09-17 MED ORDER — SUGAMMADEX SODIUM 200 MG/2ML IV SOLN
INTRAVENOUS | Status: DC | PRN
  Administered 2016-09-17: 150 mg via INTRAVENOUS

## 2016-09-17 MED ORDER — METOCLOPRAMIDE HCL 10 MG PO TABS: 5.0000 mg | ORAL_TABLET | Freq: Three times a day (TID) | ORAL | Status: DC | PRN

## 2016-09-17 MED ORDER — DOCUSATE SODIUM 100 MG PO CAPS
100.0000 mg | ORAL_CAPSULE | Freq: Two times a day (BID) | ORAL | Status: DC
  Administered 2016-09-17 – 2016-09-18 (×3): 100 mg via ORAL
  Filled 2016-09-17 (×3): qty 1

## 2016-09-17 MED ORDER — SUGAMMADEX SODIUM 200 MG/2ML IV SOLN
INTRAVENOUS | Status: AC
  Filled 2016-09-17: qty 2

## 2016-09-17 MED ORDER — PHENYLEPHRINE HCL 10 MG/ML IJ SOLN
INTRAMUSCULAR | Status: AC
  Filled 2016-09-17: qty 1

## 2016-09-17 MED ORDER — KRILL OIL 500 MG PO CAPS: 500.0000 mg | ORAL_CAPSULE | ORAL | Status: DC

## 2016-09-17 MED ORDER — ONDANSETRON HCL 4 MG/2ML IJ SOLN
INTRAMUSCULAR | Status: DC | PRN
  Administered 2016-09-17: 4 mg via INTRAVENOUS

## 2016-09-17 MED ORDER — CLOBETASOL PROPIONATE 0.05 % EX CREA: 1.0000 "application " | TOPICAL_CREAM | Freq: Two times a day (BID) | CUTANEOUS | Status: DC | PRN

## 2016-09-17 MED ORDER — FENTANYL CITRATE (PF) 100 MCG/2ML IJ SOLN
INTRAMUSCULAR | Status: AC
  Administered 2016-09-17: 25 ug via INTRAVENOUS
  Filled 2016-09-17: qty 2

## 2016-09-17 MED ORDER — CEFAZOLIN SODIUM-DEXTROSE 2-4 GM/100ML-% IV SOLN
INTRAVENOUS | Status: AC
  Filled 2016-09-17: qty 100

## 2016-09-17 MED ORDER — CEFAZOLIN SODIUM-DEXTROSE 2-4 GM/100ML-% IV SOLN
2.0000 g | Freq: Four times a day (QID) | INTRAVENOUS | Status: AC
  Administered 2016-09-17 – 2016-09-18 (×3): 2 g via INTRAVENOUS
  Filled 2016-09-17 (×3): qty 100

## 2016-09-17 MED ORDER — FAMOTIDINE 20 MG PO TABS
20.0000 mg | ORAL_TABLET | Freq: Once | ORAL | Status: AC
  Administered 2016-09-17: 20 mg via ORAL

## 2016-09-17 MED ORDER — FLEET ENEMA 7-19 GM/118ML RE ENEM: 1.0000 | ENEMA | Freq: Once | RECTAL | Status: DC | PRN

## 2016-09-17 MED ORDER — FENTANYL CITRATE (PF) 100 MCG/2ML IJ SOLN
INTRAMUSCULAR | Status: AC
Start: 2016-09-17 — End: 2016-09-17
  Administered 2016-09-17: 25 ug via INTRAVENOUS
  Filled 2016-09-17: qty 2

## 2016-09-17 MED ORDER — DEXAMETHASONE SODIUM PHOSPHATE 10 MG/ML IJ SOLN
INTRAMUSCULAR | Status: DC | PRN
  Administered 2016-09-17: 10 mg via INTRAVENOUS

## 2016-09-17 MED ORDER — PANTOPRAZOLE SODIUM 40 MG PO TBEC
40.0000 mg | DELAYED_RELEASE_TABLET | Freq: Every day | ORAL | Status: DC
  Administered 2016-09-17 – 2016-09-18 (×2): 40 mg via ORAL
  Filled 2016-09-17 (×2): qty 1

## 2016-09-17 MED ORDER — ONDANSETRON HCL 4 MG/2ML IJ SOLN: 4.0000 mg | Freq: Four times a day (QID) | INTRAMUSCULAR | Status: DC | PRN

## 2016-09-17 MED ORDER — ACETAMINOPHEN 500 MG PO TABS
1000.0000 mg | ORAL_TABLET | Freq: Four times a day (QID) | ORAL | Status: AC
  Administered 2016-09-17 – 2016-09-18 (×4): 1000 mg via ORAL
  Filled 2016-09-17 (×4): qty 2

## 2016-09-17 MED ORDER — ACETAMINOPHEN 650 MG RE SUPP: 650.0000 mg | Freq: Four times a day (QID) | RECTAL | Status: DC | PRN

## 2016-09-17 MED ORDER — TRANEXAMIC ACID 1000 MG/10ML IV SOLN
INTRAVENOUS | Status: AC
  Filled 2016-09-17: qty 10

## 2016-09-17 MED ORDER — HYDROMORPHONE HCL 1 MG/ML IJ SOLN
0.5000 mg | INTRAMUSCULAR | Status: DC | PRN
  Administered 2016-09-17 (×2): 0.5 mg via INTRAVENOUS

## 2016-09-17 MED ORDER — TURMERIC 500 MG PO CAPS: 500.0000 mg | ORAL_CAPSULE | Freq: Every day | ORAL | Status: DC

## 2016-09-17 MED ORDER — TRIAMCINOLONE ACETONIDE 0.1 % EX CREA
1.0000 "application " | TOPICAL_CREAM | Freq: Two times a day (BID) | CUTANEOUS | Status: DC | PRN
  Filled 2016-09-17: qty 15

## 2016-09-17 MED ORDER — HYDROMORPHONE HCL 1 MG/ML IJ SOLN
INTRAMUSCULAR | Status: AC
  Administered 2016-09-17: 0.5 mg via INTRAVENOUS
  Filled 2016-09-17: qty 1

## 2016-09-17 MED ORDER — PROPOFOL 10 MG/ML IV BOLUS
INTRAVENOUS | Status: AC
  Filled 2016-09-17: qty 20

## 2016-09-17 MED ORDER — TRANEXAMIC ACID 1000 MG/10ML IV SOLN
INTRAVENOUS | Status: AC | PRN
  Administered 2016-09-17: 1000 mg via INTRAVENOUS

## 2016-09-17 MED ORDER — METOCLOPRAMIDE HCL 5 MG/ML IJ SOLN: 5.0000 mg | Freq: Three times a day (TID) | INTRAMUSCULAR | Status: DC | PRN

## 2016-09-17 MED ORDER — BUPIVACAINE HCL (PF) 0.5 % IJ SOLN
INTRAMUSCULAR | Status: AC
  Filled 2016-09-17: qty 30

## 2016-09-17 MED ORDER — LOSARTAN POTASSIUM 50 MG PO TABS
100.0000 mg | ORAL_TABLET | Freq: Every day | ORAL | Status: DC
  Administered 2016-09-18: 100 mg via ORAL
  Filled 2016-09-17: qty 2

## 2016-09-17 MED ORDER — ACETAMINOPHEN 325 MG PO TABS: 650.0000 mg | ORAL_TABLET | Freq: Four times a day (QID) | ORAL | Status: DC | PRN

## 2016-09-17 MED ORDER — FENTANYL CITRATE (PF) 250 MCG/5ML IJ SOLN
INTRAMUSCULAR | Status: AC
  Filled 2016-09-17: qty 5

## 2016-09-17 MED ORDER — CO Q-10 200 MG PO CAPS: 200.0000 mg | ORAL_CAPSULE | ORAL | Status: DC

## 2016-09-17 MED ORDER — BUPIVACAINE HCL (PF) 0.5 % IJ SOLN
INTRAMUSCULAR | Status: DC | PRN
  Administered 2016-09-17: 30 mL

## 2016-09-17 MED ORDER — HYDRALAZINE HCL 20 MG/ML IJ SOLN
INTRAMUSCULAR | Status: AC
  Filled 2016-09-17: qty 1

## 2016-09-17 MED ORDER — NEOMYCIN-POLYMYXIN B GU 40-200000 IR SOLN
Status: DC | PRN
  Administered 2016-09-17: 14 mL

## 2016-09-17 MED ORDER — FAMOTIDINE 20 MG PO TABS
ORAL_TABLET | ORAL | Status: AC
  Administered 2016-09-17: 20 mg via ORAL
  Filled 2016-09-17: qty 1

## 2016-09-17 MED ORDER — SODIUM CHLORIDE 0.9 % IV SOLN
INTRAVENOUS | Status: DC
  Administered 2016-09-17 (×2): via INTRAVENOUS

## 2016-09-17 MED ORDER — FENTANYL CITRATE (PF) 100 MCG/2ML IJ SOLN
25.0000 ug | INTRAMUSCULAR | Status: DC | PRN
  Administered 2016-09-17 (×2): 25 ug via INTRAVENOUS

## 2016-09-17 MED ORDER — OCUVITE-LUTEIN PO TABS
1.0000 | ORAL_TABLET | ORAL | Status: DC
  Filled 2016-09-17: qty 1

## 2016-09-17 MED ORDER — MAGNESIUM HYDROXIDE 400 MG/5ML PO SUSP: 30.0000 mL | Freq: Every day | ORAL | Status: DC | PRN

## 2016-09-17 SURGICAL SUPPLY — 57 items
BANDAGE ACE 6X5 VEL STRL LF (GAUZE/BANDAGES/DRESSINGS) ×3 IMPLANT
BONE CEMENT PALACOSE (Cement) ×3 IMPLANT
CANISTER SUCT 1200ML W/VALVE (MISCELLANEOUS) ×3 IMPLANT
CANISTER SUCT 3000ML PPV (MISCELLANEOUS) ×3 IMPLANT
CAPT KNEE PARTIAL 2 ×3 IMPLANT
CATH FOL LEG HOLDER (MISCELLANEOUS) IMPLANT
CATH TRAY METER 16FR LF (MISCELLANEOUS) IMPLANT
CEMENT BONE PALACOSE (Cement) ×1 IMPLANT
CHLORAPREP W/TINT 26ML (MISCELLANEOUS) ×6 IMPLANT
COOLER POLAR GLACIER W/PUMP (MISCELLANEOUS) ×3 IMPLANT
COVER MAYO STAND STRL (DRAPES) ×3 IMPLANT
CUFF TOURN 24 STER (MISCELLANEOUS) IMPLANT
CUFF TOURN 30 STER DUAL PORT (MISCELLANEOUS) ×3 IMPLANT
DRAPE C-ARM XRAY 36X54 (DRAPES) ×3 IMPLANT
DRSG OPSITE POSTOP 4X12 (GAUZE/BANDAGES/DRESSINGS) IMPLANT
DRSG OPSITE POSTOP 4X14 (GAUZE/BANDAGES/DRESSINGS) IMPLANT
DRSG OPSITE POSTOP 4X6 (GAUZE/BANDAGES/DRESSINGS) ×3 IMPLANT
ELECT CAUTERY BLADE 6.4 (BLADE) ×3 IMPLANT
ELECT REM PT RETURN 9FT ADLT (ELECTROSURGICAL) ×3
ELECTRODE REM PT RTRN 9FT ADLT (ELECTROSURGICAL) ×1 IMPLANT
GAUZE PETRO XEROFOAM 1X8 (MISCELLANEOUS) IMPLANT
GAUZE SPONGE 4X4 12PLY STRL (GAUZE/BANDAGES/DRESSINGS) ×3 IMPLANT
GLOVE BIO SURGEON STRL SZ7.5 (GLOVE) ×12 IMPLANT
GLOVE BIO SURGEON STRL SZ8 (GLOVE) ×12 IMPLANT
GLOVE BIOGEL PI IND STRL 8 (GLOVE) ×1 IMPLANT
GLOVE BIOGEL PI INDICATOR 8 (GLOVE) ×2
GLOVE INDICATOR 8.0 STRL GRN (GLOVE) ×3 IMPLANT
GOWN STRL REUS W/ TWL LRG LVL3 (GOWN DISPOSABLE) ×1 IMPLANT
GOWN STRL REUS W/ TWL XL LVL3 (GOWN DISPOSABLE) ×1 IMPLANT
GOWN STRL REUS W/TWL LRG LVL3 (GOWN DISPOSABLE) ×2
GOWN STRL REUS W/TWL XL LVL3 (GOWN DISPOSABLE) ×2
HOOD PEEL AWAY FLYTE STAYCOOL (MISCELLANEOUS) ×9 IMPLANT
KIT RM TURNOVER STRD PROC AR (KITS) ×3 IMPLANT
MAT BLUE FLOOR 46X72 FLO (MISCELLANEOUS) ×3 IMPLANT
NDL SAFETY 18GX1.5 (NEEDLE) ×3 IMPLANT
NEEDLE SPNL 20GX3.5 QUINCKE YW (NEEDLE) ×3 IMPLANT
NS IRRIG 1000ML POUR BTL (IV SOLUTION) ×3 IMPLANT
PACK BLADE SAW RECIP 70 3 PT (BLADE) ×3 IMPLANT
PACK TOTAL KNEE (MISCELLANEOUS) ×3 IMPLANT
PAD WRAPON POLAR KNEE (MISCELLANEOUS) ×1 IMPLANT
PULSAVAC PLUS IRRIG FAN TIP (DISPOSABLE) ×3
SOL .9 NS 3000ML IRR  AL (IV SOLUTION) ×2
SOL .9 NS 3000ML IRR UROMATIC (IV SOLUTION) ×1 IMPLANT
SPONGE XRAY 4X4 16PLY STRL (MISCELLANEOUS) ×3 IMPLANT
STAPLER SKIN PROX 35W (STAPLE) ×3 IMPLANT
STRAP SAFETY BODY (MISCELLANEOUS) ×3 IMPLANT
SUCTION FRAZIER HANDLE 10FR (MISCELLANEOUS) ×2
SUCTION TUBE FRAZIER 10FR DISP (MISCELLANEOUS) ×1 IMPLANT
SUT VIC AB 2-0 CT1 27 (SUTURE) ×8
SUT VIC AB 2-0 CT1 TAPERPNT 27 (SUTURE) ×4 IMPLANT
SYR 20CC LL (SYRINGE) ×3 IMPLANT
SYR 30ML LL (SYRINGE) ×9 IMPLANT
SYRINGE 10CC LL (SYRINGE) ×3 IMPLANT
SYSTEM VACUUM CEMENT MIXING (MISCELLANEOUS) ×3 IMPLANT
TAPE TRANSPORE STRL 2 31045 (GAUZE/BANDAGES/DRESSINGS) ×3 IMPLANT
TIP FAN IRRIG PULSAVAC PLUS (DISPOSABLE) ×1 IMPLANT
WRAPON POLAR PAD KNEE (MISCELLANEOUS) ×3

## 2016-09-17 NOTE — Evaluation (Signed)
Physical Therapy Evaluation Patient Details Name: Paige Medina MRN: [id number] DOB: 1933-09-23 Today's Date: 09/17/2016   History of Present Illness   Pt underwent L partial knee replacement without reported post-op complications. She is POD#0 at time of initial evaluation  Clinical Impression  Pt admitted with above diagnosis. Pt currently with functional limitations due to the deficits listed below (see PT Problem List).  Pt demonstrates excellent bed mobility, transfers, and limited ambulation in room from bed to [facility]. She is steady and stable in standing with hand held assist only. Once upright on commode pt reports feeling lightheaded. Vitals obtained and BP is WNL. Pt requests to return to bed instead of recliner secondary to feeling unwell s/p surgery. Pt returned to bed and RN notified. Pt should be appropriate to return home with HHPT at discharge and family support. No DME needs. Pt will benefit from PT services to address deficits in strength, balance, and mobility in order to return to full function at home.       Follow Up Recommendations Home health PT    Equipment Recommendations  None recommended by PT    Recommendations for Other Services       Precautions / Restrictions Precautions Precautions: Knee Precaution Booklet Issued: Yes (comment) Restrictions Weight Bearing Restrictions: Yes LLE Weight Bearing: Weight bearing as tolerated      Mobility  Bed Mobility Overal bed mobility: Modified Independent             General bed mobility comments: Use of bed rails and HOB elevated. Fair speed and sequencing  Transfers Overall transfer level: Needs assistance Equipment used: Rolling walker (2 wheeled) Transfers: Sit to/from Stand Sit to Stand: Supervision         General transfer comment: Pt demonstrates excellent speed/seuqencing with sit to stand transfers. Rolling walker is present but pt doesn't utilize.   Ambulation/Gait Ambulation/Gait  assistance: Min guard Ambulation Distance (Feet): 3 Feet Assistive device: 1 person hand held assist Gait Pattern/deviations: Decreased step length - right;Decreased step length - left Gait velocity: Decreased Gait velocity interpretation: <1.8 ft/sec, indicative of risk for recurrent falls General Gait Details: Pt takes short shuffling steps to get to [facility]. She is steady and stable in standing with hand held assist only. Once upright on commode pt reports feeling lightheaded. Vitals obtained and BP is WNL. Pt requests to return to bed instead of recliner secondary to feeling unwell s/p surgery  Stairs            Wheelchair Mobility    Modified Rankin (Stroke Patients Only)       Balance Overall balance assessment: Needs assistance Sitting-balance support: No upper extremity supported Sitting balance-Leahy Scale: Good     Standing balance support: Single extremity supported Standing balance-Leahy Scale: Fair Standing balance comment: Pt able to remain standing with single hand held assist.                             Pertinent Vitals/Pain Pain Assessment: 0-10 Pain Score: 5  Pain Location: L knee Pain Descriptors / Indicators: Aching Pain Intervention(s): Monitored during session;Premedicated before session    Home Living Family/patient expects to be discharged to:: Private residence Living Arrangements: Spouse/significant other Available Help at Discharge: Family Type of Home: Other(Comment) (Condo) Home Access: Level entry     Home Layout: One level Home Equipment: Environmental consultant - 2 wheels;Walker - 4 wheels;Cane - single point;Shower seat - built in (no grab bars, no  BSC)      Prior Function Level of Independence: Independent with assistive device(s)         Comments: Independent with ambulation with rollator. No falls. Independent with ADLs/IADLs     Hand Dominance   Dominant Hand: Right    Extremity/Trunk Assessment   Upper Extremity  Assessment Upper Extremity Assessment: Overall WFL for tasks assessed    Lower Extremity Assessment Lower Extremity Assessment: LLE deficits/detail LLE Deficits / Details: Pt reports fully intact sensation in LLE to light touch. Full DF/PF. Able to perform SLR and SAQ without assistance       Communication   Communication: No difficulties  Cognition Arousal/Alertness: Awake/alert Behavior During Therapy: WFL for tasks assessed/performed Overall Cognitive Status: Within Functional Limits for tasks assessed                                        General Comments      Exercises Total Joint Exercises Ankle Circles/Pumps: AROM;Both;10 reps;Supine Quad Sets: Strengthening;Both;10 reps;Supine Gluteal Sets: Strengthening;Both;10 reps;Supine Towel Squeeze: Strengthening;Both;10 reps;Supine Short Arc Quad: Strengthening;Left;10 reps;Supine Heel Slides: Strengthening;Left;10 reps;Supine Hip ABduction/ADduction: Strengthening;Left;10 reps;Supine Straight Leg Raises: Strengthening;Left;10 reps;Supine Goniometric ROM: -2 to 91 degrees AAROM, pain limited   Assessment/Plan    PT Assessment Patient needs continued PT services  PT Problem List Decreased strength;Decreased range of motion;Decreased balance;Decreased activity tolerance;Decreased mobility;Pain       PT Treatment Interventions DME instruction;Gait training;Stair training;Functional mobility training;Therapeutic activities;Therapeutic exercise;Balance training;Neuromuscular re-education;Patient/family education;Manual techniques    PT Goals (Current goals can be found in the Care Plan section)  Acute Rehab PT Goals Patient Stated Goal: Return to prior level of function at home PT Goal Formulation: With patient Time For Goal Achievement: 10/01/16 Potential to Achieve Goals: Good    Frequency BID   Barriers to discharge        Co-evaluation               AM-PAC PT "6 Clicks" Daily Activity   Outcome Measure Difficulty turning over in bed (including adjusting bedclothes, sheets and blankets)?: A Little Difficulty moving from lying on back to sitting on the side of the bed? : A Little Difficulty sitting down on and standing up from a chair with arms (e.g., wheelchair, bedside commode, etc,.)?: A Little Help needed moving to and from a bed to chair (including a wheelchair)?: A Little Help needed walking in hospital room?: A Little Help needed climbing 3-5 steps with a railing? : A Little 6 Click Score: 18    End of Session Equipment Utilized During Treatment: Gait belt Activity Tolerance: Other (comment) (Pt feels "unwell" and mildly lightheaded with ambulation) Patient left: in bed;with call bell/phone within reach;with bed alarm set;with SCD's reapplied;Other (comment) (towel roll under heel and polar care in place) Nurse Communication: Mobility status PT Visit Diagnosis: Other abnormalities of gait and mobility (R26.89);Muscle weakness (generalized) (M62.81);Pain Pain - Right/Left: Left Pain - part of body: Knee    Time: 7062-3762 PT Time Calculation (min) (ACUTE ONLY): 24 min   Charges:   PT Evaluation $PT Eval Low Complexity: 1 Procedure PT Treatments $Therapeutic Exercise: 8-22 mins   PT G Codes:   PT G-Codes **NOT FOR INPATIENT CLASS** Functional Assessment Tool Used: AM-PAC 6 Clicks Basic Mobility Functional Limitation: Mobility: Walking and moving around Mobility: Walking and Moving Around Current Status (G3151): At least 40 percent but less than 60 percent impaired, limited  or restricted Mobility: Walking and Moving Around Goal Status [phone]): At least 1 percent but less than 20 percent impaired, limited or restricted    Phillips Grout PT, DPT   Arabel Barcenas 09/17/2016, 4:54 PM

## 2016-09-17 NOTE — Anesthesia Post-op Follow-up Note (Cosign Needed)
Anesthesia QCDR form completed.        

## 2016-09-17 NOTE — Anesthesia Postprocedure Evaluation (Signed)
Anesthesia Post Note  Patient: Paige Medina  Procedure(s) Performed: Procedure(s) (LRB): UNICOMPARTMENTAL KNEE (Left)  Patient location during evaluation: PACU Anesthesia Type: General Level of consciousness: awake and alert Pain management: pain level controlled Vital Signs Assessment: post-procedure vital signs reviewed and stable Respiratory status: spontaneous breathing, nonlabored ventilation, respiratory function stable and patient connected to nasal cannula oxygen Cardiovascular status: blood pressure returned to baseline and stable Postop Assessment: no signs of nausea or vomiting Anesthetic complications: no     Last Vitals:  Vitals:   09/17/16 1427 09/17/16 1450  BP: (!) 141/57 (!) 131/51  Pulse: 72 67  Resp: 11 16  Temp:  36.7 C    Last Pain:  Vitals:   09/17/16 1450  TempSrc: Oral  PainSc: 3                  [name]

## 2016-09-17 NOTE — Op Note (Signed)
09/17/2016  12:59 PM  Patient:   Paige Medina  Pre-Op Diagnosis:   Osteoarthritis of medial compartment, left knee.  Post-Op Diagnosis:   Same  Procedure:   Left unicondylar knee arthroplasty.  Surgeon:   Pascal Lux, MD  Assistant:   Cameron Proud, PA-C  Anesthesia:   GET  Findings:   As above.  Complications:   None  EBL:   25 cc  Fluids:   750 cc crystalloid  UOP:   None  TT:   90 minutes at 300 mmHg  Drains:   None  Closure:   Staples  Implants:   All-cemented Biomet Oxford system with a small femoral component, an "AA" sized tibial tray, and a 4 mm meniscal bearing insert.  Brief Clinical Note:   The patient is a 83 year old female with a history of progressively worsening medial sided left knee pain. Her symptoms have progressed despite medications, activity modification, etc. Her history and examination were consistent with moderate degenerative joint disease with a degenerative frayed torn medial meniscus, all of which were confirmed by MRI scan. The patient presents at this time for a left partial knee replacement.  Procedure:   The patient was brought into the operating room and lain in the supine position. After adequate general endotracheal intubation and anesthesia were obtained, the patient was repositioned so that the non-surgical leg was placed in a flexed and abducted position in the yellow fin leg holder while the surgical extremity was placed over the Biomet leg holder. The left lower extremity was prepped with ChloraPrep solution before being draped sterilely. Preoperative antibiotics were administered. After performing a timeout to verify the appropriate surgical site, the limb was exsanguinated with an Esmarch and the tourniquet inflated to 300 mmHg. A standard anterior approach to the knee was made through an approximately 3.5-4 inch incision. The incision was carried down through the subcutaneous tissues to expose the superficial retinaculum. This  was split the length the incision and medial flap elevated sufficiently to expose the medial retinaculum. This was incised along the medial border of the patella tendon and extended proximally along the medial border of the patella, leaving a 3-4 mm cuff of tissue. The soft tissues were elevated off the anteromedial aspect of the proximal tibia. The anterior portion of the meniscus was removed after performing a subtotal excision of the infrapatellar fat pad. The anterior cruciate ligament was inspected and found to be in excellent condition. Osteophytes were removed from the inferior pole of the patella as well as from the notch using a quarter-inch osteotome. There were significant degenerative changes of both the femur and tibia on the medial side. The medial femoral condyle was sized using the small and medium sizers. It was felt that the small guide best optimized the contour of the femur. This was left in place and the external tibial guide positioned. The coupling device was used to connect the guide to the medial femoral condylar sizer to optimize appropriate orientation. Two guide pins were inserted into the cutting block before the coupling device and sizer were removed. The appropriate tibial cut was made using the oscillating and reciprocating saws. The piece was removed in its entirety and taken to the back table where it was sized and found to be optimally replicated by an "AA" sized component. The 8 mm spacer was inserted to verify that sufficient bone had been removed.  Attention was directed to femoral side. The intramedullary canal was accessed through a 4 mm drill hole.  The intramedullary guide was positioned before the guide for the femoral condylar holes was positioned. The appropriate coupling device connected this guide to the intramedullary guide before both drill holes were created in the distal aspect of the medial femoral condyle. The devices were removed and the posterior condylar  cutting block inserted. The appropriate cut was made using the reciprocating saw and this piece removed. The #0 spigot was inserted and the initial bone milling performed. A trial femoral component was inserted and both the flexion and extension gaps measured. In flexion, the gap measured 8 mm whereas in extension, it measured 2 mm. Therefore, the #6 spigot was selected and the secondary bone milling performed. Repeat sizing demonstrated symmetric flexion and extension gaps. The bone was removed from the postero-medial and postero-lateral aspects of the femoral condyle, as well as from the beneath the collar of the spigot. Bone also was removed from the anterior portion of the femur so as to minimize any potential impingement with the meniscal bearing insert. The trial components removed and several drill holes placed into the distal femoral condyle to further augment cement fixation.  Attention was redirected to the tibial side. The "AA" sized tibial tray was positioned and temporarily secured using the appropriate spiked nail. The keel was created using the bi-bladed reciprocating saw and hoe. The keeled "AA" sized trial tibial tray was inserted to be sure that it seated properly. At this point, a total of 30 cc of 0.5% Sensorcaine was injected in and around the posterior and medial capsular tissues to help with postoperative pain control.  The bony surfaces were prepared for cementing by irrigating them thoroughly with bacitracin saline solution using the jet lavage system before packing them with a dry Ray-Tec sponge. Meanwhile, cement was being mixed on the back table. When the cement was ready, the tibial tray was cemented in first. The excess cement was removed using a Surveyor, quantity after impacting it into place. Next, the femoral component was impacted into place. Again the excess cement was removed using a Surveyor, quantity. The 5 mm spacer was inserted and the knee brought into near full extension  while the cement hardened. Once the cement hardened, the spacer was removed and the 4 mm meniscal bearing insert was trialed. This demonstrated excellent tracking while the knee was placed through a range of motion, and showed no evidence towards subluxation or dislocation. In addition, it did not fit too tightly. Therefore, the permanent 4 mm meniscal bearing insert was snapped into position after verifying that no cement had been retained posteriorly. Again the knee was placed through a range of motion with the findings as described above.  The wound was copiously irrigated with bacitracin saline solution via the jet lavage system before the retinacular layer was reapproximated using #0 Vicryl interrupted sutures. At this point, 1 g of transexemic acid in 10 cc of normal saline was injected intra-articularly. The subcutaneous tissues were closed in two layers using 2-0 Vicryl interrupted sutures before the skin was closed using staples. A sterile occlusive dressing was applied to the knee before the patient was awakened. The patient was transferred back to his/her hospital bed and returned to the recovery room in satisfactory condition after tolerating the procedure well. A Polar Care device was applied to the knee as well.
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
## 2016-09-17 NOTE — NC FL2 (Signed)
[place] LEVEL OF CARE SCREENING TOOL     IDENTIFICATION  Patient Name: Paige Medina Birthdate: Apr 18, 1933 Sex: female Admission Date (Current Location): 09/17/2016  [place] and [place] Number:  Engineering geologist and Address:  [facility], [address], [place], [place] [address]      Provider Number: [id number]  Attending Physician Name and Address:  Corky Mull, MD  Relative Name and Phone Number:       Current Level of Care: Hospital Recommended Level of Care: [place] Prior Approval Number:    Date Approved/Denied:   PASRR Number:  [id number])  Discharge Plan: SNF    Current Diagnoses: Patient Active Problem List   Diagnosis Date Noted  . Status post unicompartmental knee replacement, left 09/17/2016  . Health maintenance examination 10/04/2015  . Vulvar dermatitis 12/24/2014  . Advanced care planning/counseling discussion 09/19/2013  . Medicare annual wellness visit, subsequent 09/14/2013  . CKD stage 3 due to type 2 diabetes mellitus ([place]) 06/13/2013  . Arthritis   . Diabetes mellitus without complication ([place])   . Hypertension   . Hyperlipidemia   . Rosacea   . History of breast cancer     Orientation RESPIRATION BLADDER Height & Weight     Self, Time, Situation, Place  O2 (2 Liters Oxygen ) Continent Weight: 154 lb (69.9 kg) Height:  5\' 5"  (165.1 cm)  BEHAVIORAL SYMPTOMS/MOOD NEUROLOGICAL BOWEL NUTRITION STATUS   (none)  (none) Continent Diet (Diet: Clear Liquid )  AMBULATORY STATUS COMMUNICATION OF NEEDS Skin   Extensive Assist Verbally Surgical wounds (Incision: Left Knee )                       Personal Care Assistance Level of Assistance  Bathing, Feeding, Dressing Bathing Assistance: Limited assistance Feeding assistance: Independent Dressing Assistance: Limited assistance     Functional Limitations Info  Sight, Hearing, Speech Sight Info: Adequate Hearing Info:  Adequate Speech Info: Adequate    SPECIAL CARE FACTORS FREQUENCY  PT (By licensed PT), OT (By licensed OT)     PT Frequency:  (5) OT Frequency:  (5)            Contractures      Additional Factors Info  Code Status, Allergies Code Status Info:  (Full Code. ) Allergies Info:  (Ciprofloxacin, Epinephrine, Lipitor Atorvastatin, Tape, Tegaderm, Ag Mesh Silver)           Current Medications (09/17/2016):  This is the current hospital active medication list Current Facility-Administered Medications  Medication Dose Route Frequency Provider Last Rate Last Dose  . acetaminophen (TYLENOL) tablet 650 mg  650 mg Oral Q6H PRN Poggi, Marshall Cork, MD       Or  . acetaminophen (TYLENOL) suppository 650 mg  650 mg Rectal Q6H PRN Poggi, Marshall Cork, MD      . acetaminophen (TYLENOL) tablet 1,000 mg  1,000 mg Oral Q6H Poggi, Marshall Cork, MD      . acidophilus (RISAQUAD) capsule 1 capsule  1 capsule Oral QHS Poggi, Marshall Cork, MD      . beta carotene w/minerals (OCUVITE) tablet 1 tablet  1 tablet Oral QODAY Poggi, Marshall Cork, MD      . bisacodyl (DULCOLAX) suppository 10 mg  10 mg Rectal Daily PRN Poggi, Marshall Cork, MD      . Derrill Memo ON 09/18/2016] calcium citrate-vitamin D 500-500 MG-UNIT per chewable tablet 1 tablet  1 tablet Oral Q breakfast Poggi, Marshall Cork,  MD      . ceFAZolin (ANCEF) IVPB 2g/100 mL premix  2 g Intravenous Q6H Poggi, Marshall Cork, MD      . clobetasol cream (TEMOVATE) 1.28 % 1 application  1 application Topical BID PRN Poggi, Marshall Cork, MD      . dextrose 5 % and 0.9 % NaCl with KCl 20 mEq/L infusion   Intravenous Continuous Poggi, Marshall Cork, MD 75 mL/hr at 09/17/16 1558    . diphenhydrAMINE (BENADRYL) 12.5 MG/5ML elixir 12.5-25 mg  12.5-25 mg Oral Q4H PRN Poggi, Marshall Cork, MD      . docusate sodium (COLACE) capsule 100 mg  100 mg Oral BID Poggi, Marshall Cork, MD      . Derrill Memo ON 09/18/2016] enoxaparin (LOVENOX) injection 40 mg  40 mg Subcutaneous Q24H Poggi, Marshall Cork, MD      . Derrill Memo ON 09/20/2016] estradiol (ESTRACE) vaginal  cream 1 Applicatorful  1 Applicatorful Vaginal Weekly Poggi, Marshall Cork, MD      . HYDROmorphone (DILAUDID) injection 0.5-1 mg  0.5-1 mg Intravenous Q2H PRN Poggi, Marshall Cork, MD      . Derrill Memo ON 09/18/2016] losartan (COZAAR) tablet 100 mg  100 mg Oral Daily Poggi, Marshall Cork, MD      . magnesium hydroxide (MILK OF MAGNESIA) suspension 30 mL  30 mL Oral Daily PRN Poggi, Marshall Cork, MD      . metoCLOPramide (REGLAN) tablet 5-10 mg  5-10 mg Oral Q8H PRN Poggi, Marshall Cork, MD       Or  . metoCLOPramide (REGLAN) injection 5-10 mg  5-10 mg Intravenous Q8H PRN Poggi, Marshall Cork, MD      . Derrill Memo ON 09/18/2016] metoprolol succinate (TOPROL-XL) 24 hr tablet 12.5 mg  12.5 mg Oral Daily Poggi, Marshall Cork, MD      . ondansetron (ZOFRAN) tablet 4 mg  4 mg Oral Q6H PRN Poggi, Marshall Cork, MD       Or  . ondansetron (ZOFRAN) injection 4 mg  4 mg Intravenous Q6H PRN Poggi, Marshall Cork, MD      . oxyCODONE (Oxy IR/ROXICODONE) immediate release tablet 5-10 mg  5-10 mg Oral Q3H PRN Poggi, Marshall Cork, MD      . pantoprazole (PROTONIX) EC tablet 40 mg  40 mg Oral Daily Poggi, Marshall Cork, MD      . pravastatin (PRAVACHOL) tablet 40 mg  40 mg Oral q1800 Poggi, Marshall Cork, MD      . sodium phosphate (FLEET) 7-19 GM/118ML enema 1 enema  1 enema Rectal Once PRN Poggi, Marshall Cork, MD      . triamcinolone cream (KENALOG) 0.1 % 1 application  1 application Topical BID PRN Poggi, Marshall Cork, MD         Discharge Medications: Please see discharge summary for a list of discharge medications.  Relevant Imaging Results:  Relevant Lab Results:   Additional Information  (SSN: [id number])  Toshi Ishii, Veronia Beets, LCSW

## 2016-09-17 NOTE — Progress Notes (Addendum)
PHARMACIST - PHYSICIAN ORDER COMMUNICATION  CONCERNING: P&T Medication Policy on Herbal Medications  DESCRIPTION:  This patient's order for:  Krill, CoQ10, tumeric, zinc  has been noted.  This product(s) is classified as an "herbal" or natural product. Due to a lack of definitive safety studies or FDA approval, nonstandard manufacturing practices, plus the potential risk of unknown drug-drug interactions while on inpatient medications, the Pharmacy and Therapeutics Committee does not permit the use of "herbal" or natural products of this type within [facility].   ACTION TAKEN: The pharmacy department is unable to verify this order at this time and your patient has been informed of this safety policy. Please reevaluate patient's clinical condition at discharge and address if the herbal or natural product(s) should be resumed at that time.

## 2016-09-17 NOTE — Transfer of Care (Signed)
Immediate Anesthesia Transfer of Care Note  Patient: Paige Medina  Procedure(s) Performed: Procedure(s): UNICOMPARTMENTAL KNEE (Left)  Patient Location: PACU  Anesthesia Type:General  Level of Consciousness: awake and alert   Airway & Oxygen Therapy: Patient Spontanous Breathing and Patient connected to face mask oxygen  Post-op Assessment: Report given to RN and Post -op Vital signs reviewed and stable  Post vital signs: Reviewed and stable  Last Vitals:  Vitals:   09/17/16 0928 09/17/16 1312  BP: (!) 127/54 (!) 119/52  Pulse: 60 71  Resp: 16 15  Temp: (!) 36.2 C 36.7 C    Last Pain:  Vitals:   09/17/16 0928  TempSrc: Tympanic  PainSc: 0-No pain         Complications: No apparent anesthesia complications

## 2016-09-17 NOTE — Discharge Summary (Signed)
Physician Discharge Summary  Patient ID: Paige Medina MRN: [id number] DOB/AGE: 04-29-1933 83 y.o.  Admit date: 09/17/2016 Discharge date: 09/18/16  Admission Diagnoses:  closed subchondral insufficiency fracture of condyle of femur Osteoarthritis of the medial compartment of the left knee.  Discharge Diagnoses: Patient Active Problem List   Diagnosis Date Noted  . Status post unicompartmental knee replacement, left 09/17/2016  . Health maintenance examination 10/04/2015  . Vulvar dermatitis 12/24/2014  . Advanced care planning/counseling discussion 09/19/2013  . Medicare annual wellness visit, subsequent 09/14/2013  . CKD stage 3 due to type 2 diabetes mellitus ([place]) 06/13/2013  . Arthritis   . Diabetes mellitus without complication ([place])   . Hypertension   . Hyperlipidemia   . Rosacea   . History of breast cancer   Left knee medial compartment osteoarthritis  Past Medical History:  Diagnosis Date  . Arthritis 2004  . Benign colonic polyp   . Breast cancer ([place]) 2012   left breast, radiation  . Chronic kidney disease   . Diabetes mellitus without complication ([place]) 6578   diet controlled, DMSE 05/2014  . GERD (gastroesophageal reflux disease)   . Heart murmur 1985  . Hyperlipidemia 2012  . Hypertension 1992  . Malignant neoplasm of upper-inner quadrant of female breast ([place]) 2012   left breast, T1 N0 ER/PR positive HER 2 negative  . Rosacea    Transfusion: None.   Consultants (if any):   Discharged Condition: Improved  Hospital Course: Paige Medina is an 83 y.o. female who was admitted 09/17/2016 with a diagnosis of medial compartment osteoarthritis of the left knee and went to the operating room on 09/17/2016 and underwent the above named procedures.    Surgeries: Procedure(s): UNICOMPARTMENTAL KNEE on 09/17/2016 Patient tolerated the surgery well. Taken to PACU where she was stabilized and then transferred to the orthopedic floor.  Started on Lovenox  40mg  q 24 hrs. Foot pumps applied bilaterally at 80 mm. Heels elevated on bed with rolled towels. No evidence of DVT. Negative [place]. Physical therapy started on day #1 for gait training and transfer. OT started day #1 for ADL and assisted devices.  Patient's IV was d/c on POD1  Implants: All-cemented Bi[employer] [place] system with a small femoral component, an "AA" sized tibial tray, and a 4 mm meniscal bearing insert.  She was given perioperative antibiotics:  Anti-infectives    Start     Dose/Rate Route Frequency Ordered Stop   09/17/16 1700  ceFAZolin (ANCEF) IVPB 2g/100 mL premix     2 g 200 mL/hr over 30 Minutes Intravenous Every 6 hours 09/17/16 1448 09/18/16 0555   09/17/16 0918  ceFAZolin (ANCEF) 2-4 GM/100ML-% IVPB    Comments:  Otho Darner   : cabinet override      09/17/16 0918 09/17/16 1047   09/17/16 0030  ceFAZolin (ANCEF) IVPB 2g/100 mL premix     2 g 200 mL/hr over 30 Minutes Intravenous  Once 09/17/16 0028 09/17/16 1102    .  She was given sequential compression devices, early ambulation, and Lovenox for DVT prophylaxis.  She benefited maximally from the hospital stay and there were no complications.    Recent vital signs:  Vitals:   09/17/16 2004 09/17/16 2341  BP: (!) 132/52 (!) 133/46  Pulse: 76 66  Resp: 18   Temp: 98.4 F (36.9 C) 98 F (36.7 C)    Recent laboratory studies:  Lab Results  Component Value Date   HGB 12.7 09/18/2016   HGB 14.5 09/09/2016  HGB 14.6 03/17/2016   Lab Results  Component Value Date   WBC 13.7 (H) 09/18/2016   PLT 222 09/18/2016   Lab Results  Component Value Date   INR 1.03 09/09/2016   Lab Results  Component Value Date   NA 136 09/18/2016   K 4.6 09/18/2016   CL 109 09/18/2016   CO2 22 09/18/2016   BUN 24 (H) 09/18/2016   CREATININE 1.32 (H) 09/18/2016   GLUCOSE 149 (H) 09/18/2016    Discharge Medications:   Allergies as of 09/18/2016      Reactions   Ciprofloxacin Nausea Only   Epinephrine Other (See  Comments)   Blisters, confirmed(2012) by Dr. Adolph Pollack since pt last visit couldn't remember   Lipitor [atorvastatin] Other (See Comments)   myalgias   Tape Other (See Comments)   Blisters--(Paper or cloth tape okay)   Tegaderm Ag Mesh [silver] Rash      Medication List    TAKE these medications   beta carotene w/minerals tablet Take 1 tablet by mouth every other day.   CITRACAL +D3 PO Take 1 tablet by mouth daily with breakfast.   clobetasol 0.05 % external solution Commonly known as:  TEMOVATE Apply 1 application topically 2 (two) times daily as needed (for rash on scalp). What changed:  Another medication with the same name was changed. Make sure you understand how and when to take each.   clobetasol cream 0.05 % Commonly known as:  TEMOVATE Apply 1 application topically 2 (two) times daily. What changed:  when to take this  reasons to take this   Co Q-10 200 MG Caps Take 200 mg by mouth every morning.   docusate sodium 100 MG capsule Commonly known as:  COLACE Take 100 mg by mouth at bedtime.   doxycycline 50 MG tablet Commonly known as:  ADOXA Take 50 mg by mouth daily as needed.   enoxaparin 40 MG/0.4ML injection Commonly known as:  LOVENOX Inject 0.4 mLs (40 mg total) into the skin daily.   estradiol 0.1 MG/GM vaginal cream Commonly known as:  ESTRACE Place 1 Applicatorful vaginally 2 (two) times a week. What changed:  when to take this   Krill Oil 500 MG Caps Take 500 mg by mouth every morning.   losartan 100 MG tablet Commonly known as:  COZAAR Take 1 tablet (100 mg total) by mouth daily. PLEASE DO NOT FILL UNTIL PT CALLS. What changed:  when to take this  additional instructions   lovastatin 40 MG tablet Commonly known as:  MEVACOR TAKE ONE TABLET BY MOUTH EVERY NIGHT AT BEDTIME   metoprolol succinate 25 MG 24 hr tablet Commonly known as:  TOPROL-XL TAKE 1/2 TABLET BY MOUTH EVERY DAY. WITHA MEAL. What changed:  See the new  instructions.   metroNIDAZOLE 0.75 % cream Commonly known as:  METROCREAM Apply 1 application topically at bedtime as needed. For rosacea.   oxyCODONE 5 MG immediate release tablet Commonly known as:  Oxy IR/ROXICODONE Take 1-2 tablets (5-10 mg total) by mouth every 4 (four) hours as needed for breakthrough pain.   PROBIOTIC DAILY PO Take 1 capsule by mouth at bedtime.   triamcinolone cream 0.1 % Commonly known as:  KENALOG Apply 1 application topically 2 (two) times daily.   Turmeric 500 MG Caps Take 500 mg by mouth at bedtime.   TYLENOL 8 HOUR ARTHRITIS PAIN 650 MG CR tablet Generic drug:  acetaminophen Take 1,300 mg by mouth every 8 (eight) hours as needed for pain.  VITAMIN C PO Take 1 tablet by mouth daily as needed (for cold symptoms).   ZINC 15 PO Take 15 mg by mouth as needed (with cold symptoms). Reported on 08/16/2015            Durable Medical Equipment        Start     Ordered   09/17/16 1449  DME Walker rolling  Once    Question:  Patient needs a walker to treat with the following condition  Answer:  Status post unicompartmental knee replacement, left   09/17/16 1448   09/17/16 1449  DME 3 n 1  Once     09/17/16 1448   09/17/16 1449  DME Bedside commode  Once    Question:  Patient needs a bedside commode to treat with the following condition  Answer:  Status post unicompartmental knee replacement, left   09/17/16 1448      Diagnostic Studies: Dg Chest 2 View  Result Date: 09/09/2016 CLINICAL DATA:  Preop partial left knee replacement EXAM: CHEST  2 VIEW COMPARISON:  None. FINDINGS: There is hyperinflation of the lungs compatible with COPD. Heart and mediastinal contours are within normal limits. No focal opacities or effusions. No acute bony abnormality. Degenerative spurring throughout the thoracic spine. Degenerative changes in the shoulders. IMPRESSION: COPD.  No active disease. Electronically Signed   By: Rolm Baptise M.D.   On: 09/09/2016 15:01    Dg Knee Left Port  Result Date: 09/17/2016 CLINICAL DATA:  Status post unicompartmental lead knee replacement. EXAM: PORTABLE LEFT KNEE - 1-2 VIEW COMPARISON:  MRI 07/14/2016. FINDINGS: Interim replacement of the medial joint left knee. Hardware intact. Anatomic alignment. No acute bony abnormality . IMPRESSION: 1.  Postsurgical changes left knee.  Anatomic alignment. 2. Peripheral vascular disease . Electronically Signed   By: Marcello Moores  Register   On: 09/17/2016 14:19    Disposition: Plan will be for discharge home with HHPT on 09/18/16.  Follow-up Information    Lattie Corns, PA-C Follow up in 14 day(s).   Specialty:  Physician Assistant Why:  Electa Sniff information: [place] [id number] [phone]         Signed: Judson Roch PA-C 09/18/2016, 7:27 AM

## 2016-09-17 NOTE — Discharge Instructions (Signed)

## 2016-09-17 NOTE — Anesthesia Procedure Notes (Signed)
Procedure Name: Intubation Date/Time: 09/17/2016 10:58 AM Performed by: Eben Burow Pre-anesthesia Checklist: Patient identified, Emergency Drugs available, Suction available, Patient being monitored and Timeout performed Patient Re-evaluated:Patient Re-evaluated prior to induction Oxygen Delivery Method: Circle system utilized Preoxygenation: Pre-oxygenation with 100% oxygen Induction Type: IV induction Ventilation: Mask ventilation without difficulty Laryngoscope Size: Miller and 2 Grade View: Grade I Tube type: Oral Tube size: 7.0 mm Airway Equipment and Method: Stylet Placement Confirmation: ETT inserted through vocal cords under direct vision,  positive ETCO2 and breath sounds checked- equal and bilateral Secured at: 23 cm Tube secured with: Tape Dental Injury: Teeth and Oropharynx as per pre-operative assessment

## 2016-09-17 NOTE — H&P (Signed)
Paper H&P to be scanned into permanent record. H&P reviewed and patient re-examined. No changes. 

## 2016-09-18 ENCOUNTER — Encounter: Payer: Self-pay | Admitting: Surgery

## 2016-09-18 LAB — CBC WITH DIFFERENTIAL/PLATELET
Basophils Absolute: 0 10*3/uL (ref 0–0.1)
Basophils Relative: 0 %
EOS ABS: 0 10*3/uL (ref 0–0.7)
EOS PCT: 0 %
HCT: 37.7 % (ref 35.0–47.0)
Hemoglobin: 12.7 g/dL (ref 12.0–16.0)
LYMPHS ABS: 1.8 10*3/uL (ref 1.0–3.6)
LYMPHS PCT: 13 %
MCH: 32.4 pg (ref 26.0–34.0)
MCHC: 33.7 g/dL (ref 32.0–36.0)
MCV: 96.1 fL (ref 80.0–100.0)
MONO ABS: 1.1 10*3/uL — AB (ref 0.2–0.9)
MONOS PCT: 8 %
Neutro Abs: 10.8 10*3/uL — ABNORMAL HIGH (ref 1.4–6.5)
Neutrophils Relative %: 79 %
PLATELETS: 222 10*3/uL (ref 150–440)
RBC: 3.92 MIL/uL (ref 3.80–5.20)
RDW: 13.4 % (ref 11.5–14.5)
WBC: 13.7 10*3/uL — ABNORMAL HIGH (ref 3.6–11.0)

## 2016-09-18 LAB — BASIC METABOLIC PANEL
Anion gap: 5 (ref 5–15)
BUN: 24 mg/dL — AB (ref 6–20)
CO2: 22 mmol/L (ref 22–32)
CREATININE: 1.32 mg/dL — AB (ref 0.44–1.00)
Calcium: 8.7 mg/dL — ABNORMAL LOW (ref 8.9–10.3)
Chloride: 109 mmol/L (ref 101–111)
GFR calc Af Amer: 43 mL/min — ABNORMAL LOW (ref >60)
GFR, EST NON AFRICAN AMERICAN: 37 mL/min — AB (ref >60)
GLUCOSE: 149 mg/dL — AB (ref 65–99)
POTASSIUM: 4.6 mmol/L (ref 3.5–5.1)
SODIUM: 136 mmol/L (ref 135–145)

## 2016-09-18 MED ORDER — CALCIUM CARBONATE-VITAMIN D 500-200 MG-UNIT PO TABS
1.0000 | ORAL_TABLET | Freq: Every day | ORAL | Status: DC
  Administered 2016-09-18: 1 via ORAL
  Filled 2016-09-18: qty 1

## 2016-09-18 MED ORDER — ENOXAPARIN SODIUM 40 MG/0.4ML ~~LOC~~ SOLN: 40.0000 mg | SUBCUTANEOUS | 0 refills | Status: DC

## 2016-09-18 MED ORDER — OXYCODONE HCL 5 MG PO TABS: 5.0000 mg | ORAL_TABLET | ORAL | 0 refills | Status: DC | PRN

## 2016-09-18 MED ORDER — CYCLOBENZAPRINE HCL 10 MG PO TABS
5.0000 mg | ORAL_TABLET | Freq: Three times a day (TID) | ORAL | Status: DC | PRN
  Administered 2016-09-18: 5 mg via ORAL
  Filled 2016-09-18: qty 1

## 2016-09-18 NOTE — Progress Notes (Signed)
Discharge instructions and medication details reviewed with patient and family. All questions answered. Lovenox education provided to patient and husband with teach back. Printed prescriptions for Lovenox and oxycodone given to patient. VS stable at this time. IV removed. Patient escorted out via wheelchair.  Wynema Birch, RN

## 2016-09-18 NOTE — Progress Notes (Signed)
Physical Therapy Treatment Patient Details Name: Paige Medina MRN: [id number] DOB: November 16, 1933 Today's Date: 09/18/2016    History of Present Illness  Pt underwent L partial knee replacement without reported post-op complications. She is POD#0 at time of initial evaluation    PT Comments    Patient with good progress towards all mobility goals this date, completing gait around nursing station with RW, no greater than cga/close sup required.  Stairs not required for entry/exit of home this date.  Educated in importance of active use of L LE with all functional mobility and closed-chain L TKE with standing activities; patient voiced understanding and agreement. Comfortable with upcoming discharge home and has no further questions at this time.    Follow Up Recommendations  Home health PT     Equipment Recommendations  None recommended by PT (has equipment in home)    Recommendations for Other Services       Precautions / Restrictions Precautions Precautions: Knee Precaution Booklet Issued: Yes (comment) Restrictions Weight Bearing Restrictions: Yes LLE Weight Bearing: Weight bearing as tolerated    Mobility  Bed Mobility Overal bed mobility: Modified Independent                Transfers Overall transfer level: Modified independent Equipment used: Rolling walker (2 wheeled) Transfers: Sit to/from Stand           General transfer comment: good comfort/confidence with movement pattern; encouraged to increase active use of L LE as pain allows  Ambulation/Gait Ambulation/Gait assistance: Supervision Ambulation Distance (Feet): 200 Feet Assistive device: Rolling walker (2 wheeled)   Gait velocity: 10' walk time 8 seconds   General Gait Details: reciprocal stepping pattern with good stance time/weight acceptance to L LE; min cuing for L TKE, L foot flat (in loading) and overall postural extension with gait efforts   Stairs            Wheelchair  Mobility    Modified Rankin (Stroke Patients Only)       Balance Overall balance assessment: Needs assistance Sitting-balance support: No upper extremity supported;Feet supported Sitting balance-Leahy Scale: Normal     Standing balance support: Bilateral upper extremity supported Standing balance-Leahy Scale: Good                              Cognition Arousal/Alertness: Awake/alert Behavior During Therapy: WFL for tasks assessed/performed Overall Cognitive Status: Within Functional Limits for tasks assessed                                        Exercises Total Joint Exercises Goniometric ROM: 6-95 degrees Other Exercises Other Exercises: Standing LE therex, 1x10, AROM with RW, cga/close sup: closed-chain TKE, mini squats, marching, hip flex/ext/abduct/adduct.  Good tolerance/performance for L LE WBing; good quad control/activation. Other Exercises: Verbally reviewed technique for car transfers and reviewed use, donning/doffing of polar care system; patient/family voiced understanding.    General Comments        Pertinent Vitals/Pain Pain Assessment: Faces Faces Pain Scale: Hurts a little bit Pain Location: L knee Pain Descriptors / Indicators: Aching Pain Intervention(s): Limited activity within patient's tolerance;Monitored during session;Repositioned    Home Living                      Prior Function  PT Goals (current goals can now be found in the care plan section) Acute Rehab PT Goals Patient Stated Goal: Return to prior level of function at home PT Goal Formulation: With patient Time For Goal Achievement: 10/01/16 Potential to Achieve Goals: Good Progress towards PT goals: Progressing toward goals    Frequency    BID      PT Plan      Co-evaluation              AM-PAC PT "6 Clicks" Daily Activity  Outcome Measure  Difficulty turning over in bed (including adjusting bedclothes, sheets  and blankets)?: None Difficulty moving from lying on back to sitting on the side of the bed? : None Difficulty sitting down on and standing up from a chair with arms (e.g., wheelchair, bedside commode, etc,.)?: None Help needed moving to and from a bed to chair (including a wheelchair)?: A Little Help needed walking in hospital room?: A Little Help needed climbing 3-5 steps with a railing? : A Little 6 Click Score: 21    End of Session Equipment Utilized During Treatment: Gait belt Activity Tolerance: Patient tolerated treatment well Patient left: in bed;with call bell/phone within reach;with bed alarm set;with family/visitor present Nurse Communication: Mobility status PT Visit Diagnosis: Other abnormalities of gait and mobility (R26.89);Muscle weakness (generalized) (M62.81);Pain Pain - Right/Left: Left Pain - part of body: Knee     Time: 1191-4782 PT Time Calculation (min) (ACUTE ONLY): 16 min  Charges:  $Gait Training: 8-22 mins                    G Codes:       Leane Loring H. Owens Shark, PT, DPT, NCS 09/18/16, 11:33 AM [phone]

## 2016-09-18 NOTE — Progress Notes (Signed)
Clinical Social Worker (CSW) received SNF consult. PT is recommending home health. RN case manager aware of above. Please reconsult if future social work needs arise. CSW signing off.   Sargon Scouten, LCSW [phone] 

## 2016-09-18 NOTE — Care Management Note (Addendum)
Case Management Note  Patient Details  Name: Paige Medina MRN: [id number] Date of Birth: 1933-10-29  Subjective/Objective: Cost of Lovenox is $ 85.00. Patient updated and denies concerns paying for medications.                    Action/Plan:   Expected Discharge Date:  09/18/16               Expected Discharge Plan:  [place]  In-House Referral:     Discharge planning Services  CM Consult  Post Acute Care Choice:  Home Health Choice offered to:  Patient  DME Arranged:    DME Agency:     HH Arranged:  PT [place]:  [facility] (formerly [employer])  Status of Service:  Completed, signed off  If discussed at [employer], dates discussed:    Additional Comments:  Jolly Mango, RN 09/18/2016, 11:20 AM

## 2016-09-18 NOTE — Progress Notes (Signed)
  Subjective: 1 Day Post-Op Procedure(s) (LRB): UNICOMPARTMENTAL KNEE (Left) Patient reports pain as mild in the left knee, reports more severe low back pain starting several days prior to surgery. Patient is well, but has had some minor complaints of low back pain. Plan is to go Home after hospital stay. Negative for chest pain and shortness of breath Fever: no Gastrointestinal:Negative for nausea and vomiting  Objective: Vital signs in last 24 hours: Temp:  [97.2 F (36.2 C)-98.6 F (37 C)] 98 F (36.7 C) (07/19 2341) Pulse Rate:  [60-76] 66 (07/19 2341) Resp:  [11-18] 18 (07/19 2004) BP: (115-156)/(46-76) 133/46 (07/19 2341) SpO2:  [96 %-100 %] 100 % (07/19 2341) Weight:  [69.9 kg (154 lb)] 69.9 kg (154 lb) (07/19 0928)  Intake/Output from previous day:  Intake/Output Summary (Last 24 hours) at 09/18/16 0724 Last data filed at 09/18/16 0300  Gross per 24 hour  Intake           2077.5 ml  Output              525 ml  Net           1552.5 ml    Intake/Output this shift: No intake/output data recorded.  Labs:  Recent Labs  09/18/16 0436  HGB 12.7    Recent Labs  09/18/16 0436  WBC 13.7*  RBC 3.92  HCT 37.7  PLT 222    Recent Labs  09/18/16 0436  NA 136  K 4.6  CL 109  CO2 22  BUN 24*  CREATININE 1.32*  GLUCOSE 149*  CALCIUM 8.7*   No results for input(s): LABPT, INR in the last 72 hours.   EXAM General - Patient is Alert, Appropriate and Oriented Extremity - ABD soft Sensation intact distally Intact pulses distally Dorsiflexion/Plantar flexion intact Incision: dressing C/D/I No cellulitis present Dressing/Incision - clean, dry, no drainage Motor Function - intact, moving foot and toes well on exam.  Abdomen soft with normal BS.  Past Medical History:  Diagnosis Date  . Arthritis 2004  . Benign colonic polyp   . Breast cancer ([place]) 2012   left breast, radiation  . Chronic kidney disease   . Diabetes mellitus without complication  ([place]) 7353   diet controlled, DMSE 05/2014  . GERD (gastroesophageal reflux disease)   . Heart murmur 1985  . Hyperlipidemia 2012  . Hypertension 1992  . 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 Rosacea     Assessment/Plan: 1 Day Post-Op Procedure(s) (LRB): UNICOMPARTMENTAL KNEE (Left) Active Problems:   Status post unicompartmental knee replacement, left  Estimated body mass index is 25.63 kg/m as calculated from the following:   Height as of this encounter: 5\' 5"  (1.651 m).   Weight as of this encounter: 69.9 kg (154 lb). Advance diet Up with therapy D/C IV fluids when tolerating po intake.  Labs reviewed, WBC 13.7, no fevers. Up with therapy today. Passing gas this AM, denies any N/V. Plan will be for discharge home following sessions with PT today.  DVT Prophylaxis - Lovenox, Foot Pumps and TED hose Weight-Bearing as tolerated to left leg  J. Cameron Proud, PA-C [facility] Orthopaedic Surgery 09/18/2016, 7:24 AM

## 2016-09-18 NOTE — Care Management Note (Signed)
Case Management Note  Patient Details  Name: Paige Medina MRN: [id number] Date of Birth: 12-Jan-1934  Subjective/Objective:  POD # 1 left knee replacement. Patient lives at home with her spouse who is her caregiver. She has 2 walkers, 2 wheeled and 4 wheeled. Offered choice of home health agencies. Referral to [facility] and they were notified patient would be discharging today. Pharmacy: [place] [facility] [phone]. Called Lovenox 40 mg # 14 no refills. PCP is Dr.Gutierrez.                    Action/Plan: No DME needs. [facility] for HHPT, Lovenox called in.   Expected Discharge Date:  09/18/16               Expected Discharge Plan:  [place]  In-House Referral:     Discharge planning Services  CM Consult  Post Acute Care Choice:  Home Health Choice offered to:  Patient  DME Arranged:    DME Agency:     HH Arranged:  PT [place]:  [facility] (formerly [employer])  Status of Service:  Completed, signed off  If discussed at [employer], dates discussed:    Additional Comments:  Jolly Mango, RN 09/18/2016, 8:31 AM

## 2016-09-18 NOTE — Care Management Important Message (Signed)
Important Message  Patient Details  Name: Paige Medina MRN: [id number] Date of Birth: [birth year]   Medicare Important Message Given:  N/A - LOS <3 / Initial given by admissions    Jolly Mango, RN 09/18/2016, 8:36 AM

## 2016-09-19 DIAGNOSIS — Z853 Personal history of malignant neoplasm of breast: Secondary | ICD-10-CM | POA: Diagnosis not present

## 2016-09-19 DIAGNOSIS — Z792 Long term (current) use of antibiotics: Secondary | ICD-10-CM | POA: Diagnosis not present

## 2016-09-19 DIAGNOSIS — Z9181 History of falling: Secondary | ICD-10-CM | POA: Diagnosis not present

## 2016-09-19 DIAGNOSIS — S72432D Displaced fracture of medial condyle of left femur, subsequent encounter for closed fracture with routine healing: Secondary | ICD-10-CM | POA: Diagnosis not present

## 2016-09-19 DIAGNOSIS — I129 Hypertensive chronic kidney disease with stage 1 through stage 4 chronic kidney disease, or unspecified chronic kidney disease: Secondary | ICD-10-CM | POA: Diagnosis not present

## 2016-09-19 DIAGNOSIS — Z79891 Long term (current) use of opiate analgesic: Secondary | ICD-10-CM | POA: Diagnosis not present

## 2016-09-19 DIAGNOSIS — E1122 Type 2 diabetes mellitus with diabetic chronic kidney disease: Secondary | ICD-10-CM | POA: Diagnosis not present

## 2016-09-19 DIAGNOSIS — Z86010 Personal history of colonic polyps: Secondary | ICD-10-CM | POA: Diagnosis not present

## 2016-09-19 DIAGNOSIS — N183 Chronic kidney disease, stage 3 (moderate): Secondary | ICD-10-CM | POA: Diagnosis not present

## 2016-09-19 DIAGNOSIS — Z96652 Presence of left artificial knee joint: Secondary | ICD-10-CM | POA: Diagnosis not present

## 2016-09-19 DIAGNOSIS — M1991 Primary osteoarthritis, unspecified site: Secondary | ICD-10-CM | POA: Diagnosis not present

## 2016-09-28 DIAGNOSIS — Z792 Long term (current) use of antibiotics: Secondary | ICD-10-CM | POA: Diagnosis not present

## 2016-09-28 DIAGNOSIS — Z9181 History of falling: Secondary | ICD-10-CM | POA: Diagnosis not present

## 2016-09-28 DIAGNOSIS — Z86010 Personal history of colonic polyps: Secondary | ICD-10-CM | POA: Diagnosis not present

## 2016-09-28 DIAGNOSIS — S72432D Displaced fracture of medial condyle of left femur, subsequent encounter for closed fracture with routine healing: Secondary | ICD-10-CM | POA: Diagnosis not present

## 2016-09-28 DIAGNOSIS — N183 Chronic kidney disease, stage 3 (moderate): Secondary | ICD-10-CM | POA: Diagnosis not present

## 2016-09-28 DIAGNOSIS — Z96652 Presence of left artificial knee joint: Secondary | ICD-10-CM | POA: Diagnosis not present

## 2016-09-28 DIAGNOSIS — Z853 Personal history of malignant neoplasm of breast: Secondary | ICD-10-CM | POA: Diagnosis not present

## 2016-09-28 DIAGNOSIS — E1122 Type 2 diabetes mellitus with diabetic chronic kidney disease: Secondary | ICD-10-CM | POA: Diagnosis not present

## 2016-09-28 DIAGNOSIS — Z79891 Long term (current) use of opiate analgesic: Secondary | ICD-10-CM | POA: Diagnosis not present

## 2016-09-28 DIAGNOSIS — M1991 Primary osteoarthritis, unspecified site: Secondary | ICD-10-CM | POA: Diagnosis not present

## 2016-09-28 DIAGNOSIS — I129 Hypertensive chronic kidney disease with stage 1 through stage 4 chronic kidney disease, or unspecified chronic kidney disease: Secondary | ICD-10-CM | POA: Diagnosis not present

## 2016-09-30 NOTE — Progress Notes (Signed)
Pre visit review using our clinic review tool, if applicable. No additional management support is needed unless otherwise documented below in the visit note. 

## 2016-10-02 DIAGNOSIS — M6281 Muscle weakness (generalized): Secondary | ICD-10-CM | POA: Diagnosis not present

## 2016-10-02 DIAGNOSIS — Z96652 Presence of left artificial knee joint: Secondary | ICD-10-CM | POA: Diagnosis not present

## 2016-10-02 DIAGNOSIS — G8929 Other chronic pain: Secondary | ICD-10-CM | POA: Diagnosis not present

## 2016-10-02 DIAGNOSIS — M25662 Stiffness of left knee, not elsewhere classified: Secondary | ICD-10-CM | POA: Diagnosis not present

## 2016-10-02 DIAGNOSIS — M25562 Pain in left knee: Secondary | ICD-10-CM | POA: Diagnosis not present

## 2016-10-03 ENCOUNTER — Other Ambulatory Visit: Payer: Self-pay | Admitting: Family Medicine

## 2016-10-03 DIAGNOSIS — E1122 Type 2 diabetes mellitus with diabetic chronic kidney disease: Secondary | ICD-10-CM

## 2016-10-03 DIAGNOSIS — N183 Chronic kidney disease, stage 3 (moderate): Secondary | ICD-10-CM

## 2016-10-03 DIAGNOSIS — E785 Hyperlipidemia, unspecified: Secondary | ICD-10-CM

## 2016-10-03 DIAGNOSIS — E119 Type 2 diabetes mellitus without complications: Secondary | ICD-10-CM

## 2016-10-05 ENCOUNTER — Other Ambulatory Visit (INDEPENDENT_AMBULATORY_CARE_PROVIDER_SITE_OTHER): Payer: PPO

## 2016-10-05 DIAGNOSIS — E1122 Type 2 diabetes mellitus with diabetic chronic kidney disease: Secondary | ICD-10-CM

## 2016-10-05 DIAGNOSIS — E119 Type 2 diabetes mellitus without complications: Secondary | ICD-10-CM | POA: Diagnosis not present

## 2016-10-05 DIAGNOSIS — E785 Hyperlipidemia, unspecified: Secondary | ICD-10-CM

## 2016-10-05 DIAGNOSIS — N183 Chronic kidney disease, stage 3 (moderate): Secondary | ICD-10-CM

## 2016-10-05 NOTE — Addendum Note (Signed)
Addended by: Daralene Milch C on: 10/05/2016 10:00 AM   Modules accepted: Orders

## 2016-10-06 ENCOUNTER — Ambulatory Visit: Payer: PPO | Admitting: Family Medicine

## 2016-10-06 LAB — RENAL FUNCTION PANEL
Albumin: 4 g/dL (ref 3.6–5.1)
BUN: 24 mg/dL (ref 7–25)
CALCIUM: 9.8 mg/dL (ref 8.6–10.4)
CO2: 16 mmol/L — ABNORMAL LOW (ref 20–32)
Chloride: 103 mmol/L (ref 98–110)
Creat: 1.09 mg/dL — ABNORMAL HIGH (ref 0.60–0.88)
GLUCOSE: 141 mg/dL — AB (ref 65–99)
PHOSPHORUS: 3.9 mg/dL (ref 2.1–4.3)
POTASSIUM: 4.6 mmol/L (ref 3.5–5.3)
Sodium: 140 mmol/L (ref 135–146)

## 2016-10-06 LAB — LIPID PANEL
CHOL/HDL RATIO: 3.5 ratio (ref <5.0)
CHOLESTEROL: 168 mg/dL (ref <200)
HDL: 48 mg/dL — ABNORMAL LOW (ref >50)
LDL Cholesterol: 96 mg/dL (ref <100)
Triglycerides: 120 mg/dL (ref <150)
VLDL: 24 mg/dL (ref <30)

## 2016-10-06 LAB — PARATHYROID HORMONE, INTACT (NO CA): PTH: 58 pg/mL (ref 14–64)

## 2016-10-06 LAB — HEMOGLOBIN A1C
Hgb A1c MFr Bld: 6.6 % — ABNORMAL HIGH (ref <5.7)
MEAN PLASMA GLUCOSE: 143 mg/dL

## 2016-10-06 LAB — VITAMIN D 25 HYDROXY (VIT D DEFICIENCY, FRACTURES): Vit D, 25-Hydroxy: 36 ng/mL (ref 30–100)

## 2016-10-07 ENCOUNTER — Ambulatory Visit (INDEPENDENT_AMBULATORY_CARE_PROVIDER_SITE_OTHER): Payer: PPO

## 2016-10-07 VITALS — BP 108/56 | HR 60 | Temp 98.1°F | Ht 65.5 in | Wt 147.8 lb

## 2016-10-07 DIAGNOSIS — Z96652 Presence of left artificial knee joint: Secondary | ICD-10-CM | POA: Diagnosis not present

## 2016-10-07 DIAGNOSIS — Z0000 Encounter for general adult medical examination without abnormal findings: Secondary | ICD-10-CM | POA: Diagnosis not present

## 2016-10-07 DIAGNOSIS — M25662 Stiffness of left knee, not elsewhere classified: Secondary | ICD-10-CM | POA: Diagnosis not present

## 2016-10-07 DIAGNOSIS — G8929 Other chronic pain: Secondary | ICD-10-CM | POA: Diagnosis not present

## 2016-10-07 DIAGNOSIS — M25562 Pain in left knee: Secondary | ICD-10-CM | POA: Diagnosis not present

## 2016-10-07 DIAGNOSIS — M6281 Muscle weakness (generalized): Secondary | ICD-10-CM | POA: Diagnosis not present

## 2016-10-07 NOTE — Patient Instructions (Signed)
**PLEASE contact insurance regarding coverage for tetanus vaccine.**  [name] , Thank you for taking time to come for your Medicare Wellness Visit. I appreciate your ongoing commitment to your health goals. Please review the following plan we discussed and let me know if I can assist you in the future.   These are the goals we discussed: Goals    . Increase physical activity          When tolerated, I will resume exercising for at least 60 min 3 days per week.        This is a list of the screening recommended for you and due dates:  Health Maintenance  Topic Date Due  . Complete foot exam   10/13/2016*  . Flu Shot  05/30/2017*  . Tetanus Vaccine  07/26/2025*  . DTaP/Tdap/Td vaccine (1 - Tdap) 07/28/2025*  . Hemoglobin A1C  04/07/2017  . Eye exam for diabetics  05/01/2017  . DEXA scan (bone density measurement)  Completed  . Pneumonia vaccines  Completed  *Topic was postponed. The date shown is not the original due date.   Preventive Care for Adults  A healthy lifestyle and preventive care can promote health and wellness. Preventive health guidelines for adults include the following key practices.  . A routine yearly physical is a good way to check with your health care provider about your health and preventive screening. It is a chance to share any concerns and updates on your health and to receive a thorough exam.  . Visit your dentist for a routine exam and preventive care every 6 months. Brush your teeth twice a day and floss once a day. Good oral hygiene prevents tooth decay and gum disease.  . The frequency of eye exams is based on your age, health, family medical history, use  of contact lenses, and other factors. Follow your health care provider's ecommendations for frequency of eye exams.  . Eat a healthy diet. Foods like vegetables, fruits, whole grains, low-fat dairy products, and lean protein foods contain the nutrients you need without too many calories. Decrease  your intake of foods high in solid fats, added sugars, and salt. Eat the right amount of calories for you. Get information about a proper diet from your health care provider, if necessary.  . Regular physical exercise is one of the most important things you can do for your health. Most adults should get at least 150 minutes of moderate-intensity exercise (any activity that increases your heart rate and causes you to sweat) each week. In addition, most adults need muscle-strengthening exercises on 2 or more days a week.  Silver Sneakers may be a benefit available to you. To determine eligibility, you may visit the website: www.silversneakers.com or contact program at 503-370-4477 Mon-Fri between 8AM-8PM.   . Maintain a healthy weight. The body mass index (BMI) is a screening tool to identify possible weight problems. It provides an estimate of body fat based on height and weight. Your health care provider can find your BMI and can help you achieve or maintain a healthy weight.   For adults 20 years and older: ? A BMI below 18.5 is considered underweight. ? A BMI of 18.5 to 24.9 is normal. ? A BMI of 25 to 29.9 is considered overweight. ? A BMI of 30 and above is considered obese.   . Maintain normal blood lipids and cholesterol levels by exercising and minimizing your intake of saturated fat. Eat a balanced diet with plenty of fruit and  vegetables. Blood tests for lipids and cholesterol should begin at age 13 and be repeated every 5 years. If your lipid or cholesterol levels are high, you are over 50, or you are at high risk for heart disease, you may need your cholesterol levels checked more frequently. Ongoing high lipid and cholesterol levels should be treated with medicines if diet and exercise are not working.  . If you smoke, find out from your health care provider how to quit. If you do not use tobacco, please do not start.  . If you choose to drink alcohol, please do not consume more than  2 drinks per day. One drink is considered to be 12 ounces (355 mL) of beer, 5 ounces (148 mL) of wine, or 1.5 ounces (44 mL) of liquor.  . If you are 54-1 years old, ask your health care provider if you should take aspirin to prevent strokes.  . Use sunscreen. Apply sunscreen liberally and repeatedly throughout the day. You should seek shade when your shadow is shorter than you. Protect yourself by wearing long sleeves, pants, a wide-brimmed hat, and sunglasses year round, whenever you are outdoors.  . Once a month, do a whole body skin exam, using a mirror to look at the skin on your back. Tell your health care provider of new moles, moles that have irregular borders, moles that are larger than a pencil eraser, or moles that have changed in shape or color.

## 2016-10-07 NOTE — Progress Notes (Signed)
PCP notes:   Health maintenance:  Foot exam - PCP please address at next appt Tetanus vaccine - postponed/insurance Flu vaccine - addressed  Abnormal screenings:   Fall risk - hx of fall without injury and no medical treatment  Patient concerns:   None  Nurse concerns:  None  Next PCP appt:   10/13/16 @ 0900
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
## 2016-10-08 NOTE — Progress Notes (Signed)
I reviewed health advisor's note, was available for consultation, and agree with documentation and plan.  

## 2016-10-09 DIAGNOSIS — Z96652 Presence of left artificial knee joint: Secondary | ICD-10-CM | POA: Diagnosis not present

## 2016-10-09 DIAGNOSIS — M25562 Pain in left knee: Secondary | ICD-10-CM | POA: Diagnosis not present

## 2016-10-09 DIAGNOSIS — M25662 Stiffness of left knee, not elsewhere classified: Secondary | ICD-10-CM | POA: Diagnosis not present

## 2016-10-09 DIAGNOSIS — G8929 Other chronic pain: Secondary | ICD-10-CM | POA: Diagnosis not present

## 2016-10-09 DIAGNOSIS — M6281 Muscle weakness (generalized): Secondary | ICD-10-CM | POA: Diagnosis not present

## 2016-10-12 DIAGNOSIS — Z96652 Presence of left artificial knee joint: Secondary | ICD-10-CM | POA: Diagnosis not present

## 2016-10-13 ENCOUNTER — Ambulatory Visit (INDEPENDENT_AMBULATORY_CARE_PROVIDER_SITE_OTHER): Payer: PPO | Admitting: Family Medicine

## 2016-10-13 ENCOUNTER — Encounter: Payer: Self-pay | Admitting: Family Medicine

## 2016-10-13 VITALS — BP 122/60 | HR 62 | Temp 98.1°F | Ht 66.0 in | Wt 149.5 lb

## 2016-10-13 DIAGNOSIS — I10 Essential (primary) hypertension: Secondary | ICD-10-CM | POA: Diagnosis not present

## 2016-10-13 DIAGNOSIS — N183 Chronic kidney disease, stage 3 unspecified: Secondary | ICD-10-CM

## 2016-10-13 DIAGNOSIS — Z7189 Other specified counseling: Secondary | ICD-10-CM | POA: Diagnosis not present

## 2016-10-13 DIAGNOSIS — Z0000 Encounter for general adult medical examination without abnormal findings: Secondary | ICD-10-CM | POA: Diagnosis not present

## 2016-10-13 DIAGNOSIS — E1122 Type 2 diabetes mellitus with diabetic chronic kidney disease: Secondary | ICD-10-CM | POA: Diagnosis not present

## 2016-10-13 DIAGNOSIS — E119 Type 2 diabetes mellitus without complications: Secondary | ICD-10-CM

## 2016-10-13 DIAGNOSIS — E785 Hyperlipidemia, unspecified: Secondary | ICD-10-CM

## 2016-10-13 NOTE — Patient Instructions (Addendum)
You are doing well today.  Cut losartan in half and continue monitoring blood pressures closely.  Let me know if running too low or too high despite this.  Return as needed or in 6 months for follow up visit.   Health Maintenance, Female Adopting a healthy lifestyle and getting preventive care can go a long way to promote health and wellness. Talk with your health care provider about what schedule of regular examinations is right for you. This is a good chance for you to check in with your provider about disease prevention and staying healthy. In between checkups, there are plenty of things you can do on your own. Experts have done a lot of research about which lifestyle changes and preventive measures are most likely to keep you healthy. Ask your health care provider for more information. Weight and diet Eat a healthy diet  Be sure to include plenty of vegetables, fruits, low-fat dairy products, and lean protein.  Do not eat a lot of foods high in solid fats, added sugars, or salt.  Get regular exercise. This is one of the most important things you can do for your health. ? Most adults should exercise for at least 150 minutes each week. The exercise should increase your heart rate and make you sweat (moderate-intensity exercise). ? Most adults should also do strengthening exercises at least twice a week. This is in addition to the moderate-intensity exercise.  Maintain a healthy weight  Body mass index (BMI) is a measurement that can be used to identify possible weight problems. It estimates body fat based on height and weight. Your health care provider can help determine your BMI and help you achieve or maintain a healthy weight.  For females 73 years of age and older: ? A BMI below 18.5 is considered underweight. ? A BMI of 18.5 to 24.9 is normal. ? A BMI of 25 to 29.9 is considered overweight. ? A BMI of 30 and above is considered obese.  Watch levels of cholesterol and blood  lipids  You should start having your blood tested for lipids and cholesterol at 81 years of age, then have this test every 5 years.  You may need to have your cholesterol levels checked more often if: ? Your lipid or cholesterol levels are high. ? You are older than 81 years of age. ? You are at high risk for heart disease.  Cancer screening Lung Cancer  Lung cancer screening is recommended for adults 67-36 years old who are at high risk for lung cancer because of a history of smoking.  A yearly low-dose CT scan of the lungs is recommended for people who: ? Currently smoke. ? Have quit within the past 15 years. ? Have at least a 30-pack-year history of smoking. A pack year is smoking an average of one pack of cigarettes a day for 1 year.  Yearly screening should continue until it has been 15 years since you quit.  Yearly screening should stop if you develop a health problem that would prevent you from having lung cancer treatment.  Breast Cancer  Practice breast self-awareness. This means understanding how your breasts normally appear and feel.  It also means doing regular breast self-exams. Let your health care provider know about any changes, no matter how small.  If you are in your 20s or 30s, you should have a clinical breast exam (CBE) by a health care provider every 1-3 years as part of a regular health exam.  If you are  12 or older, have a CBE every year. Also consider having a breast X-ray (mammogram) every year.  If you have a family history of breast cancer, talk to your health care provider about genetic screening.  If you are at high risk for breast cancer, talk to your health care provider about having an MRI and a mammogram every year.  Breast cancer gene (BRCA) assessment is recommended for women who have family members with BRCA-related cancers. BRCA-related cancers include: ? Breast. ? Ovarian. ? Tubal. ? Peritoneal cancers.  Results of the assessment will  determine the need for genetic counseling and BRCA1 and BRCA2 testing.  Cervical Cancer Your health care provider may recommend that you be screened regularly for cancer of the pelvic organs (ovaries, uterus, and vagina). This screening involves a pelvic examination, including checking for microscopic changes to the surface of your cervix (Pap test). You may be encouraged to have this screening done every 3 years, beginning at age 85.  For women ages 47-65, health care providers may recommend pelvic exams and Pap testing every 3 years, or they may recommend the Pap and pelvic exam, combined with testing for human papilloma virus (HPV), every 5 years. Some types of HPV increase your risk of cervical cancer. Testing for HPV may also be done on women of any age with unclear Pap test results.  Other health care providers may not recommend any screening for nonpregnant women who are considered low risk for pelvic cancer and who do not have symptoms. Ask your health care provider if a screening pelvic exam is right for you.  If you have had past treatment for cervical cancer or a condition that could lead to cancer, you need Pap tests and screening for cancer for at least 20 years after your treatment. If Pap tests have been discontinued, your risk factors (such as having a new sexual partner) need to be reassessed to determine if screening should resume. Some women have medical problems that increase the chance of getting cervical cancer. In these cases, your health care provider may recommend more frequent screening and Pap tests.  Colorectal Cancer  This type of cancer can be detected and often prevented.  Routine colorectal cancer screening usually begins at 81 years of age and continues through 81 years of age.  Your health care provider may recommend screening at an earlier age if you have risk factors for colon cancer.  Your health care provider may also recommend using home test kits to check  for hidden blood in the stool.  A small camera at the end of a tube can be used to examine your colon directly (sigmoidoscopy or colonoscopy). This is done to check for the earliest forms of colorectal cancer.  Routine screening usually begins at age 67.  Direct examination of the colon should be repeated every 5-10 years through 81 years of age. However, you may need to be screened more often if early forms of precancerous polyps or small growths are found.  Skin Cancer  Check your skin from head to toe regularly.  Tell your health care provider about any new moles or changes in moles, especially if there is a change in a mole's shape or color.  Also tell your health care provider if you have a mole that is larger than the size of a pencil eraser.  Always use sunscreen. Apply sunscreen liberally and repeatedly throughout the day.  Protect yourself by wearing long sleeves, pants, a wide-brimmed hat, and sunglasses whenever you  are outside.  Heart disease, diabetes, and high blood pressure  High blood pressure causes heart disease and increases the risk of stroke. High blood pressure is more likely to develop in: ? People who have blood pressure in the high end of the normal range (130-139/85-89 mm Hg). ? People who are overweight or obese. ? People who are African American.  If you are 93-60 years of age, have your blood pressure checked every 3-5 years. If you are 55 years of age or older, have your blood pressure checked every year. You should have your blood pressure measured twice-once when you are at a hospital or clinic, and once when you are not at a hospital or clinic. Record the average of the two measurements. To check your blood pressure when you are not at a hospital or clinic, you can use: ? An automated blood pressure machine at a pharmacy. ? A home blood pressure monitor.  If you are between 61 years and 33 years old, ask your health care provider if you should take  aspirin to prevent strokes.  Have regular diabetes screenings. This involves taking a blood sample to check your fasting blood sugar level. ? If you are at a normal weight and have a low risk for diabetes, have this test once every three years after 81 years of age. ? If you are overweight and have a high risk for diabetes, consider being tested at a younger age or more often. Preventing infection Hepatitis B  If you have a higher risk for hepatitis B, you should be screened for this virus. You are considered at high risk for hepatitis B if: ? You were born in a country where hepatitis B is common. Ask your health care provider which countries are considered high risk. ? Your parents were born in a high-risk country, and you have not been immunized against hepatitis B (hepatitis B vaccine). ? You have HIV or AIDS. ? You use needles to inject street drugs. ? You live with someone who has hepatitis B. ? You have had sex with someone who has hepatitis B. ? You get hemodialysis treatment. ? You take certain medicines for conditions, including cancer, organ transplantation, and autoimmune conditions.  Hepatitis C  Blood testing is recommended for: ? Everyone born from [birth year] through 1963. ? Anyone with known risk factors for hepatitis C.  Sexually transmitted infections (STIs)  You should be screened for sexually transmitted infections (STIs) including gonorrhea and chlamydia if: ? You are sexually active and are younger than 81 years of age. ? You are older than 81 years of age and your health care provider tells you that you are at risk for this type of infection. ? Your sexual activity has changed since you were last screened and you are at an increased risk for chlamydia or gonorrhea. Ask your health care provider if you are at risk.  If you do not have HIV, but are at risk, it may be recommended that you take a prescription medicine daily to prevent HIV infection. This is called  pre-exposure prophylaxis (PrEP). You are considered at risk if: ? You are sexually active and do not regularly use condoms or know the HIV status of your partner(s). ? You take drugs by injection. ? You are sexually active with a partner who has HIV.  Talk with your health care provider about whether you are at high risk of being infected with HIV. If you choose to begin PrEP, you should first be  tested for HIV. You should then be tested every 3 months for as long as you are taking PrEP. Pregnancy  If you are premenopausal and you may become pregnant, ask your health care provider about preconception counseling.  If you may become pregnant, take 400 to 800 micrograms (mcg) of folic acid every day.  If you want to prevent pregnancy, talk to your health care provider about birth control (contraception). Osteoporosis and menopause  Osteoporosis is a disease in which the bones lose minerals and strength with aging. This can result in serious bone fractures. Your risk for osteoporosis can be identified using a bone density scan.  If you are 65 years of age or older, or if you are at risk for osteoporosis and fractures, ask your health care provider if you should be screened.  Ask your health care provider whether you should take a calcium or vitamin D supplement to lower your risk for osteoporosis.  Menopause may have certain physical symptoms and risks.  Hormone replacement therapy may reduce some of these symptoms and risks. Talk to your health care provider about whether hormone replacement therapy is right for you. Follow these instructions at home:  Schedule regular health, dental, and eye exams.  Stay current with your immunizations.  Do not use any tobacco products including cigarettes, chewing tobacco, or electronic cigarettes.  If you are pregnant, do not drink alcohol.  If you are breastfeeding, limit how much and how often you drink alcohol.  Limit alcohol intake to no more  than 1 drink per day for nonpregnant women. One drink equals 12 ounces of beer, 5 ounces of wine, or 1 ounces of hard liquor.  Do not use street drugs.  Do not share needles.  Ask your health care provider for help if you need support or information about quitting drugs.  Tell your health care provider if you often feel depressed.  Tell your health care provider if you have ever been abused or do not feel safe at home. This information is not intended to replace advice given to you by your health care provider. Make sure you discuss any questions you have with your health care provider. Document Released: 09/01/2010 Document Revised: 07/25/2015 Document Reviewed: 11/20/2014 Elsevier Interactive Patient Education  2018 [employer]  

## 2016-10-13 NOTE — Assessment & Plan Note (Signed)
Remains diet controlled diabetes. RTC 6 mo DM f/u visit.

## 2016-10-13 NOTE — Assessment & Plan Note (Signed)
Chronic, some fluctuations noted - low readings recently. Will decrease losartan back to 50mg  daily and pt will monitor bp closely. Update [place] with effect.

## 2016-10-13 NOTE — Assessment & Plan Note (Signed)
Preventative protocols reviewed and updated unless pt declined. Discussed healthy diet and lifestyle.  

## 2016-10-13 NOTE — Assessment & Plan Note (Addendum)
Scanned into chart 10/2014. [name] husband then Ray Church and Wille Glaser are [facility]. Does not want prolonged life support if terminal or irreversible condition.

## 2016-10-13 NOTE — Progress Notes (Signed)
BP 122/60 (BP Location: Right Arm, Cuff Size: Normal)   Pulse 62   Temp 98.1 F (36.7 C) (Oral)   Ht 5\' 6"  (1.676 m)   Wt 149 lb 8 oz (67.8 kg)   SpO2 97%   BMI 24.13 kg/m    CC: CPE Subjective:    Patient ID: Paige Medina, female    DOB: December 30, 1933, 83 y.o.   MRN: [id number]  HPI: Paige Medina is a 83 y.o. female presenting on 10/13/2016 for Annual Exam (Medicare pt 2)   Saw Katha Cabal last week for medicare wellness visit. Note reviewed. Due for foot exam  BP elevated today. Brings log of blood pressures: 96-140/60-70, HRN 50s.   Recent partial knee replacement last month ([facility]). Has not tolerated oxycodone, tramadol - nausea, constipation. Trouble finding pain medication. She has been taking tylenol 1300mg  BID.   Preventative: COLONOSCOPY 12/2014 mod diverticulosis o/w WNL, f/u prn [name]) Well woman - s/p hysterectomy 1977 Mammogram 10/2015, pending - scheduled by Dr. Jamal Collin. H/o breast cancer followed closely by Dr. Jamal Collin.  Dexa - 08/2012 - WNL Flu yearly Pneumovax 06/2005. prevnar 2015 Td 06/2005  zostavax 04/2005 shingrix - discussed Advanced directives: Scanned into chart 10/2014. [name] husband then Ray Church and Wille Glaser are [facility]. Does not want prolonged life support if terminal or irreversible condition. Seat belt use discussed Sunscreen use discussed. No changing moles on skin. Non smoker Alcohol - rare  Lives with husband, no pets  Grown children  Occupation: retired, varied jobs Veterinary surgeon  Activity: gym 3d/wk, active mentally with crosswords  Diet: good water, fruits/vegetables daily   Relevant past medical, surgical, family and social history reviewed and updated as indicated. Interim medical history since our last visit reviewed. Allergies and medications reviewed and updated. Outpatient Medications Prior to Visit  Medication Sig Dispense Refill  . acetaminophen (TYLENOL 8 HOUR ARTHRITIS PAIN) 650 MG CR tablet Take 1,300 mg by mouth every 8  (eight) hours as needed for pain.    . Ascorbic Acid (VITAMIN C PO) Take 1 tablet by mouth daily as needed (for cold symptoms).     . beta carotene w/minerals (OCUVITE) tablet Take 1 tablet by mouth every other day.    . Calcium-Phosphorus-Vitamin D (CITRACAL +D3 PO) Take 1 tablet by mouth daily with breakfast.     . clobetasol (TEMOVATE) 0.05 % external solution Apply 1 application topically 2 (two) times daily as needed (for rash on scalp).    . clobetasol cream (TEMOVATE) 5.64 % Apply 1 application topically 2 (two) times daily. (Patient taking differently: Apply 1 application topically 2 (two) times daily as needed (for vaginal irritation). ) 45 g 1  . Coenzyme Q10 (CO Q-10) 200 MG CAPS Take 200 mg by mouth every morning.     . docusate sodium (COLACE) 100 MG capsule Take 100 mg by mouth at bedtime.    [name] doxycycline (ADOXA) 50 MG tablet Take 50 mg by mouth daily as needed.    [name] estradiol (ESTRACE) 0.1 MG/GM vaginal cream Place 1 Applicatorful vaginally 2 (two) times a week. (Patient taking differently: Place 1 Applicatorful vaginally once a week. ) 42.5 g 3  . Krill Oil 500 MG CAPS Take 500 mg by mouth every morning.     . lovastatin (MEVACOR) 40 MG tablet TAKE ONE TABLET BY MOUTH EVERY NIGHT AT BEDTIME 90 tablet 3  . metoprolol succinate (TOPROL-XL) 25 MG 24 hr tablet TAKE 1/2 TABLET BY MOUTH EVERY DAY. WITHA MEAL. (Patient taking differently: TAKE 1/2  TABLET BY MOUTH EVERY DAY. WITHA MEAL, breakfast.) 45 tablet 3  . metroNIDAZOLE (METROCREAM) 0.75 % cream Apply 1 application topically at bedtime as needed. For rosacea.  3  . Probiotic Product (PROBIOTIC DAILY PO) Take 1 capsule by mouth at bedtime.     . triamcinolone cream (KENALOG) 0.1 % Apply 1 application topically 2 (two) times daily.    . Turmeric 500 MG CAPS Take 500 mg by mouth at bedtime.    [name] losartan (COZAAR) 100 MG tablet Take 1 tablet (100 mg total) by mouth daily. PLEASE DO NOT FILL UNTIL PT CALLS. (Patient taking differently:  Take 100 mg by mouth every morning. PLEASE DO NOT FILL UNTIL PT CALLS.) 90 tablet 3  . Zinc Sulfate (ZINC 15 PO) Take 15 mg by mouth as needed (with cold symptoms). Reported on 08/16/2015     No facility-administered medications prior to visit.      Per HPI unless specifically indicated in ROS section below Review of Systems  Constitutional: Negative for activity change, appetite change, chills, fatigue, fever and unexpected weight change.  HENT: Negative for hearing loss.   Eyes: Negative for visual disturbance.  Respiratory: Negative for cough, chest tightness, shortness of breath and wheezing.   Cardiovascular: Positive for leg swelling. Negative for chest pain and palpitations.  Gastrointestinal: Negative for abdominal distention, abdominal pain, blood in stool, constipation, diarrhea, nausea and vomiting.  Genitourinary: Negative for difficulty urinating and hematuria.  Musculoskeletal: Negative for arthralgias, myalgias and neck pain.  Skin: Negative for rash.  Neurological: Positive for dizziness (low blood pressures). Negative for seizures, syncope and headaches.  Hematological: Negative for adenopathy. Does not bruise/bleed easily.  Psychiatric/Behavioral: Negative for dysphoric mood. The patient is not nervous/anxious.        Objective:    BP 122/60 (BP Location: Right Arm, Cuff Size: Normal)   Pulse 62   Temp 98.1 F (36.7 C) (Oral)   Ht 5\' 6"  (1.676 m)   Wt 149 lb 8 oz (67.8 kg)   SpO2 97%   BMI 24.13 kg/m   Wt Readings from Last 3 Encounters:  10/13/16 149 lb 8 oz (67.8 kg)  10/07/16 147 lb 12 oz (67 kg)  09/17/16 154 lb (69.9 kg)    Physical Exam  Constitutional: She is oriented to person, place, and time. She appears well-developed and well-nourished. No distress.  HENT:  Head: Normocephalic and atraumatic.  Right Ear: Hearing, tympanic membrane, external ear and ear canal normal.  Left Ear: Hearing, tympanic membrane, external ear and ear canal normal.    Nose: Nose normal.  Mouth/Throat: Uvula is midline, oropharynx is clear and moist and mucous membranes are normal. No oropharyngeal exudate, posterior oropharyngeal edema or posterior oropharyngeal erythema.  Eyes: Pupils are equal, round, and reactive to light. Conjunctivae and EOM are normal. No scleral icterus.  Neck: Normal range of motion. Neck supple. Carotid bruit is not present. No thyromegaly present.  Cardiovascular: Normal rate, regular rhythm, normal heart sounds and intact distal pulses.   No murmur heard. Pulses:      Radial pulses are 2+ on the right side, and 2+ on the left side.  Pulmonary/Chest: Effort normal and breath sounds normal. No respiratory distress. She has no wheezes. She has no rales.  Abdominal: Soft. Bowel sounds are normal. She exhibits no distension and no mass. There is no tenderness. There is no rebound and no guarding.  Musculoskeletal: Normal range of motion. She exhibits no edema.  L knee incision c/d/i  Lymphadenopathy:  She has no cervical adenopathy.  Neurological: She is alert and oriented to person, place, and time.  CN grossly intact, station and gait intact  Skin: Skin is warm and dry. No rash noted.  Psychiatric: She has a normal mood and affect. Her behavior is normal. Judgment and thought content normal.  Nursing note and vitals reviewed.  Results for orders placed or performed in visit on 10/05/16  Parathyroid hormone, intact (no Ca)  Result Value Ref Range   PTH 58 14 - 64 pg/mL  Lipid panel  Result Value Ref Range   Cholesterol 168 <200 mg/dL   Triglycerides 120 <150 mg/dL   HDL 48 (L) >50 mg/dL   Total CHOL/HDL Ratio 3.5 <5.0 Ratio   VLDL 24 <30 mg/dL   LDL Cholesterol 96 <100 mg/dL  Hemoglobin A1c  Result Value Ref Range   Hgb A1c MFr Bld 6.6 (H) <5.7 %   Mean Plasma Glucose 143 mg/dL  VITAMIN D 25 Hydroxy (Vit-D Deficiency, Fractures)  Result Value Ref Range   Vit D, 25-Hydroxy 36 30 - 100 ng/mL  Renal function panel   Result Value Ref Range   Sodium 140 135 - 146 mmol/L   Potassium 4.6 3.5 - 5.3 mmol/L   Chloride 103 98 - 110 mmol/L   CO2 16 (L) 20 - 32 mmol/L   Glucose, Bld 141 (H) 65 - 99 mg/dL   BUN 24 7 - 25 mg/dL   Creat 1.09 (H) 0.60 - 0.88 mg/dL   Albumin 4.0 3.6 - 5.1 g/dL   Calcium 9.8 8.6 - 10.4 mg/dL   Phosphorus 3.9 2.1 - 4.3 mg/dL      Assessment & Plan:   Problem List Items Addressed This Visit    Advanced care planning/counseling discussion    Scanned into chart 10/2014. [name] husband then Ray Church and Wille Glaser are [facility]. Does not want prolonged life support if terminal or irreversible condition.      CKD stage 3 due to type 2 diabetes mellitus (HCC)    Chronic, stable. Pt aware to stay well hydrated and avoid NSAIDs.       Relevant Medications   losartan (COZAAR) 100 MG tablet   Diabetes mellitus without complication (HCC)    Remains diet controlled diabetes. RTC 6 mo DM f/u visit.       Relevant Medications   losartan (COZAAR) 100 MG tablet   Health maintenance examination - Primary    Preventative protocols reviewed and updated unless pt declined. Discussed healthy diet and lifestyle.       Hyperlipidemia    Chronic, stable. Continue current regimen of lovastatin and krill oil. The ASCVD Risk score Mikey Bussing DC Jr., et al., 2013) failed to calculate for the following reasons:   The 2013 ASCVD risk score is only valid for ages 47 to 27       Relevant Medications   losartan (COZAAR) 100 MG tablet   Hypertension    Chronic, some fluctuations noted - low readings recently. Will decrease losartan back to 50mg  daily and pt will monitor bp closely. Update [place] with effect.       Relevant Medications   losartan (COZAAR) 100 MG tablet       Follow up plan: Return in about 6 months (around 04/15/2017) for follow up visit.  [name], MD

## 2016-10-13 NOTE — Assessment & Plan Note (Addendum)
Chronic, stable. Pt aware to stay well hydrated and avoid NSAIDs.

## 2016-10-13 NOTE — Assessment & Plan Note (Signed)
Chronic, stable. Continue current regimen of lovastatin and krill oil. The ASCVD Risk score Mikey Bussing DC Jr., et al., 2013) failed to calculate for the following reasons:   The 2013 ASCVD risk score is only valid for ages 66 to 73

## 2016-10-14 DIAGNOSIS — M6281 Muscle weakness (generalized): Secondary | ICD-10-CM | POA: Diagnosis not present

## 2016-10-14 DIAGNOSIS — Z96652 Presence of left artificial knee joint: Secondary | ICD-10-CM | POA: Diagnosis not present

## 2016-10-19 ENCOUNTER — Other Ambulatory Visit: Payer: PPO

## 2016-10-20 ENCOUNTER — Other Ambulatory Visit: Payer: PPO

## 2016-10-21 DIAGNOSIS — M25662 Stiffness of left knee, not elsewhere classified: Secondary | ICD-10-CM | POA: Diagnosis not present

## 2016-10-21 DIAGNOSIS — G8929 Other chronic pain: Secondary | ICD-10-CM | POA: Diagnosis not present

## 2016-10-21 DIAGNOSIS — M6281 Muscle weakness (generalized): Secondary | ICD-10-CM | POA: Diagnosis not present

## 2016-10-21 DIAGNOSIS — M25562 Pain in left knee: Secondary | ICD-10-CM | POA: Diagnosis not present

## 2016-10-21 DIAGNOSIS — Z96652 Presence of left artificial knee joint: Secondary | ICD-10-CM | POA: Diagnosis not present

## 2016-10-23 DIAGNOSIS — M25662 Stiffness of left knee, not elsewhere classified: Secondary | ICD-10-CM | POA: Diagnosis not present

## 2016-10-23 DIAGNOSIS — M25562 Pain in left knee: Secondary | ICD-10-CM | POA: Diagnosis not present

## 2016-10-23 DIAGNOSIS — Z96652 Presence of left artificial knee joint: Secondary | ICD-10-CM | POA: Diagnosis not present

## 2016-10-23 DIAGNOSIS — M6281 Muscle weakness (generalized): Secondary | ICD-10-CM | POA: Diagnosis not present

## 2016-10-23 DIAGNOSIS — G8929 Other chronic pain: Secondary | ICD-10-CM | POA: Diagnosis not present

## 2016-10-26 DIAGNOSIS — M25562 Pain in left knee: Secondary | ICD-10-CM | POA: Diagnosis not present

## 2016-10-26 DIAGNOSIS — M25662 Stiffness of left knee, not elsewhere classified: Secondary | ICD-10-CM | POA: Diagnosis not present

## 2016-10-26 DIAGNOSIS — M6281 Muscle weakness (generalized): Secondary | ICD-10-CM | POA: Diagnosis not present

## 2016-10-26 DIAGNOSIS — G8929 Other chronic pain: Secondary | ICD-10-CM | POA: Diagnosis not present

## 2016-10-26 DIAGNOSIS — Z96652 Presence of left artificial knee joint: Secondary | ICD-10-CM | POA: Diagnosis not present

## 2016-10-27 ENCOUNTER — Ambulatory Visit
Admission: RE | Admit: 2016-10-27 | Discharge: 2016-10-27 | Disposition: A | Discharge status: Home / Self Care | Payer: PPO | Source: Ambulatory Visit | Attending: General Surgery | Admitting: General Surgery

## 2016-10-27 ENCOUNTER — Other Ambulatory Visit: Payer: PPO

## 2016-10-27 DIAGNOSIS — Z853 Personal history of malignant neoplasm of breast: Secondary | ICD-10-CM

## 2016-10-27 DIAGNOSIS — R928 Other abnormal and inconclusive findings on diagnostic imaging of breast: Secondary | ICD-10-CM | POA: Diagnosis not present

## 2016-10-27 HISTORY — DX: Personal history of irradiation: Z92.3

## 2016-10-28 DIAGNOSIS — M25662 Stiffness of left knee, not elsewhere classified: Secondary | ICD-10-CM | POA: Diagnosis not present

## 2016-10-28 DIAGNOSIS — G8929 Other chronic pain: Secondary | ICD-10-CM | POA: Diagnosis not present

## 2016-10-28 DIAGNOSIS — M25562 Pain in left knee: Secondary | ICD-10-CM | POA: Diagnosis not present

## 2016-10-28 DIAGNOSIS — Z96652 Presence of left artificial knee joint: Secondary | ICD-10-CM | POA: Diagnosis not present

## 2016-10-28 DIAGNOSIS — M6281 Muscle weakness (generalized): Secondary | ICD-10-CM | POA: Diagnosis not present

## 2016-10-30 DIAGNOSIS — Z96652 Presence of left artificial knee joint: Secondary | ICD-10-CM | POA: Diagnosis not present

## 2016-11-03 ENCOUNTER — Encounter: Payer: Self-pay | Admitting: General Surgery

## 2016-11-03 ENCOUNTER — Ambulatory Visit (INDEPENDENT_AMBULATORY_CARE_PROVIDER_SITE_OTHER): Payer: PPO | Admitting: General Surgery

## 2016-11-03 VITALS — BP 128/80 | HR 74 | Resp 14 | Ht 67.0 in | Wt 152.0 lb

## 2016-11-03 DIAGNOSIS — Z170 Estrogen receptor positive status [ER+]: Secondary | ICD-10-CM | POA: Diagnosis not present

## 2016-11-03 DIAGNOSIS — C50412 Malignant neoplasm of upper-outer quadrant of left female breast: Secondary | ICD-10-CM | POA: Diagnosis not present

## 2016-11-03 NOTE — Patient Instructions (Signed)
Patient to return for mammogram and breast checks. The patient is aware to call back for any questions or concerns
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
## 2016-11-04 ENCOUNTER — Telehealth: Payer: Self-pay

## 2016-11-04 DIAGNOSIS — Z96659 Presence of unspecified artificial knee joint: Secondary | ICD-10-CM | POA: Diagnosis not present

## 2016-11-04 LAB — CANCER ANTIGEN 27.29: CAN 27.29: 20.2 U/mL (ref 0.0–38.6)

## 2016-11-04 NOTE — Telephone Encounter (Signed)
Notified patient as instructed, patient pleased. Discussed follow-up appointments, patient agrees  

## 2016-11-04 NOTE — Telephone Encounter (Signed)
-----   Message from Christene Lye, MD sent at 11/04/2016  7:38 AM EDT ----- Please inform pt- normal value

## 2016-12-22 ENCOUNTER — Encounter: Payer: Self-pay | Admitting: Family Medicine

## 2016-12-31 ENCOUNTER — Ambulatory Visit: Payer: PPO

## 2017-01-05 ENCOUNTER — Other Ambulatory Visit: Payer: Self-pay

## 2017-01-05 ENCOUNTER — Emergency Department
Admission: EM | Admit: 2017-01-05 | Discharge: 2017-01-05 | Disposition: A | Discharge status: Home / Self Care | Payer: PPO | Attending: Student in an Organized Health Care Education/Training Program | Admitting: Student in an Organized Health Care Education/Training Program

## 2017-01-05 ENCOUNTER — Emergency Department: Payer: PPO

## 2017-01-05 ENCOUNTER — Ambulatory Visit: Payer: Self-pay | Admitting: *Deleted

## 2017-01-05 ENCOUNTER — Encounter: Payer: Self-pay | Admitting: Emergency Medicine

## 2017-01-05 DIAGNOSIS — Z79899 Other long term (current) drug therapy: Secondary | ICD-10-CM | POA: Insufficient documentation

## 2017-01-05 DIAGNOSIS — N183 Chronic kidney disease, stage 3 (moderate): Secondary | ICD-10-CM | POA: Insufficient documentation

## 2017-01-05 DIAGNOSIS — E1122 Type 2 diabetes mellitus with diabetic chronic kidney disease: Secondary | ICD-10-CM | POA: Insufficient documentation

## 2017-01-05 DIAGNOSIS — I10 Essential (primary) hypertension: Secondary | ICD-10-CM

## 2017-01-05 DIAGNOSIS — Z853 Personal history of malignant neoplasm of breast: Secondary | ICD-10-CM | POA: Diagnosis not present

## 2017-01-05 DIAGNOSIS — I129 Hypertensive chronic kidney disease with stage 1 through stage 4 chronic kidney disease, or unspecified chronic kidney disease: Secondary | ICD-10-CM | POA: Insufficient documentation

## 2017-01-05 DIAGNOSIS — R0789 Other chest pain: Secondary | ICD-10-CM | POA: Diagnosis not present

## 2017-01-05 LAB — BASIC METABOLIC PANEL
ANION GAP: 9 (ref 5–15)
BUN: 20 mg/dL (ref 6–20)
CALCIUM: 9.8 mg/dL (ref 8.9–10.3)
CO2: 27 mmol/L (ref 22–32)
Chloride: 104 mmol/L (ref 101–111)
Creatinine, Ser: 1.29 mg/dL — ABNORMAL HIGH (ref 0.44–1.00)
GFR calc Af Amer: 44 mL/min — ABNORMAL LOW (ref >60)
GFR, EST NON AFRICAN AMERICAN: 38 mL/min — AB (ref >60)
GLUCOSE: 120 mg/dL — AB (ref 65–99)
Potassium: 4.8 mmol/L (ref 3.5–5.1)
SODIUM: 140 mmol/L (ref 135–145)

## 2017-01-05 LAB — CBC
HCT: 43.6 % (ref 35.0–47.0)
Hemoglobin: 14.4 g/dL (ref 12.0–16.0)
MCH: 31.1 pg (ref 26.0–34.0)
MCHC: 32.9 g/dL (ref 32.0–36.0)
MCV: 94.6 fL (ref 80.0–100.0)
Platelets: 242 10*3/uL (ref 150–440)
RBC: 4.61 MIL/uL (ref 3.80–5.20)
RDW: 13.1 % (ref 11.5–14.5)
WBC: 9.1 10*3/uL (ref 3.6–11.0)

## 2017-01-05 LAB — TROPONIN I

## 2017-01-05 MED ORDER — AMLODIPINE BESYLATE 5 MG PO TABS
10.0000 mg | ORAL_TABLET | Freq: Once | ORAL | Status: AC
  Administered 2017-01-05: 10 mg via ORAL
  Filled 2017-01-05: qty 2

## 2017-01-05 MED ORDER — AMLODIPINE BESYLATE 5 MG PO TABS
5.0000 mg | ORAL_TABLET | Freq: Every day | ORAL | 0 refills | Status: DC
Start: 2017-01-05 — End: 2017-01-19
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
## 2017-01-05 NOTE — Telephone Encounter (Signed)
  Reason for Disposition . [0] Systolic BP  >= 370 OR Diastolic >= 488  AND [8] having NO cardiac or neurologic symptoms  Answer Assessment - Initial Assessment Questions 1. BLOOD PRESSURE: "What is the blood pressure?" "Did you take at least two measurements 5 minutes apart?"     201/100 2. ONSET: "When did you take your blood pressure?"     1230 3. HOW: "How did you obtain the blood pressure?" (e.g., visiting nurse, automatic home BP monitor)    Home BP monitor 4. HISTORY: "Do you have a history of high blood pressure?"     yes 5. MEDICATIONS: "Are you taking any medications for blood pressure?" "Have you missed any doses recently?"     Losartan(200mg -taken today) and Metoprolol 6. OTHER SYMPTOMS: "Do you have any symptoms?" (e.g., headache, chest pain, blurred vision, difficulty breathing, weakness)     Headache  Protocols used: HIGH BLOOD PRESSURE-A-AH   Called Flow Coordinator, Waynetta at the [facility] to see if pt could be worked in for an appt on today, but none available. Dr. Damita Dunnings recommended, per Earl Lagos that the pt retake her blood pressure again after relaxing for a while and if still >200 to go to the Urgent care and to take her blood pressure cuff with her. Advised pt to take her blood pressure again in approximately 1 1/2 hours and if still >200, to go to the Urgent care and to take her home cuff with her.Pt verbalized understanding.

## 2017-01-05 NOTE — ED Notes (Signed)
Pharmacy called to send the norvasc

## 2017-01-05 NOTE — ED Notes (Signed)
Pt reports that she has high blood sugar and high blood pressure - at home FSBS 276 BP 225/96  Pt c/o headache but denies any other symptoms

## 2017-01-05 NOTE — ED Notes (Signed)
Sent from MD office for elevated BP / and elevated glucose , ambulatory with no distress

## 2017-01-05 NOTE — ED Notes (Signed)
Pharmacy emailed to send norvasc

## 2017-01-05 NOTE — ED Triage Notes (Signed)
Patient reports increasing blood pressure since yesterday. States she called PCP and was told to double dose of losartan which she did without any decrease. Patient says blood pressure continues to rise. Reports mild headache. Denies chest pain, SOB, or dizziness. A&Ox 4. NAD noted.

## 2017-01-07 ENCOUNTER — Encounter: Payer: Self-pay | Admitting: Family Medicine

## 2017-01-07 ENCOUNTER — Ambulatory Visit: Payer: PPO | Admitting: Family Medicine

## 2017-01-07 VITALS — BP 118/74 | HR 60 | Temp 97.9°F | Ht 67.0 in | Wt 153.0 lb

## 2017-01-07 DIAGNOSIS — I10 Essential (primary) hypertension: Secondary | ICD-10-CM | POA: Diagnosis not present

## 2017-01-07 MED ORDER — ONETOUCH ULTRA 2 W/DEVICE KIT: PACK | 0 refills | Status: AC

## 2017-01-07 NOTE — Patient Instructions (Signed)
Please continue taking Norvasc only as needed.  Follow up with Dr. Darnell Level in 2 weeks.
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
## 2017-01-07 NOTE — Assessment & Plan Note (Addendum)
Normotensive with addition of Norvasc.  BP here is 118/74.  She brings in her home cuff- BP is 173/93 on her home cuff.  Advised that her cuff appears to be inaccurate.  She will get a new cuff today. Follow up with PCP in 2 weeks.

## 2017-01-09 ENCOUNTER — Other Ambulatory Visit: Payer: Self-pay | Admitting: Family Medicine

## 2017-01-15 ENCOUNTER — Ambulatory Visit: Payer: Self-pay | Admitting: Family Medicine

## 2017-01-19 ENCOUNTER — Encounter: Payer: Self-pay | Admitting: Family Medicine

## 2017-01-19 ENCOUNTER — Ambulatory Visit: Payer: PPO | Admitting: Family Medicine

## 2017-01-19 VITALS — BP 168/68 | HR 58 | Temp 97.8°F | Wt 154.0 lb

## 2017-01-19 DIAGNOSIS — E1122 Type 2 diabetes mellitus with diabetic chronic kidney disease: Secondary | ICD-10-CM

## 2017-01-19 DIAGNOSIS — E119 Type 2 diabetes mellitus without complications: Secondary | ICD-10-CM | POA: Diagnosis not present

## 2017-01-19 DIAGNOSIS — N183 Chronic kidney disease, stage 3 (moderate): Secondary | ICD-10-CM | POA: Diagnosis not present

## 2017-01-19 DIAGNOSIS — I10 Essential (primary) hypertension: Secondary | ICD-10-CM | POA: Diagnosis not present

## 2017-01-19 MED ORDER — LOSARTAN POTASSIUM 100 MG PO TABS: 100.0000 mg | ORAL_TABLET | Freq: Every day | ORAL | 3 refills | Status: DC

## 2017-01-19 MED ORDER — AMLODIPINE BESYLATE 5 MG PO TABS: 5.0000 mg | ORAL_TABLET | Freq: Every day | ORAL | 3 refills | Status: DC

## 2017-01-19 MED ORDER — ONETOUCH ULTRASOFT LANCETS MISC: 3 refills | Status: DC

## 2017-01-19 MED ORDER — GLUCOSE BLOOD VI STRP: ORAL_STRIP | 3 refills | Status: DC

## 2017-01-19 NOTE — Assessment & Plan Note (Signed)
Overall stable - merits monitoring and better bp control.

## 2017-01-19 NOTE — Assessment & Plan Note (Addendum)
Persistently elevated readings despite new cuff. Reviewed correct way to check blood pressures. She has also stopped aleve use.  Anticipate component of arterial stiffness given wide range in systolic and diastolic.  She has not been regular with amlodipine - will start this daily and call me in 2 wks with updated readings.  Reviewed healthy diet and lifestyle changes to improve blood pressures, DASH diet handout provided

## 2017-01-19 NOTE — Progress Notes (Addendum)
BP (!) 168/68 (BP Location: Right Arm, Cuff Size: Normal)   Pulse (!) 58   Temp 97.8 F (36.6 C) (Oral)   Wt 154 lb (69.9 kg)   SpO2 96%   BMI 24.12 kg/m    CC: 2 wk f/u visit Subjective:    Patient ID: Paige Medina, female    DOB: Dec 09, 1933, 83 y.o.   MRN: [id number]  HPI: Paige Medina is a 83 y.o. female presenting on 01/19/2017 for 2 wk follow-up (from OV with Dr.Aron on 01/07/17 for unstable BP. Says it has not really improved. Pt provided a copy of recent readings.)   Saw Dr Deborra Medina 2 weeks ago - note reviewed. Initially seen at ER with high BP and mild frontal headache. labwork unrevealing as well as EKG and CXR. Norvasc 53m was added to prior regimen of losartan 106mand toprol XL 2563maily. Norvasc use PRN only.   Brings bp log showing range from 114-170/60-70s. She had been taking aleve 220m54mre regularly for joint pains. Stopped aleve since 2 wks.  She bought new BP cuff.  HR 50-60s.   Denies HA, vision changes, CP/tightness, SOB, leg swelling.   Requests Rx for ultra 2 blue test strips and lancets  Relevant past medical, surgical, family and social history reviewed and updated as indicated. Interim medical history since our last visit reviewed. Allergies and medications reviewed and updated. Outpatient Medications Prior to Visit  Medication Sig Dispense Refill  . acetaminophen (TYLENOL 8 HOUR ARTHRITIS PAIN) 650 MG CR tablet Take 1,300 mg by mouth every 8 (eight) hours as needed for pain.    . Ascorbic Acid (VITAMIN C PO) Take 1 tablet by mouth daily as needed (for cold symptoms).     . beta carotene w/minerals (OCUVITE) tablet Take 1 tablet by mouth every other day.    . Blood Glucose Monitoring Suppl (ONE TOUCH ULTRA 2) w/Device KIT Use as directed 1 each 0  . Calcium-Phosphorus-Vitamin D (CITRACAL +D3 PO) Take 1 tablet by mouth daily with breakfast.     . clobetasol (TEMOVATE) 0.05 % external solution Apply 1 application topically 2 (two) times daily as  needed (for rash on scalp).    . clobetasol cream (TEMOVATE) 0.055.59pply 1 application topically 2 (two) times daily. (Patient taking differently: Apply 1 application topically 2 (two) times daily as needed (for vaginal irritation). ) 45 g 1  . Coenzyme Q10 (CO Q-10) 200 MG CAPS Take 200 mg by mouth every morning.     . docusate sodium (COLACE) 100 MG capsule Take 100 mg by mouth at bedtime.    . doMarland Kitchenycycline (ADOXA) 50 MG tablet Take 50 mg by mouth daily as needed.    . esMarland Kitchenradiol (ESTRACE) 0.1 MG/GM vaginal cream Place 1 Applicatorful vaginally 2 (two) times a week. (Patient taking differently: Place 1 Applicatorful vaginally once a week. ) 42.5 g 3  . Krill Oil 500 MG CAPS Take 500 mg by mouth every morning.     . lovastatin (MEVACOR) 40 MG tablet TAKE ONE TABLET BY MOUTH EVERY NIGHT AT BEDTIME 90 tablet 0  . metoprolol succinate (TOPROL-XL) 25 MG 24 hr tablet TAKE 1/2 TABLET BY MOUTH EVERY DAY. WITHA MEAL. (Patient taking differently: TAKE 1/2 TABLET BY MOUTH EVERY DAY. WITHA MEAL, breakfast.) 45 tablet 3  . metroNIDAZOLE (METROCREAM) 0.75 % cream Apply 1 application topically at bedtime as needed. For rosacea.  3  . Probiotic Product (PROBIOTIC DAILY PO) Take 1 capsule by mouth at bedtime.     .[name]  triamcinolone cream (KENALOG) 0.1 % Apply 1 application topically 2 (two) times daily.    . Turmeric 500 MG CAPS Take 500 mg by mouth at bedtime.    . Zinc Sulfate (ZINC 15 PO) Take 15 mg by mouth as needed (with cold symptoms). Reported on 08/16/2015    . amLODipine (NORVASC) 5 MG tablet Take 1 tablet (5 mg total) daily by mouth. 15 tablet 0  . losartan (COZAAR) 100 MG tablet Take 0.5-1 tablets (50-100 mg total) by mouth daily.     No facility-administered medications prior to visit.      Per HPI unless specifically indicated in ROS section below Review of Systems     Objective:    BP (!) 168/68 (BP Location: Right Arm, Cuff Size: Normal)   Pulse (!) 58   Temp 97.8 F (36.6 C) (Oral)   Wt  154 lb (69.9 kg)   SpO2 96%   BMI 24.12 kg/m   Wt Readings from Last 3 Encounters:  01/19/17 154 lb (69.9 kg)  01/07/17 153 lb (69.4 kg)  01/05/17 153 lb (69.4 kg)    Physical Exam  Constitutional: She appears well-developed and well-nourished. No distress.  HENT:  Mouth/Throat: Oropharynx is clear and moist. No oropharyngeal exudate.  Cardiovascular: Normal rate, regular rhythm and intact distal pulses.  Murmur (3/6 systolic) heard. Pulmonary/Chest: Effort normal and breath sounds normal. No respiratory distress. She has no wheezes. She has no rales.  Musculoskeletal: She exhibits no edema.  Skin: Skin is warm and dry. No rash noted.  Psychiatric: She has a normal mood and affect.  Nursing note and vitals reviewed.  Results for orders placed or performed during the hospital encounter of 54/62/70  Basic metabolic panel  Result Value Ref Range   Sodium 140 135 - 145 mmol/L   Potassium 4.8 3.5 - 5.1 mmol/L   Chloride 104 101 - 111 mmol/L   CO2 27 22 - 32 mmol/L   Glucose, Bld 120 (H) 65 - 99 mg/dL   BUN 20 6 - 20 mg/dL   Creatinine, Ser 1.29 (H) 0.44 - 1.00 mg/dL   Calcium 9.8 8.9 - 10.3 mg/dL   GFR calc non Af Amer 38 (L) >60 mL/min   GFR calc Af Amer 44 (L) >60 mL/min   Anion gap 9 5 - 15  CBC  Result Value Ref Range   WBC 9.1 3.6 - 11.0 K/uL   RBC 4.61 3.80 - 5.20 MIL/uL   Hemoglobin 14.4 12.0 - 16.0 g/dL   HCT 43.6 35.0 - 47.0 %   MCV 94.6 80.0 - 100.0 fL   MCH 31.1 26.0 - 34.0 pg   MCHC 32.9 32.0 - 36.0 g/dL   RDW 13.1 11.5 - 14.5 %   Platelets 242 150 - 440 K/uL  Troponin I  Result Value Ref Range   Troponin I <0.03 <0.03 ng/mL      Assessment & Plan:   Problem List Items Addressed This Visit    CKD stage 3 due to type 2 diabetes mellitus (HCC)    Overall stable - merits monitoring and better bp control.      Relevant Medications   losartan (COZAAR) 100 MG tablet   Diabetes mellitus without complication (HCC)   Relevant Medications   losartan (COZAAR)  100 MG tablet   Hypertension - Primary    Persistently elevated readings despite new cuff. Reviewed correct way to check blood pressures. She has also stopped aleve use.  Anticipate component of arterial stiffness given wide  range in systolic and diastolic.  She has not been regular with amlodipine - will start this daily and call me in 2 wks with updated readings.  Reviewed healthy diet and lifestyle changes to improve blood pressures, DASH diet handout provided      Relevant Medications   amLODipine (NORVASC) 5 MG tablet   losartan (COZAAR) 100 MG tablet       Follow up plan: Return if symptoms worsen or fail to improve.  [name], MD

## 2017-01-19 NOTE — Patient Instructions (Addendum)
Continue current regimen. Add norvasc 5mg  daily. Call me with readings in 2 weeks.  Your goal blood pressure is <140/90.  Work on low salt/sodium diet - goal <1.5gm (1,500mg ) per day. Eat a diet high in fruits/vegetables and whole grains.  Look into mediterranean and DASH diet. Goal activity is 133min/wk of moderate intensity exercise.  This can be split into 30 minute chunks.  If you are not at this level, you can start with smaller 10-15 min increments and slowly build up activity. Look at [place].org for more resources   DASH Eating Plan DASH stands for "Dietary Approaches to Stop Hypertension." The DASH eating plan is a healthy eating plan that has been shown to reduce high blood pressure (hypertension). It may also reduce your risk for type 2 diabetes, heart disease, and stroke. The DASH eating plan may also help with weight loss. What are tips for following this plan? General guidelines  Avoid eating more than 2,300 mg (milligrams) of salt (sodium) a day. If you have hypertension, you may need to reduce your sodium intake to 1,500 mg a day.  Limit alcohol intake to no more than 1 drink a day for nonpregnant women and 2 drinks a day for men. One drink equals 12 oz of beer, 5 oz of wine, or 1 oz of hard liquor.  Work with your health care provider to maintain a healthy body weight or to lose weight. Ask what an ideal weight is for you.  Get at least 30 minutes of exercise that causes your heart to beat faster (aerobic exercise) most days of the week. Activities may include walking, swimming, or biking.  Work with your health care provider or diet and nutrition specialist (dietitian) to adjust your eating plan to your individual calorie needs. Reading food labels  Check food labels for the amount of sodium per serving. Choose foods with less than 5 percent of the Daily Value of sodium. Generally, foods with less than 300 mg of sodium per serving fit into this eating plan.  To find  whole grains, look for the word "whole" as the first word in the ingredient list. Shopping  Buy products labeled as "low-sodium" or "no salt added."  Buy fresh foods. Avoid canned foods and premade or frozen meals. Cooking  Avoid adding salt when cooking. Use salt-free seasonings or herbs instead of table salt or sea salt. Check with your health care provider or pharmacist before using salt substitutes.  Do not fry foods. Cook foods using healthy methods such as baking, boiling, grilling, and broiling instead.  Cook with heart-healthy oils, such as olive, canola, soybean, or sunflower oil. Meal planning   Eat a balanced diet that includes: ? 5 or more servings of fruits and vegetables each day. At each meal, try to fill half of your plate with fruits and vegetables. ? Up to 6-8 servings of whole grains each day. ? Less than 6 oz of lean meat, poultry, or fish each day. A 3-oz serving of meat is about the same size as a deck of cards. One egg equals 1 oz. ? 2 servings of low-fat dairy each day. ? A serving of nuts, seeds, or beans 5 times each week. ? Heart-healthy fats. Healthy fats called Omega-3 fatty acids are found in foods such as flaxseeds and coldwater fish, like sardines, salmon, and mackerel.  Limit how much you eat of the following: ? Canned or prepackaged foods. ? Food that is high in trans fat, such as fried foods. ?  Food that is high in saturated fat, such as fatty meat. ? Sweets, desserts, sugary drinks, and other foods with added sugar. ? Full-fat dairy products.  Do not salt foods before eating.  Try to eat at least 2 vegetarian meals each week.  Eat more home-cooked food and less restaurant, buffet, and fast food.  When eating at a restaurant, ask that your food be prepared with less salt or no salt, if possible. What foods are recommended? The items listed may not be a complete list. Talk with your dietitian about what dietary choices are best for  you. Grains Whole-grain or whole-wheat bread. Whole-grain or whole-wheat pasta. Brown rice. [place]. Bulgur. Whole-grain and low-sodium cereals. Pita bread. Low-fat, low-sodium crackers. Whole-wheat flour tortillas. Vegetables Fresh or frozen vegetables (raw, steamed, roasted, or grilled). Low-sodium or reduced-sodium tomato and vegetable juice. Low-sodium or reduced-sodium tomato sauce and tomato paste. Low-sodium or reduced-sodium canned vegetables. Fruits All fresh, dried, or frozen fruit. Canned fruit in natural juice (without added sugar). Meat and other protein foods Skinless chicken or [place]. Ground chicken or [place]. Pork with fat trimmed off. Fish and seafood. Egg whites. Dried beans, peas, or lentils. Unsalted nuts, nut butters, and seeds. Unsalted canned beans. Lean cuts of beef with fat trimmed off. Low-sodium, lean deli meat. Dairy Low-fat (1%) or fat-free (skim) milk. Fat-free, low-fat, or reduced-fat cheeses. Nonfat, low-sodium ricotta or cottage cheese. Low-fat or nonfat yogurt. Low-fat, low-sodium cheese. Fats and oils Soft margarine without trans fats. Vegetable oil. Low-fat, reduced-fat, or light mayonnaise and salad dressings (reduced-sodium). Canola, safflower, olive, soybean, and sunflower oils. Avocado. Seasoning and other foods Herbs. Spices. Seasoning mixes without salt. Unsalted popcorn and pretzels. Fat-free sweets. What foods are not recommended? The items listed may not be a complete list. Talk with your dietitian about what dietary choices are best for you. Grains Baked goods made with fat, such as croissants, muffins, or some breads. Dry pasta or rice meal packs. Vegetables Creamed or fried vegetables. Vegetables in a cheese sauce. Regular canned vegetables (not low-sodium or reduced-sodium). Regular canned tomato sauce and paste (not low-sodium or reduced-sodium). Regular tomato and vegetable juice (not low-sodium or reduced-sodium). Angie Fava.  Olives. Fruits Canned fruit in a light or heavy syrup. Fried fruit. Fruit in cream or butter sauce. Meat and other protein foods Fatty cuts of meat. Ribs. Fried meat. Berniece Salines. Sausage. Bologna and other processed lunch meats. Salami. Fatback. Hotdogs. Bratwurst. Salted nuts and seeds. Canned beans with added salt. Canned or smoked fish. Whole eggs or egg yolks. Chicken or [place] with skin. Dairy Whole or 2% milk, cream, and half-and-half. Whole or full-fat cream cheese. Whole-fat or sweetened yogurt. Full-fat cheese. Nondairy creamers. Whipped toppings. Processed cheese and cheese spreads. Fats and oils Butter. Stick margarine. Lard. Shortening. Ghee. Bacon fat. Tropical oils, such as coconut, palm kernel, or palm oil. Seasoning and other foods Salted popcorn and pretzels. Onion salt, garlic salt, seasoned salt, table salt, and sea salt. Worcestershire sauce. Tartar sauce. Barbecue sauce. Teriyaki sauce. Soy sauce, including reduced-sodium. Steak sauce. Canned and packaged gravies. Fish sauce. Oyster sauce. Cocktail sauce. Horseradish that you find on the shelf. Ketchup. Mustard. Meat flavorings and tenderizers. Bouillon cubes. Hot sauce and Tabasco sauce. Premade or packaged marinades. Premade or packaged taco seasonings. Relishes. Regular salad dressings. Where to find more information:  National Heart, Lung, and [place]: https://wilson-eaton.com/  American Heart Association: www.heart.org Summary  The DASH eating plan is a healthy eating plan that has been shown to reduce high blood  pressure (hypertension). It may also reduce your risk for type 2 diabetes, heart disease, and stroke.  With the DASH eating plan, you should limit salt (sodium) intake to 2,300 mg a day. If you have hypertension, you may need to reduce your sodium intake to 1,500 mg a day.  When on the DASH eating plan, aim to eat more fresh fruits and vegetables, whole grains, lean proteins, low-fat dairy, and heart-healthy  fats.  Work with your health care provider or diet and nutrition specialist (dietitian) to adjust your eating plan to your individual calorie needs. This information is not intended to replace advice given to you by your health care provider. Make sure you discuss any questions you have with your health care provider. Document Released: 02/05/2011 Document Revised: 02/10/2016 Document Reviewed: 02/10/2016 Elsevier Interactive Patient Education  2017 [employer].

## 2017-02-03 ENCOUNTER — Telehealth: Payer: Self-pay | Admitting: Family Medicine

## 2017-02-03 NOTE — Telephone Encounter (Signed)
Wonderful - seems like she is doing well. Continue current regimen.

## 2017-02-03 NOTE — Telephone Encounter (Signed)
Copied from [place] [id number]. Topic: Quick Communication - See Telephone Encounter >> Feb 03, 2017 10:41 AM Arletha Grippe wrote: Reason for CRM: pt was put on norvasc and Dr Darnell Level wanted readings Calling with bp reading  High is 146/71 Low 119/62 Seems to doing fine.  Cb number is [phone]

## 2017-02-03 NOTE — Telephone Encounter (Signed)
Spoke with pt relaying message per Dr. Darnell Level.  Pt say ok.

## 2017-03-02 HISTORY — PX: SKIN CANCER EXCISION: SHX779

## 2017-03-02 HISTORY — PX: MASTECTOMY: SHX3

## 2017-03-04 DIAGNOSIS — L821 Other seborrheic keratosis: Secondary | ICD-10-CM | POA: Diagnosis not present

## 2017-03-04 DIAGNOSIS — C44619 Basal cell carcinoma of skin of left upper limb, including shoulder: Secondary | ICD-10-CM | POA: Diagnosis not present

## 2017-03-04 DIAGNOSIS — D485 Neoplasm of uncertain behavior of skin: Secondary | ICD-10-CM | POA: Diagnosis not present

## 2017-03-04 DIAGNOSIS — Z1283 Encounter for screening for malignant neoplasm of skin: Secondary | ICD-10-CM | POA: Diagnosis not present

## 2017-03-04 DIAGNOSIS — Z85828 Personal history of other malignant neoplasm of skin: Secondary | ICD-10-CM | POA: Diagnosis not present

## 2017-03-04 DIAGNOSIS — L719 Rosacea, unspecified: Secondary | ICD-10-CM | POA: Diagnosis not present

## 2017-03-04 DIAGNOSIS — L820 Inflamed seborrheic keratosis: Secondary | ICD-10-CM | POA: Diagnosis not present

## 2017-03-04 DIAGNOSIS — L905 Scar conditions and fibrosis of skin: Secondary | ICD-10-CM | POA: Diagnosis not present

## 2017-03-04 DIAGNOSIS — L280 Lichen simplex chronicus: Secondary | ICD-10-CM | POA: Diagnosis not present

## 2017-03-22 DIAGNOSIS — M7582 Other shoulder lesions, left shoulder: Secondary | ICD-10-CM | POA: Diagnosis not present

## 2017-03-22 DIAGNOSIS — M1612 Unilateral primary osteoarthritis, left hip: Secondary | ICD-10-CM | POA: Diagnosis not present

## 2017-03-22 DIAGNOSIS — M25512 Pain in left shoulder: Secondary | ICD-10-CM | POA: Diagnosis not present

## 2017-03-22 DIAGNOSIS — M25552 Pain in left hip: Secondary | ICD-10-CM | POA: Diagnosis not present

## 2017-03-22 DIAGNOSIS — M7522 Bicipital tendinitis, left shoulder: Secondary | ICD-10-CM | POA: Diagnosis not present

## 2017-03-23 DIAGNOSIS — C44619 Basal cell carcinoma of skin of left upper limb, including shoulder: Secondary | ICD-10-CM | POA: Diagnosis not present

## 2017-03-23 DIAGNOSIS — L578 Other skin changes due to chronic exposure to nonionizing radiation: Secondary | ICD-10-CM | POA: Diagnosis not present

## 2017-03-29 DIAGNOSIS — M1612 Unilateral primary osteoarthritis, left hip: Secondary | ICD-10-CM | POA: Diagnosis not present

## 2017-04-05 ENCOUNTER — Other Ambulatory Visit: Payer: Self-pay | Admitting: Family Medicine

## 2017-04-08 ENCOUNTER — Ambulatory Visit (INDEPENDENT_AMBULATORY_CARE_PROVIDER_SITE_OTHER): Payer: PPO | Admitting: Family Medicine

## 2017-04-08 ENCOUNTER — Encounter: Payer: Self-pay | Admitting: Family Medicine

## 2017-04-08 VITALS — BP 122/62 | HR 60 | Temp 98.2°F | Ht 65.5 in | Wt 149.0 lb

## 2017-04-08 DIAGNOSIS — N183 Chronic kidney disease, stage 3 (moderate): Secondary | ICD-10-CM | POA: Diagnosis not present

## 2017-04-08 DIAGNOSIS — E1122 Type 2 diabetes mellitus with diabetic chronic kidney disease: Secondary | ICD-10-CM | POA: Diagnosis not present

## 2017-04-08 DIAGNOSIS — I10 Essential (primary) hypertension: Secondary | ICD-10-CM

## 2017-04-08 DIAGNOSIS — E119 Type 2 diabetes mellitus without complications: Secondary | ICD-10-CM | POA: Diagnosis not present

## 2017-04-08 LAB — RENAL FUNCTION PANEL
ALBUMIN: 4.2 g/dL (ref 3.5–5.2)
BUN: 26 mg/dL — AB (ref 6–23)
CO2: 30 meq/L (ref 19–32)
CREATININE: 1.07 mg/dL (ref 0.40–1.20)
Calcium: 9.4 mg/dL (ref 8.4–10.5)
Chloride: 103 mEq/L (ref 96–112)
GFR: 52.19 mL/min — ABNORMAL LOW (ref >60.00)
Glucose, Bld: 131 mg/dL — ABNORMAL HIGH (ref 70–99)
Phosphorus: 3.5 mg/dL (ref 2.3–4.6)
Potassium: 4.4 mEq/L (ref 3.5–5.1)
Sodium: 140 mEq/L (ref 135–145)

## 2017-04-08 NOTE — Assessment & Plan Note (Signed)
Diet controlled. She just had 2 steroid injections into knee - requests we not check A1c today.

## 2017-04-08 NOTE — Assessment & Plan Note (Signed)
Chronic, improved readings on current regimen.  Endorses some bradycardia and possible am lightheadedness that may be related - suggested trial off beta blocker.  norvasc may be worsening constipation - consider change to diuretic.  Continue current regimen for now.

## 2017-04-08 NOTE — Assessment & Plan Note (Signed)
Update renal panel today.  

## 2017-04-08 NOTE — Patient Instructions (Addendum)
Continue current medicines.  Trial off metoprolol to see if it will help with dizziness.  We may add water pill if blood pressures staying elevated.  Labs today.  Return as needed or in 6 months for wellness visit

## 2017-04-08 NOTE — Progress Notes (Signed)
BP 122/62 (BP Location: Left Arm, Patient Position: Sitting, Cuff Size: Normal)   Pulse 60   Temp 98.2 F (36.8 C) (Oral)   Ht 5' 5.5" (1.664 m)   Wt 149 lb (67.6 kg)   SpO2 99%   BMI 24.42 kg/m    CC: 3 mo f/u visit Subjective:    Patient ID: Paige Medina, female    DOB: August 05, 1933, 84 y.o.   MRN: [id number]  HPI: Paige Medina is a 84 y.o. female presenting on 04/08/2017 for Hypertension (Here for follow-up due to addition of amlodipine. Pt provided recent home BP readings)   HTN - Compliant with current antihypertensive regimen of amlodipine 54m daily, losartan 1034mdaily, toprol xl 1/2 of 2547maily. Does check blood pressures at home and brings log: 119-161/60-70s, averaging 130s/60s. HR 50-60s. She does have some dizziness in the mornings. Denies HA, vision changes, CP/tightness, SOB, leg swelling.    Amlodipine may be causing some constipation. She is using miralax, stool softener, probiotic. Having a stool every other day.   Upcoming trip to [place] Relevant past medical, surgical, family and social history reviewed and updated as indicated. Interim medical history since our last visit reviewed. Allergies and medications reviewed and updated. Outpatient Medications Prior to Visit  Medication Sig Dispense Refill  . acetaminophen (TYLENOL 8 HOUR ARTHRITIS PAIN) 650 MG CR tablet Take 1,300 mg by mouth every 8 (eight) hours as needed for pain.    . aMarland KitchenLODipine (NORVASC) 5 MG tablet Take 1 tablet (5 mg total) by mouth daily. 30 tablet 3  . Ascorbic Acid (VITAMIN C PO) Take 1 tablet by mouth daily as needed (for cold symptoms).     . beta carotene w/minerals (OCUVITE) tablet Take 1 tablet by mouth every other day.    . Blood Glucose Monitoring Suppl (ONE TOUCH ULTRA 2) w/Device KIT Use as directed 1 each 0  . Calcium-Phosphorus-Vitamin D (CITRACAL +D3 PO) Take 1 tablet by mouth daily with breakfast.     . clobetasol (TEMOVATE) 0.05 % external solution Apply 1  application topically 2 (two) times daily as needed (for rash on scalp).    . clobetasol cream (TEMOVATE) 0.02.59Apply 1 application topically 2 (two) times daily. (Patient taking differently: Apply 1 application topically 2 (two) times daily as needed (for vaginal irritation). ) 45 g 1  . Coenzyme Q10 (CO Q-10) 200 MG CAPS Take 200 mg by mouth every morning.     . docusate sodium (COLACE) 100 MG capsule Take 100 mg by mouth at bedtime.    . dMarland Kitchenxycycline (ADOXA) 50 MG tablet Take 50 mg by mouth daily as needed.    . eMarland Kitchentradiol (ESTRACE) 0.1 MG/GM vaginal cream Place 1 Applicatorful vaginally 2 (two) times a week. (Patient taking differently: Place 1 Applicatorful vaginally once a week. ) 42.5 g 3  . glucose blood test strip Use as instructed (ultrablue 2 strips) E11.9 100 each 3  . Krill Oil 500 MG CAPS Take 500 mg by mouth every morning.     . Lancets (ONETOUCH ULTRASOFT) lancets Use as instructed E11.9 100 each 3  . losartan (COZAAR) 100 MG tablet Take 1 tablet (100 mg total) by mouth daily. 90 tablet 3  . lovastatin (MEVACOR) 40 MG tablet TAKE ONE TABLET BY MOUTH EVERY NIGHT AT BEDTIME 90 tablet 2  . metoprolol succinate (TOPROL-XL) 25 MG 24 hr tablet TAKE 1/2 TABLET BY MOUTH EVERY DAY. WITHA MEAL. (Patient taking differently: TAKE 1/2 TABLET BY MOUTH  EVERY DAY. WITHA MEAL, breakfast.) 45 tablet 3  . metroNIDAZOLE (METROCREAM) 0.75 % cream Apply 1 application topically at bedtime as needed. For rosacea.  3  . naproxen sodium (ALEVE) 220 MG tablet Take 220 mg by mouth daily as needed.    . Probiotic Product (PROBIOTIC DAILY PO) Take 1 capsule by mouth at bedtime.     . triamcinolone cream (KENALOG) 0.1 % Apply 1 application topically 2 (two) times daily.    . Turmeric 500 MG CAPS Take 500 mg by mouth at bedtime.    . Zinc Sulfate (ZINC 15 PO) Take 15 mg by mouth as needed (with cold symptoms). Reported on 08/16/2015     No facility-administered medications prior to visit.      Per HPI unless  specifically indicated in ROS section below Review of Systems     Objective:    BP 122/62 (BP Location: Left Arm, Patient Position: Sitting, Cuff Size: Normal)   Pulse 60   Temp 98.2 F (36.8 C) (Oral)   Ht 5' 5.5" (1.664 m)   Wt 149 lb (67.6 kg)   SpO2 99%   BMI 24.42 kg/m   Wt Readings from Last 3 Encounters:  04/08/17 149 lb (67.6 kg)  01/19/17 154 lb (69.9 kg)  01/07/17 153 lb (69.4 kg)    Physical Exam  Constitutional: She appears well-developed and well-nourished. No distress.  HENT:  Head: Normocephalic and atraumatic.  Right Ear: External ear normal.  Left Ear: External ear normal.  Nose: Nose normal.  Mouth/Throat: Oropharynx is clear and moist. No oropharyngeal exudate.  Eyes: Conjunctivae and EOM are normal. Pupils are equal, round, and reactive to light. No scleral icterus.  Neck: Normal range of motion. Neck supple.  Cardiovascular: Normal rate, regular rhythm, normal heart sounds and intact distal pulses.  No murmur heard. Pulmonary/Chest: Effort normal and breath sounds normal. No respiratory distress. She has no wheezes. She has no rales.  Musculoskeletal: She exhibits no edema.  See HPI for foot exam if done  Lymphadenopathy:    She has no cervical adenopathy.  Skin: Skin is warm and dry. No rash noted.  Psychiatric: She has a normal mood and affect.  Nursing note and vitals reviewed.  Results for orders placed or performed during the hospital encounter of 63/01/60  Basic metabolic panel  Result Value Ref Range   Sodium 140 135 - 145 mmol/L   Potassium 4.8 3.5 - 5.1 mmol/L   Chloride 104 101 - 111 mmol/L   CO2 27 22 - 32 mmol/L   Glucose, Bld 120 (H) 65 - 99 mg/dL   BUN 20 6 - 20 mg/dL   Creatinine, Ser 1.29 (H) 0.44 - 1.00 mg/dL   Calcium 9.8 8.9 - 10.3 mg/dL   GFR calc non Af Amer 38 (L) >60 mL/min   GFR calc Af Amer 44 (L) >60 mL/min   Anion gap 9 5 - 15  CBC  Result Value Ref Range   WBC 9.1 3.6 - 11.0 K/uL   RBC 4.61 3.80 - 5.20 MIL/uL     Hemoglobin 14.4 12.0 - 16.0 g/dL   HCT 43.6 35.0 - 47.0 %   MCV 94.6 80.0 - 100.0 fL   MCH 31.1 26.0 - 34.0 pg   MCHC 32.9 32.0 - 36.0 g/dL   RDW 13.1 11.5 - 14.5 %   Platelets 242 150 - 440 K/uL  Troponin I  Result Value Ref Range   Troponin I <0.03 <0.03 ng/mL   Lab Results  Component Value Date   HGBA1C 6.6 (H) 10/05/2016       Assessment & Plan:   Problem List Items Addressed This Visit    CKD stage 3 due to type 2 diabetes mellitus ([place])    Update renal panel today.       Relevant Orders   Renal function panel   Diabetes mellitus without complication ([place])    Diet controlled. She just had 2 steroid injections into knee - requests we not check A1c today.      Hypertension - Primary    Chronic, improved readings on current regimen.  Endorses some bradycardia and possible am lightheadedness that may be related - suggested trial off beta blocker.  norvasc may be worsening constipation - consider change to diuretic.  Continue current regimen for now.           Follow up plan: Return in about 6 months (around 10/06/2017) for annual exam, prior fasting for blood work, medicare wellness visit.  [name], MD

## 2017-04-13 ENCOUNTER — Ambulatory Visit: Payer: PPO | Admitting: Family Medicine

## 2017-05-03 DIAGNOSIS — E119 Type 2 diabetes mellitus without complications: Secondary | ICD-10-CM | POA: Diagnosis not present

## 2017-05-03 LAB — HM DIABETES EYE EXAM

## 2017-05-05 ENCOUNTER — Encounter: Payer: Self-pay | Admitting: Family Medicine

## 2017-05-07 ENCOUNTER — Other Ambulatory Visit: Payer: Self-pay | Admitting: Surgery

## 2017-05-07 DIAGNOSIS — M47816 Spondylosis without myelopathy or radiculopathy, lumbar region: Secondary | ICD-10-CM

## 2017-05-11 ENCOUNTER — Telehealth: Payer: Self-pay | Admitting: Family Medicine

## 2017-05-11 NOTE — Telephone Encounter (Signed)
Copied from [place]. Topic: General - Other >> May 11, 2017  2:17 PM Darl Householder, RMA wrote: Reason for CRM: Medication refill request for amLODipine (NORVASC) 5 MG to be sent to [facility] [address]

## 2017-05-12 ENCOUNTER — Other Ambulatory Visit: Payer: Self-pay

## 2017-05-12 MED ORDER — AMLODIPINE BESYLATE 5 MG PO TABS: 5.0000 mg | ORAL_TABLET | Freq: Every day | ORAL | 5 refills | Status: DC

## 2017-05-18 ENCOUNTER — Ambulatory Visit
Admission: RE | Admit: 2017-05-18 | Discharge: 2017-05-18 | Disposition: A | Discharge status: Home / Self Care | Payer: PPO | Source: Ambulatory Visit | Attending: Surgery | Admitting: Surgery

## 2017-05-18 DIAGNOSIS — M48061 Spinal stenosis, lumbar region without neurogenic claudication: Secondary | ICD-10-CM | POA: Diagnosis not present

## 2017-05-18 DIAGNOSIS — M47816 Spondylosis without myelopathy or radiculopathy, lumbar region: Secondary | ICD-10-CM | POA: Diagnosis not present

## 2017-05-18 DIAGNOSIS — M5126 Other intervertebral disc displacement, lumbar region: Secondary | ICD-10-CM | POA: Insufficient documentation

## 2017-05-18 DIAGNOSIS — M8938 Hypertrophy of bone, other site: Secondary | ICD-10-CM | POA: Diagnosis not present

## 2017-05-18 DIAGNOSIS — M545 Low back pain: Secondary | ICD-10-CM | POA: Diagnosis not present

## 2017-05-18 DIAGNOSIS — M5127 Other intervertebral disc displacement, lumbosacral region: Secondary | ICD-10-CM | POA: Insufficient documentation

## 2017-05-26 DIAGNOSIS — N289 Disorder of kidney and ureter, unspecified: Secondary | ICD-10-CM | POA: Diagnosis not present

## 2017-05-26 DIAGNOSIS — M4727 Other spondylosis with radiculopathy, lumbosacral region: Secondary | ICD-10-CM | POA: Diagnosis not present

## 2017-05-28 ENCOUNTER — Other Ambulatory Visit: Payer: Self-pay | Admitting: Student

## 2017-05-28 DIAGNOSIS — M4727 Other spondylosis with radiculopathy, lumbosacral region: Secondary | ICD-10-CM

## 2017-06-03 ENCOUNTER — Ambulatory Visit
Admission: RE | Admit: 2017-06-03 | Discharge: 2017-06-03 | Disposition: A | Discharge status: Home / Self Care | Payer: PPO | Source: Ambulatory Visit | Attending: Student | Admitting: Student

## 2017-06-03 DIAGNOSIS — M4727 Other spondylosis with radiculopathy, lumbosacral region: Secondary | ICD-10-CM

## 2017-06-03 DIAGNOSIS — M48061 Spinal stenosis, lumbar region without neurogenic claudication: Secondary | ICD-10-CM | POA: Diagnosis not present

## 2017-06-03 MED ORDER — METHYLPREDNISOLONE ACETATE 40 MG/ML INJ SUSP (RADIOLOG
120.0000 mg | Freq: Once | INTRAMUSCULAR | Status: AC
  Administered 2017-06-03: 120 mg via EPIDURAL

## 2017-06-03 MED ORDER — IOPAMIDOL (ISOVUE-M 200) INJECTION 41%
1.0000 mL | Freq: Once | INTRAMUSCULAR | Status: AC
  Administered 2017-06-03: 1 mL via EPIDURAL

## 2017-06-03 NOTE — Discharge Instructions (Signed)

## 2017-06-24 DIAGNOSIS — M5127 Other intervertebral disc displacement, lumbosacral region: Secondary | ICD-10-CM | POA: Diagnosis not present

## 2017-06-24 DIAGNOSIS — M5441 Lumbago with sciatica, right side: Secondary | ICD-10-CM | POA: Diagnosis not present

## 2017-06-24 DIAGNOSIS — M7918 Myalgia, other site: Secondary | ICD-10-CM | POA: Diagnosis not present

## 2017-06-24 DIAGNOSIS — M9903 Segmental and somatic dysfunction of lumbar region: Secondary | ICD-10-CM | POA: Diagnosis not present

## 2017-06-24 DIAGNOSIS — M9904 Segmental and somatic dysfunction of sacral region: Secondary | ICD-10-CM | POA: Diagnosis not present

## 2017-06-24 DIAGNOSIS — M5442 Lumbago with sciatica, left side: Secondary | ICD-10-CM | POA: Diagnosis not present

## 2017-06-24 DIAGNOSIS — M5126 Other intervertebral disc displacement, lumbar region: Secondary | ICD-10-CM | POA: Diagnosis not present

## 2017-06-24 DIAGNOSIS — M5137 Other intervertebral disc degeneration, lumbosacral region: Secondary | ICD-10-CM | POA: Diagnosis not present

## 2017-06-24 DIAGNOSIS — M5136 Other intervertebral disc degeneration, lumbar region: Secondary | ICD-10-CM | POA: Diagnosis not present

## 2017-06-24 DIAGNOSIS — M48061 Spinal stenosis, lumbar region without neurogenic claudication: Secondary | ICD-10-CM | POA: Diagnosis not present

## 2017-06-28 DIAGNOSIS — M5126 Other intervertebral disc displacement, lumbar region: Secondary | ICD-10-CM | POA: Diagnosis not present

## 2017-06-28 DIAGNOSIS — M5127 Other intervertebral disc displacement, lumbosacral region: Secondary | ICD-10-CM | POA: Diagnosis not present

## 2017-06-28 DIAGNOSIS — M48061 Spinal stenosis, lumbar region without neurogenic claudication: Secondary | ICD-10-CM | POA: Diagnosis not present

## 2017-06-28 DIAGNOSIS — M9903 Segmental and somatic dysfunction of lumbar region: Secondary | ICD-10-CM | POA: Diagnosis not present

## 2017-06-28 DIAGNOSIS — M5137 Other intervertebral disc degeneration, lumbosacral region: Secondary | ICD-10-CM | POA: Diagnosis not present

## 2017-06-28 DIAGNOSIS — M7918 Myalgia, other site: Secondary | ICD-10-CM | POA: Diagnosis not present

## 2017-06-28 DIAGNOSIS — M9904 Segmental and somatic dysfunction of sacral region: Secondary | ICD-10-CM | POA: Diagnosis not present

## 2017-06-28 DIAGNOSIS — M5441 Lumbago with sciatica, right side: Secondary | ICD-10-CM | POA: Diagnosis not present

## 2017-06-28 DIAGNOSIS — M5442 Lumbago with sciatica, left side: Secondary | ICD-10-CM | POA: Diagnosis not present

## 2017-06-28 DIAGNOSIS — M5136 Other intervertebral disc degeneration, lumbar region: Secondary | ICD-10-CM | POA: Diagnosis not present

## 2017-06-30 DIAGNOSIS — M5442 Lumbago with sciatica, left side: Secondary | ICD-10-CM | POA: Diagnosis not present

## 2017-06-30 DIAGNOSIS — M5137 Other intervertebral disc degeneration, lumbosacral region: Secondary | ICD-10-CM | POA: Diagnosis not present

## 2017-06-30 DIAGNOSIS — M48061 Spinal stenosis, lumbar region without neurogenic claudication: Secondary | ICD-10-CM | POA: Diagnosis not present

## 2017-06-30 DIAGNOSIS — M9903 Segmental and somatic dysfunction of lumbar region: Secondary | ICD-10-CM | POA: Diagnosis not present

## 2017-06-30 DIAGNOSIS — M5126 Other intervertebral disc displacement, lumbar region: Secondary | ICD-10-CM | POA: Diagnosis not present

## 2017-06-30 DIAGNOSIS — M5127 Other intervertebral disc displacement, lumbosacral region: Secondary | ICD-10-CM | POA: Diagnosis not present

## 2017-06-30 DIAGNOSIS — M5136 Other intervertebral disc degeneration, lumbar region: Secondary | ICD-10-CM | POA: Diagnosis not present

## 2017-06-30 DIAGNOSIS — M9904 Segmental and somatic dysfunction of sacral region: Secondary | ICD-10-CM | POA: Diagnosis not present

## 2017-06-30 DIAGNOSIS — M5441 Lumbago with sciatica, right side: Secondary | ICD-10-CM | POA: Diagnosis not present

## 2017-06-30 DIAGNOSIS — M7918 Myalgia, other site: Secondary | ICD-10-CM | POA: Diagnosis not present

## 2017-07-01 DIAGNOSIS — M5136 Other intervertebral disc degeneration, lumbar region: Secondary | ICD-10-CM | POA: Diagnosis not present

## 2017-07-01 DIAGNOSIS — M5126 Other intervertebral disc displacement, lumbar region: Secondary | ICD-10-CM | POA: Diagnosis not present

## 2017-07-01 DIAGNOSIS — M5127 Other intervertebral disc displacement, lumbosacral region: Secondary | ICD-10-CM | POA: Diagnosis not present

## 2017-07-01 DIAGNOSIS — M5137 Other intervertebral disc degeneration, lumbosacral region: Secondary | ICD-10-CM | POA: Diagnosis not present

## 2017-07-01 DIAGNOSIS — M9904 Segmental and somatic dysfunction of sacral region: Secondary | ICD-10-CM | POA: Diagnosis not present

## 2017-07-01 DIAGNOSIS — M7918 Myalgia, other site: Secondary | ICD-10-CM | POA: Diagnosis not present

## 2017-07-01 DIAGNOSIS — M5442 Lumbago with sciatica, left side: Secondary | ICD-10-CM | POA: Diagnosis not present

## 2017-07-01 DIAGNOSIS — M9903 Segmental and somatic dysfunction of lumbar region: Secondary | ICD-10-CM | POA: Diagnosis not present

## 2017-07-01 DIAGNOSIS — M48061 Spinal stenosis, lumbar region without neurogenic claudication: Secondary | ICD-10-CM | POA: Diagnosis not present

## 2017-07-01 DIAGNOSIS — M5441 Lumbago with sciatica, right side: Secondary | ICD-10-CM | POA: Diagnosis not present

## 2017-07-05 DIAGNOSIS — M5442 Lumbago with sciatica, left side: Secondary | ICD-10-CM | POA: Diagnosis not present

## 2017-07-05 DIAGNOSIS — M5136 Other intervertebral disc degeneration, lumbar region: Secondary | ICD-10-CM | POA: Diagnosis not present

## 2017-07-05 DIAGNOSIS — M5137 Other intervertebral disc degeneration, lumbosacral region: Secondary | ICD-10-CM | POA: Diagnosis not present

## 2017-07-05 DIAGNOSIS — M5127 Other intervertebral disc displacement, lumbosacral region: Secondary | ICD-10-CM | POA: Diagnosis not present

## 2017-07-05 DIAGNOSIS — M9904 Segmental and somatic dysfunction of sacral region: Secondary | ICD-10-CM | POA: Diagnosis not present

## 2017-07-05 DIAGNOSIS — M5441 Lumbago with sciatica, right side: Secondary | ICD-10-CM | POA: Diagnosis not present

## 2017-07-05 DIAGNOSIS — M7918 Myalgia, other site: Secondary | ICD-10-CM | POA: Diagnosis not present

## 2017-07-05 DIAGNOSIS — M48061 Spinal stenosis, lumbar region without neurogenic claudication: Secondary | ICD-10-CM | POA: Diagnosis not present

## 2017-07-05 DIAGNOSIS — M5126 Other intervertebral disc displacement, lumbar region: Secondary | ICD-10-CM | POA: Diagnosis not present

## 2017-07-05 DIAGNOSIS — M9903 Segmental and somatic dysfunction of lumbar region: Secondary | ICD-10-CM | POA: Diagnosis not present

## 2017-07-07 DIAGNOSIS — M5136 Other intervertebral disc degeneration, lumbar region: Secondary | ICD-10-CM | POA: Diagnosis not present

## 2017-07-07 DIAGNOSIS — M48061 Spinal stenosis, lumbar region without neurogenic claudication: Secondary | ICD-10-CM | POA: Diagnosis not present

## 2017-07-07 DIAGNOSIS — M5137 Other intervertebral disc degeneration, lumbosacral region: Secondary | ICD-10-CM | POA: Diagnosis not present

## 2017-07-07 DIAGNOSIS — M9904 Segmental and somatic dysfunction of sacral region: Secondary | ICD-10-CM | POA: Diagnosis not present

## 2017-07-07 DIAGNOSIS — M5127 Other intervertebral disc displacement, lumbosacral region: Secondary | ICD-10-CM | POA: Diagnosis not present

## 2017-07-07 DIAGNOSIS — M7918 Myalgia, other site: Secondary | ICD-10-CM | POA: Diagnosis not present

## 2017-07-07 DIAGNOSIS — M5441 Lumbago with sciatica, right side: Secondary | ICD-10-CM | POA: Diagnosis not present

## 2017-07-07 DIAGNOSIS — M9903 Segmental and somatic dysfunction of lumbar region: Secondary | ICD-10-CM | POA: Diagnosis not present

## 2017-07-07 DIAGNOSIS — M5126 Other intervertebral disc displacement, lumbar region: Secondary | ICD-10-CM | POA: Diagnosis not present

## 2017-07-07 DIAGNOSIS — M5442 Lumbago with sciatica, left side: Secondary | ICD-10-CM | POA: Diagnosis not present

## 2017-07-08 DIAGNOSIS — M5137 Other intervertebral disc degeneration, lumbosacral region: Secondary | ICD-10-CM | POA: Diagnosis not present

## 2017-07-08 DIAGNOSIS — M5136 Other intervertebral disc degeneration, lumbar region: Secondary | ICD-10-CM | POA: Diagnosis not present

## 2017-07-08 DIAGNOSIS — M48061 Spinal stenosis, lumbar region without neurogenic claudication: Secondary | ICD-10-CM | POA: Diagnosis not present

## 2017-07-08 DIAGNOSIS — M5442 Lumbago with sciatica, left side: Secondary | ICD-10-CM | POA: Diagnosis not present

## 2017-07-08 DIAGNOSIS — M9904 Segmental and somatic dysfunction of sacral region: Secondary | ICD-10-CM | POA: Diagnosis not present

## 2017-07-08 DIAGNOSIS — M5441 Lumbago with sciatica, right side: Secondary | ICD-10-CM | POA: Diagnosis not present

## 2017-07-08 DIAGNOSIS — M7918 Myalgia, other site: Secondary | ICD-10-CM | POA: Diagnosis not present

## 2017-07-08 DIAGNOSIS — M5126 Other intervertebral disc displacement, lumbar region: Secondary | ICD-10-CM | POA: Diagnosis not present

## 2017-07-08 DIAGNOSIS — M5127 Other intervertebral disc displacement, lumbosacral region: Secondary | ICD-10-CM | POA: Diagnosis not present

## 2017-07-08 DIAGNOSIS — M9903 Segmental and somatic dysfunction of lumbar region: Secondary | ICD-10-CM | POA: Diagnosis not present

## 2017-07-12 DIAGNOSIS — M9904 Segmental and somatic dysfunction of sacral region: Secondary | ICD-10-CM | POA: Diagnosis not present

## 2017-07-12 DIAGNOSIS — M7918 Myalgia, other site: Secondary | ICD-10-CM | POA: Diagnosis not present

## 2017-07-12 DIAGNOSIS — M5126 Other intervertebral disc displacement, lumbar region: Secondary | ICD-10-CM | POA: Diagnosis not present

## 2017-07-12 DIAGNOSIS — M9903 Segmental and somatic dysfunction of lumbar region: Secondary | ICD-10-CM | POA: Diagnosis not present

## 2017-07-12 DIAGNOSIS — M48061 Spinal stenosis, lumbar region without neurogenic claudication: Secondary | ICD-10-CM | POA: Diagnosis not present

## 2017-07-12 DIAGNOSIS — M5136 Other intervertebral disc degeneration, lumbar region: Secondary | ICD-10-CM | POA: Diagnosis not present

## 2017-07-12 DIAGNOSIS — M5137 Other intervertebral disc degeneration, lumbosacral region: Secondary | ICD-10-CM | POA: Diagnosis not present

## 2017-07-12 DIAGNOSIS — M5442 Lumbago with sciatica, left side: Secondary | ICD-10-CM | POA: Diagnosis not present

## 2017-07-12 DIAGNOSIS — M5441 Lumbago with sciatica, right side: Secondary | ICD-10-CM | POA: Diagnosis not present

## 2017-07-12 DIAGNOSIS — M5127 Other intervertebral disc displacement, lumbosacral region: Secondary | ICD-10-CM | POA: Diagnosis not present

## 2017-07-14 DIAGNOSIS — M5136 Other intervertebral disc degeneration, lumbar region: Secondary | ICD-10-CM | POA: Diagnosis not present

## 2017-07-14 DIAGNOSIS — M48061 Spinal stenosis, lumbar region without neurogenic claudication: Secondary | ICD-10-CM | POA: Diagnosis not present

## 2017-07-14 DIAGNOSIS — M5442 Lumbago with sciatica, left side: Secondary | ICD-10-CM | POA: Diagnosis not present

## 2017-07-14 DIAGNOSIS — M5127 Other intervertebral disc displacement, lumbosacral region: Secondary | ICD-10-CM | POA: Diagnosis not present

## 2017-07-14 DIAGNOSIS — M5137 Other intervertebral disc degeneration, lumbosacral region: Secondary | ICD-10-CM | POA: Diagnosis not present

## 2017-07-14 DIAGNOSIS — M9903 Segmental and somatic dysfunction of lumbar region: Secondary | ICD-10-CM | POA: Diagnosis not present

## 2017-07-14 DIAGNOSIS — M7918 Myalgia, other site: Secondary | ICD-10-CM | POA: Diagnosis not present

## 2017-07-14 DIAGNOSIS — M5441 Lumbago with sciatica, right side: Secondary | ICD-10-CM | POA: Diagnosis not present

## 2017-07-14 DIAGNOSIS — M9904 Segmental and somatic dysfunction of sacral region: Secondary | ICD-10-CM | POA: Diagnosis not present

## 2017-07-14 DIAGNOSIS — M5126 Other intervertebral disc displacement, lumbar region: Secondary | ICD-10-CM | POA: Diagnosis not present

## 2017-07-15 DIAGNOSIS — M5127 Other intervertebral disc displacement, lumbosacral region: Secondary | ICD-10-CM | POA: Diagnosis not present

## 2017-07-15 DIAGNOSIS — M5137 Other intervertebral disc degeneration, lumbosacral region: Secondary | ICD-10-CM | POA: Diagnosis not present

## 2017-07-15 DIAGNOSIS — M5441 Lumbago with sciatica, right side: Secondary | ICD-10-CM | POA: Diagnosis not present

## 2017-07-15 DIAGNOSIS — M5126 Other intervertebral disc displacement, lumbar region: Secondary | ICD-10-CM | POA: Diagnosis not present

## 2017-07-15 DIAGNOSIS — M5136 Other intervertebral disc degeneration, lumbar region: Secondary | ICD-10-CM | POA: Diagnosis not present

## 2017-07-15 DIAGNOSIS — M7918 Myalgia, other site: Secondary | ICD-10-CM | POA: Diagnosis not present

## 2017-07-15 DIAGNOSIS — M9903 Segmental and somatic dysfunction of lumbar region: Secondary | ICD-10-CM | POA: Diagnosis not present

## 2017-07-15 DIAGNOSIS — M9904 Segmental and somatic dysfunction of sacral region: Secondary | ICD-10-CM | POA: Diagnosis not present

## 2017-07-15 DIAGNOSIS — M5442 Lumbago with sciatica, left side: Secondary | ICD-10-CM | POA: Diagnosis not present

## 2017-07-15 DIAGNOSIS — M48061 Spinal stenosis, lumbar region without neurogenic claudication: Secondary | ICD-10-CM | POA: Diagnosis not present

## 2017-07-19 DIAGNOSIS — M48061 Spinal stenosis, lumbar region without neurogenic claudication: Secondary | ICD-10-CM | POA: Diagnosis not present

## 2017-07-19 DIAGNOSIS — M5441 Lumbago with sciatica, right side: Secondary | ICD-10-CM | POA: Diagnosis not present

## 2017-07-19 DIAGNOSIS — M5127 Other intervertebral disc displacement, lumbosacral region: Secondary | ICD-10-CM | POA: Diagnosis not present

## 2017-07-19 DIAGNOSIS — M5442 Lumbago with sciatica, left side: Secondary | ICD-10-CM | POA: Diagnosis not present

## 2017-07-19 DIAGNOSIS — M5136 Other intervertebral disc degeneration, lumbar region: Secondary | ICD-10-CM | POA: Diagnosis not present

## 2017-07-19 DIAGNOSIS — M9904 Segmental and somatic dysfunction of sacral region: Secondary | ICD-10-CM | POA: Diagnosis not present

## 2017-07-19 DIAGNOSIS — M5137 Other intervertebral disc degeneration, lumbosacral region: Secondary | ICD-10-CM | POA: Diagnosis not present

## 2017-07-19 DIAGNOSIS — M7918 Myalgia, other site: Secondary | ICD-10-CM | POA: Diagnosis not present

## 2017-07-19 DIAGNOSIS — M5126 Other intervertebral disc displacement, lumbar region: Secondary | ICD-10-CM | POA: Diagnosis not present

## 2017-07-19 DIAGNOSIS — M9903 Segmental and somatic dysfunction of lumbar region: Secondary | ICD-10-CM | POA: Diagnosis not present

## 2017-07-22 DIAGNOSIS — M9904 Segmental and somatic dysfunction of sacral region: Secondary | ICD-10-CM | POA: Diagnosis not present

## 2017-07-22 DIAGNOSIS — M5127 Other intervertebral disc displacement, lumbosacral region: Secondary | ICD-10-CM | POA: Diagnosis not present

## 2017-07-22 DIAGNOSIS — M48061 Spinal stenosis, lumbar region without neurogenic claudication: Secondary | ICD-10-CM | POA: Diagnosis not present

## 2017-07-22 DIAGNOSIS — M9903 Segmental and somatic dysfunction of lumbar region: Secondary | ICD-10-CM | POA: Diagnosis not present

## 2017-07-22 DIAGNOSIS — M5126 Other intervertebral disc displacement, lumbar region: Secondary | ICD-10-CM | POA: Diagnosis not present

## 2017-07-22 DIAGNOSIS — M5442 Lumbago with sciatica, left side: Secondary | ICD-10-CM | POA: Diagnosis not present

## 2017-07-22 DIAGNOSIS — M5137 Other intervertebral disc degeneration, lumbosacral region: Secondary | ICD-10-CM | POA: Diagnosis not present

## 2017-07-22 DIAGNOSIS — M7918 Myalgia, other site: Secondary | ICD-10-CM | POA: Diagnosis not present

## 2017-07-22 DIAGNOSIS — M5441 Lumbago with sciatica, right side: Secondary | ICD-10-CM | POA: Diagnosis not present

## 2017-07-22 DIAGNOSIS — M5136 Other intervertebral disc degeneration, lumbar region: Secondary | ICD-10-CM | POA: Diagnosis not present

## 2017-07-27 DIAGNOSIS — M5442 Lumbago with sciatica, left side: Secondary | ICD-10-CM | POA: Diagnosis not present

## 2017-07-27 DIAGNOSIS — M9903 Segmental and somatic dysfunction of lumbar region: Secondary | ICD-10-CM | POA: Diagnosis not present

## 2017-07-27 DIAGNOSIS — M5137 Other intervertebral disc degeneration, lumbosacral region: Secondary | ICD-10-CM | POA: Diagnosis not present

## 2017-07-27 DIAGNOSIS — M7918 Myalgia, other site: Secondary | ICD-10-CM | POA: Diagnosis not present

## 2017-07-27 DIAGNOSIS — M5441 Lumbago with sciatica, right side: Secondary | ICD-10-CM | POA: Diagnosis not present

## 2017-07-27 DIAGNOSIS — M9904 Segmental and somatic dysfunction of sacral region: Secondary | ICD-10-CM | POA: Diagnosis not present

## 2017-07-27 DIAGNOSIS — M5127 Other intervertebral disc displacement, lumbosacral region: Secondary | ICD-10-CM | POA: Diagnosis not present

## 2017-07-27 DIAGNOSIS — M5136 Other intervertebral disc degeneration, lumbar region: Secondary | ICD-10-CM | POA: Diagnosis not present

## 2017-07-27 DIAGNOSIS — M48061 Spinal stenosis, lumbar region without neurogenic claudication: Secondary | ICD-10-CM | POA: Diagnosis not present

## 2017-07-27 DIAGNOSIS — M5126 Other intervertebral disc displacement, lumbar region: Secondary | ICD-10-CM | POA: Diagnosis not present

## 2017-07-29 DIAGNOSIS — M5441 Lumbago with sciatica, right side: Secondary | ICD-10-CM | POA: Diagnosis not present

## 2017-07-29 DIAGNOSIS — M48061 Spinal stenosis, lumbar region without neurogenic claudication: Secondary | ICD-10-CM | POA: Diagnosis not present

## 2017-07-29 DIAGNOSIS — M5137 Other intervertebral disc degeneration, lumbosacral region: Secondary | ICD-10-CM | POA: Diagnosis not present

## 2017-07-29 DIAGNOSIS — M9903 Segmental and somatic dysfunction of lumbar region: Secondary | ICD-10-CM | POA: Diagnosis not present

## 2017-07-29 DIAGNOSIS — M5126 Other intervertebral disc displacement, lumbar region: Secondary | ICD-10-CM | POA: Diagnosis not present

## 2017-07-29 DIAGNOSIS — M5442 Lumbago with sciatica, left side: Secondary | ICD-10-CM | POA: Diagnosis not present

## 2017-07-29 DIAGNOSIS — M9904 Segmental and somatic dysfunction of sacral region: Secondary | ICD-10-CM | POA: Diagnosis not present

## 2017-07-29 DIAGNOSIS — M5127 Other intervertebral disc displacement, lumbosacral region: Secondary | ICD-10-CM | POA: Diagnosis not present

## 2017-07-29 DIAGNOSIS — M7918 Myalgia, other site: Secondary | ICD-10-CM | POA: Diagnosis not present

## 2017-07-29 DIAGNOSIS — M5136 Other intervertebral disc degeneration, lumbar region: Secondary | ICD-10-CM | POA: Diagnosis not present

## 2017-08-02 DIAGNOSIS — M5441 Lumbago with sciatica, right side: Secondary | ICD-10-CM | POA: Diagnosis not present

## 2017-08-02 DIAGNOSIS — M5136 Other intervertebral disc degeneration, lumbar region: Secondary | ICD-10-CM | POA: Diagnosis not present

## 2017-08-02 DIAGNOSIS — M7918 Myalgia, other site: Secondary | ICD-10-CM | POA: Diagnosis not present

## 2017-08-02 DIAGNOSIS — M9903 Segmental and somatic dysfunction of lumbar region: Secondary | ICD-10-CM | POA: Diagnosis not present

## 2017-08-02 DIAGNOSIS — M5137 Other intervertebral disc degeneration, lumbosacral region: Secondary | ICD-10-CM | POA: Diagnosis not present

## 2017-08-02 DIAGNOSIS — M5126 Other intervertebral disc displacement, lumbar region: Secondary | ICD-10-CM | POA: Diagnosis not present

## 2017-08-02 DIAGNOSIS — M5442 Lumbago with sciatica, left side: Secondary | ICD-10-CM | POA: Diagnosis not present

## 2017-08-02 DIAGNOSIS — M9904 Segmental and somatic dysfunction of sacral region: Secondary | ICD-10-CM | POA: Diagnosis not present

## 2017-08-02 DIAGNOSIS — M48061 Spinal stenosis, lumbar region without neurogenic claudication: Secondary | ICD-10-CM | POA: Diagnosis not present

## 2017-08-02 DIAGNOSIS — M5127 Other intervertebral disc displacement, lumbosacral region: Secondary | ICD-10-CM | POA: Diagnosis not present

## 2017-08-05 DIAGNOSIS — M5136 Other intervertebral disc degeneration, lumbar region: Secondary | ICD-10-CM | POA: Diagnosis not present

## 2017-08-05 DIAGNOSIS — M9904 Segmental and somatic dysfunction of sacral region: Secondary | ICD-10-CM | POA: Diagnosis not present

## 2017-08-05 DIAGNOSIS — M5441 Lumbago with sciatica, right side: Secondary | ICD-10-CM | POA: Diagnosis not present

## 2017-08-05 DIAGNOSIS — M7918 Myalgia, other site: Secondary | ICD-10-CM | POA: Diagnosis not present

## 2017-08-05 DIAGNOSIS — M5442 Lumbago with sciatica, left side: Secondary | ICD-10-CM | POA: Diagnosis not present

## 2017-08-05 DIAGNOSIS — M5127 Other intervertebral disc displacement, lumbosacral region: Secondary | ICD-10-CM | POA: Diagnosis not present

## 2017-08-05 DIAGNOSIS — M5137 Other intervertebral disc degeneration, lumbosacral region: Secondary | ICD-10-CM | POA: Diagnosis not present

## 2017-08-05 DIAGNOSIS — M5126 Other intervertebral disc displacement, lumbar region: Secondary | ICD-10-CM | POA: Diagnosis not present

## 2017-08-05 DIAGNOSIS — M48061 Spinal stenosis, lumbar region without neurogenic claudication: Secondary | ICD-10-CM | POA: Diagnosis not present

## 2017-08-05 DIAGNOSIS — M9903 Segmental and somatic dysfunction of lumbar region: Secondary | ICD-10-CM | POA: Diagnosis not present

## 2017-08-10 ENCOUNTER — Emergency Department: Payer: PPO

## 2017-08-10 ENCOUNTER — Emergency Department
Admission: EM | Admit: 2017-08-10 | Discharge: 2017-08-10 | Disposition: A | Discharge status: Home / Self Care | Payer: PPO | Attending: Emergency Medicine | Admitting: Emergency Medicine

## 2017-08-10 ENCOUNTER — Encounter: Payer: Self-pay | Admitting: *Deleted

## 2017-08-10 ENCOUNTER — Other Ambulatory Visit: Payer: Self-pay

## 2017-08-10 DIAGNOSIS — I129 Hypertensive chronic kidney disease with stage 1 through stage 4 chronic kidney disease, or unspecified chronic kidney disease: Secondary | ICD-10-CM | POA: Insufficient documentation

## 2017-08-10 DIAGNOSIS — R42 Dizziness and giddiness: Secondary | ICD-10-CM

## 2017-08-10 DIAGNOSIS — N183 Chronic kidney disease, stage 3 (moderate): Secondary | ICD-10-CM | POA: Diagnosis not present

## 2017-08-10 DIAGNOSIS — Z96652 Presence of left artificial knee joint: Secondary | ICD-10-CM | POA: Insufficient documentation

## 2017-08-10 DIAGNOSIS — E1122 Type 2 diabetes mellitus with diabetic chronic kidney disease: Secondary | ICD-10-CM | POA: Diagnosis not present

## 2017-08-10 DIAGNOSIS — Z79899 Other long term (current) drug therapy: Secondary | ICD-10-CM | POA: Diagnosis not present

## 2017-08-10 LAB — BASIC METABOLIC PANEL
Anion gap: 10 (ref 5–15)
BUN: 24 mg/dL — ABNORMAL HIGH (ref 6–20)
CALCIUM: 9.5 mg/dL (ref 8.9–10.3)
CO2: 26 mmol/L (ref 22–32)
CREATININE: 1.06 mg/dL — AB (ref 0.44–1.00)
Chloride: 105 mmol/L (ref 101–111)
GFR calc Af Amer: 55 mL/min — ABNORMAL LOW (ref >60)
GFR calc non Af Amer: 48 mL/min — ABNORMAL LOW (ref >60)
Glucose, Bld: 120 mg/dL — ABNORMAL HIGH (ref 65–99)
Potassium: 4.6 mmol/L (ref 3.5–5.1)
SODIUM: 141 mmol/L (ref 135–145)

## 2017-08-10 LAB — CBC
HCT: 43.6 % (ref 35.0–47.0)
Hemoglobin: 14.7 g/dL (ref 12.0–16.0)
MCH: 32.1 pg (ref 26.0–34.0)
MCHC: 33.8 g/dL (ref 32.0–36.0)
MCV: 95 fL (ref 80.0–100.0)
PLATELETS: 260 10*3/uL (ref 150–440)
RBC: 4.59 MIL/uL (ref 3.80–5.20)
RDW: 13.1 % (ref 11.5–14.5)
WBC: 10.9 10*3/uL (ref 3.6–11.0)

## 2017-08-10 LAB — URINALYSIS, COMPLETE (UACMP) WITH MICROSCOPIC
Bacteria, UA: NONE SEEN
Bilirubin Urine: NEGATIVE
GLUCOSE, UA: NEGATIVE mg/dL
Hgb urine dipstick: NEGATIVE
Ketones, ur: NEGATIVE mg/dL
Leukocytes, UA: NEGATIVE
Nitrite: NEGATIVE
PH: 6 (ref 5.0–8.0)
Protein, ur: NEGATIVE mg/dL
SPECIFIC GRAVITY, URINE: 1.005 (ref 1.005–1.030)

## 2017-08-10 LAB — TROPONIN I

## 2017-08-10 NOTE — ED Notes (Signed)
Patient transported to CT 

## 2017-08-10 NOTE — ED Triage Notes (Signed)
Pt ambulatory to triage.  Pt reports dizziness and low blood pressure at home tonight.  Pt went to bed and felt an irregular heart beat.  No chest pain.  Denies sob.  Pt alert.  Speech clear.
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
## 2017-08-12 DIAGNOSIS — M48061 Spinal stenosis, lumbar region without neurogenic claudication: Secondary | ICD-10-CM | POA: Diagnosis not present

## 2017-08-12 DIAGNOSIS — M5126 Other intervertebral disc displacement, lumbar region: Secondary | ICD-10-CM | POA: Diagnosis not present

## 2017-08-12 DIAGNOSIS — M5441 Lumbago with sciatica, right side: Secondary | ICD-10-CM | POA: Diagnosis not present

## 2017-08-12 DIAGNOSIS — M5442 Lumbago with sciatica, left side: Secondary | ICD-10-CM | POA: Diagnosis not present

## 2017-08-12 DIAGNOSIS — M7918 Myalgia, other site: Secondary | ICD-10-CM | POA: Diagnosis not present

## 2017-08-12 DIAGNOSIS — M5136 Other intervertebral disc degeneration, lumbar region: Secondary | ICD-10-CM | POA: Diagnosis not present

## 2017-08-12 DIAGNOSIS — M5127 Other intervertebral disc displacement, lumbosacral region: Secondary | ICD-10-CM | POA: Diagnosis not present

## 2017-08-12 DIAGNOSIS — M5137 Other intervertebral disc degeneration, lumbosacral region: Secondary | ICD-10-CM | POA: Diagnosis not present

## 2017-08-12 DIAGNOSIS — M9903 Segmental and somatic dysfunction of lumbar region: Secondary | ICD-10-CM | POA: Diagnosis not present

## 2017-08-12 DIAGNOSIS — M9904 Segmental and somatic dysfunction of sacral region: Secondary | ICD-10-CM | POA: Diagnosis not present

## 2017-08-16 DIAGNOSIS — M5441 Lumbago with sciatica, right side: Secondary | ICD-10-CM | POA: Diagnosis not present

## 2017-08-16 DIAGNOSIS — M7918 Myalgia, other site: Secondary | ICD-10-CM | POA: Diagnosis not present

## 2017-08-16 DIAGNOSIS — M5127 Other intervertebral disc displacement, lumbosacral region: Secondary | ICD-10-CM | POA: Diagnosis not present

## 2017-08-16 DIAGNOSIS — M9904 Segmental and somatic dysfunction of sacral region: Secondary | ICD-10-CM | POA: Diagnosis not present

## 2017-08-16 DIAGNOSIS — M5137 Other intervertebral disc degeneration, lumbosacral region: Secondary | ICD-10-CM | POA: Diagnosis not present

## 2017-08-16 DIAGNOSIS — M5442 Lumbago with sciatica, left side: Secondary | ICD-10-CM | POA: Diagnosis not present

## 2017-08-16 DIAGNOSIS — M9903 Segmental and somatic dysfunction of lumbar region: Secondary | ICD-10-CM | POA: Diagnosis not present

## 2017-08-16 DIAGNOSIS — M5136 Other intervertebral disc degeneration, lumbar region: Secondary | ICD-10-CM | POA: Diagnosis not present

## 2017-08-16 DIAGNOSIS — M5126 Other intervertebral disc displacement, lumbar region: Secondary | ICD-10-CM | POA: Diagnosis not present

## 2017-08-16 DIAGNOSIS — M48061 Spinal stenosis, lumbar region without neurogenic claudication: Secondary | ICD-10-CM | POA: Diagnosis not present

## 2017-08-19 DIAGNOSIS — M9903 Segmental and somatic dysfunction of lumbar region: Secondary | ICD-10-CM | POA: Diagnosis not present

## 2017-08-19 DIAGNOSIS — M5441 Lumbago with sciatica, right side: Secondary | ICD-10-CM | POA: Diagnosis not present

## 2017-08-19 DIAGNOSIS — M5127 Other intervertebral disc displacement, lumbosacral region: Secondary | ICD-10-CM | POA: Diagnosis not present

## 2017-08-19 DIAGNOSIS — M5442 Lumbago with sciatica, left side: Secondary | ICD-10-CM | POA: Diagnosis not present

## 2017-08-19 DIAGNOSIS — M5136 Other intervertebral disc degeneration, lumbar region: Secondary | ICD-10-CM | POA: Diagnosis not present

## 2017-08-19 DIAGNOSIS — M9904 Segmental and somatic dysfunction of sacral region: Secondary | ICD-10-CM | POA: Diagnosis not present

## 2017-08-19 DIAGNOSIS — M5137 Other intervertebral disc degeneration, lumbosacral region: Secondary | ICD-10-CM | POA: Diagnosis not present

## 2017-08-19 DIAGNOSIS — M5126 Other intervertebral disc displacement, lumbar region: Secondary | ICD-10-CM | POA: Diagnosis not present

## 2017-08-19 DIAGNOSIS — M48061 Spinal stenosis, lumbar region without neurogenic claudication: Secondary | ICD-10-CM | POA: Diagnosis not present

## 2017-08-19 DIAGNOSIS — M7918 Myalgia, other site: Secondary | ICD-10-CM | POA: Diagnosis not present

## 2017-08-23 DIAGNOSIS — M5126 Other intervertebral disc displacement, lumbar region: Secondary | ICD-10-CM | POA: Diagnosis not present

## 2017-08-23 DIAGNOSIS — M7918 Myalgia, other site: Secondary | ICD-10-CM | POA: Diagnosis not present

## 2017-08-23 DIAGNOSIS — M5442 Lumbago with sciatica, left side: Secondary | ICD-10-CM | POA: Diagnosis not present

## 2017-08-23 DIAGNOSIS — M9903 Segmental and somatic dysfunction of lumbar region: Secondary | ICD-10-CM | POA: Diagnosis not present

## 2017-08-23 DIAGNOSIS — M5136 Other intervertebral disc degeneration, lumbar region: Secondary | ICD-10-CM | POA: Diagnosis not present

## 2017-08-23 DIAGNOSIS — M5127 Other intervertebral disc displacement, lumbosacral region: Secondary | ICD-10-CM | POA: Diagnosis not present

## 2017-08-23 DIAGNOSIS — M48061 Spinal stenosis, lumbar region without neurogenic claudication: Secondary | ICD-10-CM | POA: Diagnosis not present

## 2017-08-23 DIAGNOSIS — M5441 Lumbago with sciatica, right side: Secondary | ICD-10-CM | POA: Diagnosis not present

## 2017-08-23 DIAGNOSIS — M5137 Other intervertebral disc degeneration, lumbosacral region: Secondary | ICD-10-CM | POA: Diagnosis not present

## 2017-08-23 DIAGNOSIS — M9904 Segmental and somatic dysfunction of sacral region: Secondary | ICD-10-CM | POA: Diagnosis not present

## 2017-08-26 DIAGNOSIS — M5137 Other intervertebral disc degeneration, lumbosacral region: Secondary | ICD-10-CM | POA: Diagnosis not present

## 2017-08-26 DIAGNOSIS — M5136 Other intervertebral disc degeneration, lumbar region: Secondary | ICD-10-CM | POA: Diagnosis not present

## 2017-08-26 DIAGNOSIS — M5127 Other intervertebral disc displacement, lumbosacral region: Secondary | ICD-10-CM | POA: Diagnosis not present

## 2017-08-26 DIAGNOSIS — M5441 Lumbago with sciatica, right side: Secondary | ICD-10-CM | POA: Diagnosis not present

## 2017-08-26 DIAGNOSIS — M9904 Segmental and somatic dysfunction of sacral region: Secondary | ICD-10-CM | POA: Diagnosis not present

## 2017-08-26 DIAGNOSIS — M48061 Spinal stenosis, lumbar region without neurogenic claudication: Secondary | ICD-10-CM | POA: Diagnosis not present

## 2017-08-26 DIAGNOSIS — M5126 Other intervertebral disc displacement, lumbar region: Secondary | ICD-10-CM | POA: Diagnosis not present

## 2017-08-26 DIAGNOSIS — M7918 Myalgia, other site: Secondary | ICD-10-CM | POA: Diagnosis not present

## 2017-08-26 DIAGNOSIS — M5442 Lumbago with sciatica, left side: Secondary | ICD-10-CM | POA: Diagnosis not present

## 2017-08-26 DIAGNOSIS — M9903 Segmental and somatic dysfunction of lumbar region: Secondary | ICD-10-CM | POA: Diagnosis not present

## 2017-08-30 DIAGNOSIS — M5441 Lumbago with sciatica, right side: Secondary | ICD-10-CM | POA: Diagnosis not present

## 2017-08-30 DIAGNOSIS — M9903 Segmental and somatic dysfunction of lumbar region: Secondary | ICD-10-CM | POA: Diagnosis not present

## 2017-08-30 DIAGNOSIS — M5126 Other intervertebral disc displacement, lumbar region: Secondary | ICD-10-CM | POA: Diagnosis not present

## 2017-08-30 DIAGNOSIS — M5136 Other intervertebral disc degeneration, lumbar region: Secondary | ICD-10-CM | POA: Diagnosis not present

## 2017-08-30 DIAGNOSIS — M48061 Spinal stenosis, lumbar region without neurogenic claudication: Secondary | ICD-10-CM | POA: Diagnosis not present

## 2017-08-30 DIAGNOSIS — M5127 Other intervertebral disc displacement, lumbosacral region: Secondary | ICD-10-CM | POA: Diagnosis not present

## 2017-08-30 DIAGNOSIS — M7918 Myalgia, other site: Secondary | ICD-10-CM | POA: Diagnosis not present

## 2017-08-30 DIAGNOSIS — M5442 Lumbago with sciatica, left side: Secondary | ICD-10-CM | POA: Diagnosis not present

## 2017-08-30 DIAGNOSIS — M9904 Segmental and somatic dysfunction of sacral region: Secondary | ICD-10-CM | POA: Diagnosis not present

## 2017-08-30 DIAGNOSIS — M5137 Other intervertebral disc degeneration, lumbosacral region: Secondary | ICD-10-CM | POA: Diagnosis not present

## 2017-09-01 DIAGNOSIS — M5127 Other intervertebral disc displacement, lumbosacral region: Secondary | ICD-10-CM | POA: Diagnosis not present

## 2017-09-01 DIAGNOSIS — M5126 Other intervertebral disc displacement, lumbar region: Secondary | ICD-10-CM | POA: Diagnosis not present

## 2017-09-01 DIAGNOSIS — M9903 Segmental and somatic dysfunction of lumbar region: Secondary | ICD-10-CM | POA: Diagnosis not present

## 2017-09-01 DIAGNOSIS — M7918 Myalgia, other site: Secondary | ICD-10-CM | POA: Diagnosis not present

## 2017-09-01 DIAGNOSIS — M5137 Other intervertebral disc degeneration, lumbosacral region: Secondary | ICD-10-CM | POA: Diagnosis not present

## 2017-09-01 DIAGNOSIS — M5136 Other intervertebral disc degeneration, lumbar region: Secondary | ICD-10-CM | POA: Diagnosis not present

## 2017-09-01 DIAGNOSIS — M48061 Spinal stenosis, lumbar region without neurogenic claudication: Secondary | ICD-10-CM | POA: Diagnosis not present

## 2017-09-01 DIAGNOSIS — M9904 Segmental and somatic dysfunction of sacral region: Secondary | ICD-10-CM | POA: Diagnosis not present

## 2017-09-01 DIAGNOSIS — M5442 Lumbago with sciatica, left side: Secondary | ICD-10-CM | POA: Diagnosis not present

## 2017-09-01 DIAGNOSIS — M5441 Lumbago with sciatica, right side: Secondary | ICD-10-CM | POA: Diagnosis not present

## 2017-09-07 DIAGNOSIS — M5136 Other intervertebral disc degeneration, lumbar region: Secondary | ICD-10-CM | POA: Diagnosis not present

## 2017-09-07 DIAGNOSIS — M9904 Segmental and somatic dysfunction of sacral region: Secondary | ICD-10-CM | POA: Diagnosis not present

## 2017-09-07 DIAGNOSIS — M7918 Myalgia, other site: Secondary | ICD-10-CM | POA: Diagnosis not present

## 2017-09-07 DIAGNOSIS — M5441 Lumbago with sciatica, right side: Secondary | ICD-10-CM | POA: Diagnosis not present

## 2017-09-07 DIAGNOSIS — M5137 Other intervertebral disc degeneration, lumbosacral region: Secondary | ICD-10-CM | POA: Diagnosis not present

## 2017-09-07 DIAGNOSIS — M5442 Lumbago with sciatica, left side: Secondary | ICD-10-CM | POA: Diagnosis not present

## 2017-09-07 DIAGNOSIS — M5126 Other intervertebral disc displacement, lumbar region: Secondary | ICD-10-CM | POA: Diagnosis not present

## 2017-09-07 DIAGNOSIS — M9903 Segmental and somatic dysfunction of lumbar region: Secondary | ICD-10-CM | POA: Diagnosis not present

## 2017-09-07 DIAGNOSIS — M48061 Spinal stenosis, lumbar region without neurogenic claudication: Secondary | ICD-10-CM | POA: Diagnosis not present

## 2017-09-07 DIAGNOSIS — M5127 Other intervertebral disc displacement, lumbosacral region: Secondary | ICD-10-CM | POA: Diagnosis not present

## 2017-09-14 DIAGNOSIS — M9904 Segmental and somatic dysfunction of sacral region: Secondary | ICD-10-CM | POA: Diagnosis not present

## 2017-09-14 DIAGNOSIS — M5441 Lumbago with sciatica, right side: Secondary | ICD-10-CM | POA: Diagnosis not present

## 2017-09-14 DIAGNOSIS — M9903 Segmental and somatic dysfunction of lumbar region: Secondary | ICD-10-CM | POA: Diagnosis not present

## 2017-09-14 DIAGNOSIS — M5442 Lumbago with sciatica, left side: Secondary | ICD-10-CM | POA: Diagnosis not present

## 2017-09-17 ENCOUNTER — Other Ambulatory Visit: Payer: Self-pay | Admitting: Family Medicine

## 2017-09-22 DIAGNOSIS — M9904 Segmental and somatic dysfunction of sacral region: Secondary | ICD-10-CM | POA: Diagnosis not present

## 2017-09-22 DIAGNOSIS — M5442 Lumbago with sciatica, left side: Secondary | ICD-10-CM | POA: Diagnosis not present

## 2017-09-22 DIAGNOSIS — M9903 Segmental and somatic dysfunction of lumbar region: Secondary | ICD-10-CM | POA: Diagnosis not present

## 2017-09-22 DIAGNOSIS — M5441 Lumbago with sciatica, right side: Secondary | ICD-10-CM | POA: Diagnosis not present

## 2017-09-28 DIAGNOSIS — M5442 Lumbago with sciatica, left side: Secondary | ICD-10-CM | POA: Diagnosis not present

## 2017-09-28 DIAGNOSIS — M5441 Lumbago with sciatica, right side: Secondary | ICD-10-CM | POA: Diagnosis not present

## 2017-09-28 DIAGNOSIS — M9903 Segmental and somatic dysfunction of lumbar region: Secondary | ICD-10-CM | POA: Diagnosis not present

## 2017-09-28 DIAGNOSIS — M9904 Segmental and somatic dysfunction of sacral region: Secondary | ICD-10-CM | POA: Diagnosis not present

## 2017-10-05 DIAGNOSIS — M9903 Segmental and somatic dysfunction of lumbar region: Secondary | ICD-10-CM | POA: Diagnosis not present

## 2017-10-05 DIAGNOSIS — M5442 Lumbago with sciatica, left side: Secondary | ICD-10-CM | POA: Diagnosis not present

## 2017-10-05 DIAGNOSIS — M5441 Lumbago with sciatica, right side: Secondary | ICD-10-CM | POA: Diagnosis not present

## 2017-10-05 DIAGNOSIS — M9904 Segmental and somatic dysfunction of sacral region: Secondary | ICD-10-CM | POA: Diagnosis not present

## 2017-10-12 ENCOUNTER — Ambulatory Visit: Payer: PPO

## 2017-10-12 ENCOUNTER — Other Ambulatory Visit: Payer: Self-pay | Admitting: Family Medicine

## 2017-10-12 ENCOUNTER — Ambulatory Visit (INDEPENDENT_AMBULATORY_CARE_PROVIDER_SITE_OTHER): Payer: PPO

## 2017-10-12 VITALS — BP 118/60 | HR 61 | Temp 98.1°F | Ht 65.5 in | Wt 151.1 lb

## 2017-10-12 DIAGNOSIS — E1122 Type 2 diabetes mellitus with diabetic chronic kidney disease: Secondary | ICD-10-CM

## 2017-10-12 DIAGNOSIS — E785 Hyperlipidemia, unspecified: Secondary | ICD-10-CM | POA: Diagnosis not present

## 2017-10-12 DIAGNOSIS — I10 Essential (primary) hypertension: Secondary | ICD-10-CM

## 2017-10-12 DIAGNOSIS — Z0000 Encounter for general adult medical examination without abnormal findings: Secondary | ICD-10-CM

## 2017-10-12 DIAGNOSIS — E119 Type 2 diabetes mellitus without complications: Secondary | ICD-10-CM

## 2017-10-12 DIAGNOSIS — N183 Chronic kidney disease, stage 3 (moderate): Secondary | ICD-10-CM

## 2017-10-12 LAB — COMPREHENSIVE METABOLIC PANEL
ALT: 19 U/L (ref 0–35)
AST: 19 U/L (ref 0–37)
Albumin: 4.2 g/dL (ref 3.5–5.2)
Alkaline Phosphatase: 73 U/L (ref 39–117)
BILIRUBIN TOTAL: 0.7 mg/dL (ref 0.2–1.2)
BUN: 18 mg/dL (ref 6–23)
CALCIUM: 9.8 mg/dL (ref 8.4–10.5)
CO2: 29 meq/L (ref 19–32)
Chloride: 105 mEq/L (ref 96–112)
Creatinine, Ser: 1.15 mg/dL (ref 0.40–1.20)
GFR: 47.96 mL/min — AB (ref >60.00)
GLUCOSE: 143 mg/dL — AB (ref 70–99)
POTASSIUM: 4.9 meq/L (ref 3.5–5.1)
Sodium: 140 mEq/L (ref 135–145)
Total Protein: 7.4 g/dL (ref 6.0–8.3)

## 2017-10-12 LAB — LIPID PANEL
Cholesterol: 168 mg/dL (ref 0–200)
HDL: 52.3 mg/dL (ref >39.00)
LDL Cholesterol: 87 mg/dL (ref 0–99)
NONHDL: 116.17
TRIGLYCERIDES: 146 mg/dL (ref 0.0–149.0)
Total CHOL/HDL Ratio: 3
VLDL: 29.2 mg/dL (ref 0.0–40.0)

## 2017-10-12 LAB — CBC WITH DIFFERENTIAL/PLATELET
BASOS PCT: 2.1 % (ref 0.0–3.0)
Basophils Absolute: 0.1 10*3/uL (ref 0.0–0.1)
EOS PCT: 4.5 % (ref 0.0–5.0)
Eosinophils Absolute: 0.3 10*3/uL (ref 0.0–0.7)
HCT: 40.9 % (ref 36.0–46.0)
Hemoglobin: 13.7 g/dL (ref 12.0–15.0)
LYMPHS ABS: 1.9 10*3/uL (ref 0.7–4.0)
Lymphocytes Relative: 25.9 % (ref 12.0–46.0)
MCHC: 33.6 g/dL (ref 30.0–36.0)
MCV: 94.8 fl (ref 78.0–100.0)
MONO ABS: 0.5 10*3/uL (ref 0.1–1.0)
Monocytes Relative: 7.4 % (ref 3.0–12.0)
NEUTROS ABS: 4.3 10*3/uL (ref 1.4–7.7)
NEUTROS PCT: 60.1 % (ref 43.0–77.0)
PLATELETS: 244 10*3/uL (ref 150.0–400.0)
RBC: 4.32 Mil/uL (ref 3.87–5.11)
RDW: 12.8 % (ref 11.5–15.5)
WBC: 7.2 10*3/uL (ref 4.0–10.5)

## 2017-10-12 LAB — HEMOGLOBIN A1C: Hgb A1c MFr Bld: 6.8 % — ABNORMAL HIGH (ref 4.6–6.5)

## 2017-10-12 LAB — MICROALBUMIN / CREATININE URINE RATIO
Creatinine,U: 87.1 mg/dL
MICROALB/CREAT RATIO: 3.7 mg/g (ref 0.0–30.0)
Microalb, Ur: 3.2 mg/dL — ABNORMAL HIGH (ref 0.0–1.9)

## 2017-10-12 LAB — VITAMIN D 25 HYDROXY (VIT D DEFICIENCY, FRACTURES): VITD: 29.07 ng/mL — AB (ref 30.00–100.00)

## 2017-10-12 NOTE — Patient Instructions (Signed)
Ms. Stanek , Thank you for taking time to come for your Medicare Wellness Visit. I appreciate your ongoing commitment to your health goals. Please review the following plan we discussed and let me know if I can assist you in the future.   These are the goals we discussed: Goals    . Increase physical activity     Starting 10/12/2017, I will continue exercising for at least 60 min 3 days per week.        This is a list of the screening recommended for you and due dates:  Health Maintenance  Topic Date Due  . Flu Shot  05/31/2018*  . Tetanus Vaccine  07/26/2025*  . DTaP/Tdap/Td vaccine (1 - Tdap) 07/28/2025*  . Complete foot exam   04/08/2018  . Hemoglobin A1C  04/14/2018  . Eye exam for diabetics  05/04/2018  . DEXA scan (bone density measurement)  Completed  . Pneumonia vaccines  Completed  *Topic was postponed. The date shown is not the original due date.   Preventive Care for Adults  A healthy lifestyle and preventive care can promote health and wellness. Preventive health guidelines for adults include the following key practices.  . A routine yearly physical is a good way to check with your health care provider about your health and preventive screening. It is a chance to share any concerns and updates on your health and to receive a thorough exam.  . Visit your dentist for a routine exam and preventive care every 6 months. Brush your teeth twice a day and floss once a day. Good oral hygiene prevents tooth decay and gum disease.  . The frequency of eye exams is based on your age, health, family medical history, use  of contact lenses, and other factors. Follow your health care provider's recommendations for frequency of eye exams.  . Eat a healthy diet. Foods like vegetables, fruits, whole grains, low-fat dairy products, and lean protein foods contain the nutrients you need without too many calories. Decrease your intake of foods high in solid fats, added sugars, and salt. Eat  the right amount of calories for you. Get information about a proper diet from your health care provider, if necessary.  . Regular physical exercise is one of the most important things you can do for your health. Most adults should get at least 150 minutes of moderate-intensity exercise (any activity that increases your heart rate and causes you to sweat) each week. In addition, most adults need muscle-strengthening exercises on 2 or more days a week.  Silver Sneakers may be a benefit available to you. To determine eligibility, you may visit the website: www.silversneakers.com or contact program at 8571120556 Mon-Fri between 8AM-8PM.   . Maintain a healthy weight. The body mass index (BMI) is a screening tool to identify possible weight problems. It provides an estimate of body fat based on height and weight. Your health care provider can find your BMI and can help you achieve or maintain a healthy weight.   For adults 20 years and older: ? A BMI below 18.5 is considered underweight. ? A BMI of 18.5 to 24.9 is normal. ? A BMI of 25 to 29.9 is considered overweight. ? A BMI of 30 and above is considered obese.   . Maintain normal blood lipids and cholesterol levels by exercising and minimizing your intake of saturated fat. Eat a balanced diet with plenty of fruit and vegetables. Blood tests for lipids and cholesterol should begin at age 68 and be  repeated every 5 years. If your lipid or cholesterol levels are high, you are over 50, or you are at high risk for heart disease, you may need your cholesterol levels checked more frequently. Ongoing high lipid and cholesterol levels should be treated with medicines if diet and exercise are not working.  . If you smoke, find out from your health care provider how to quit. If you do not use tobacco, please do not start.  . If you choose to drink alcohol, please do not consume more than 2 drinks per day. One drink is considered to be 12 ounces (355 mL)  of beer, 5 ounces (148 mL) of wine, or 1.5 ounces (44 mL) of liquor.  . If you are 7-76 years old, ask your health care provider if you should take aspirin to prevent strokes.  . Use sunscreen. Apply sunscreen liberally and repeatedly throughout the day. You should seek shade when your shadow is shorter than you. Protect yourself by wearing long sleeves, pants, a wide-brimmed hat, and sunglasses year round, whenever you are outdoors.  . Once a month, do a whole body skin exam, using a mirror to look at the skin on your back. Tell your health care provider of new moles, moles that have irregular borders, moles that are larger than a pencil eraser, or moles that have changed in shape or color.

## 2017-10-13 ENCOUNTER — Ambulatory Visit: Payer: PPO | Admitting: Internal Medicine

## 2017-10-13 ENCOUNTER — Other Ambulatory Visit: Payer: Self-pay | Admitting: Family Medicine

## 2017-10-13 DIAGNOSIS — M9903 Segmental and somatic dysfunction of lumbar region: Secondary | ICD-10-CM | POA: Diagnosis not present

## 2017-10-13 DIAGNOSIS — M5442 Lumbago with sciatica, left side: Secondary | ICD-10-CM | POA: Diagnosis not present

## 2017-10-13 DIAGNOSIS — M9904 Segmental and somatic dysfunction of sacral region: Secondary | ICD-10-CM | POA: Diagnosis not present

## 2017-10-13 DIAGNOSIS — M5441 Lumbago with sciatica, right side: Secondary | ICD-10-CM | POA: Diagnosis not present

## 2017-10-13 DIAGNOSIS — M4727 Other spondylosis with radiculopathy, lumbosacral region: Secondary | ICD-10-CM
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
## 2017-10-13 NOTE — Progress Notes (Signed)
PCP notes:   Health maintenance:  Flu vaccine - addressed A1C - completed  Abnormal screenings:   Fall risk - hx of single fall Fall Risk  10/12/2017 10/07/2016 09/27/2015 10/02/2014 09/14/2013  Falls in the past year? Yes Yes No Yes No  Comment fall occurred while walking up stairs; denies injury pt reports tripping over a cord which resulted in a fall - - -  Number falls in past yr: 1 1 - 1 -  Comment - - - Stepped of curb wrong -  Injury with Fall? No No - No -   Patient concerns:   None  Nurse concerns:  None  Next PCP appt:   10/19/17 @ 1530

## 2017-10-14 ENCOUNTER — Ambulatory Visit (INDEPENDENT_AMBULATORY_CARE_PROVIDER_SITE_OTHER): Payer: PPO | Admitting: Cardiovascular Disease

## 2017-10-14 ENCOUNTER — Encounter: Payer: Self-pay | Admitting: Cardiovascular Disease

## 2017-10-14 VITALS — BP 145/66 | HR 59 | Ht 65.5 in | Wt 153.8 lb

## 2017-10-14 DIAGNOSIS — I10 Essential (primary) hypertension: Secondary | ICD-10-CM

## 2017-10-14 DIAGNOSIS — R002 Palpitations: Secondary | ICD-10-CM | POA: Diagnosis not present

## 2017-10-14 MED ORDER — HYDROCHLOROTHIAZIDE 12.5 MG PO CAPS: 12.5000 mg | ORAL_CAPSULE | Freq: Every day | ORAL | 3 refills | Status: DC
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
## 2017-10-14 NOTE — Patient Instructions (Signed)
Medication Instructions: STOP the Amlodipine START Hydrochlorothiazide 12.5 mg daily  If you need a refill on your cardiac medications before your next appointment, please call your pharmacy.   Labwork: Please have the BMET drawn at your PCP appointment. Orders have been placed.  Procedures/Testing: Your physician has recommended that you wear a 48 hour holter monitor. Holter monitors are medical devices that record the heart's electrical activity. Doctors most often use these monitors to diagnose arrhythmias. Arrhythmias are problems with the speed or rhythm of the heartbeat. The monitor is a small, portable device. You can wear one while you do your normal daily activities. This is usually used to diagnose what is causing palpitations/syncope (passing out).  Follow-Up: Your physician wants you to follow-up as needed with Dr. Fletcher Anon.  Thank you for choosing [facility] at [facility]!  ;

## 2017-10-19 ENCOUNTER — Encounter: Payer: PPO | Admitting: Family Medicine

## 2017-10-19 ENCOUNTER — Encounter: Payer: Self-pay | Admitting: Family Medicine

## 2017-10-19 ENCOUNTER — Ambulatory Visit (INDEPENDENT_AMBULATORY_CARE_PROVIDER_SITE_OTHER): Payer: PPO | Admitting: Family Medicine

## 2017-10-19 VITALS — BP 140/70 | HR 65 | Temp 98.2°F | Ht 65.5 in | Wt 154.0 lb

## 2017-10-19 DIAGNOSIS — L309 Dermatitis, unspecified: Secondary | ICD-10-CM

## 2017-10-19 DIAGNOSIS — Z0000 Encounter for general adult medical examination without abnormal findings: Secondary | ICD-10-CM | POA: Diagnosis not present

## 2017-10-19 DIAGNOSIS — E1122 Type 2 diabetes mellitus with diabetic chronic kidney disease: Secondary | ICD-10-CM

## 2017-10-19 DIAGNOSIS — N183 Chronic kidney disease, stage 3 unspecified: Secondary | ICD-10-CM

## 2017-10-19 DIAGNOSIS — E785 Hyperlipidemia, unspecified: Secondary | ICD-10-CM | POA: Diagnosis not present

## 2017-10-19 DIAGNOSIS — I10 Essential (primary) hypertension: Secondary | ICD-10-CM | POA: Diagnosis not present

## 2017-10-19 DIAGNOSIS — E119 Type 2 diabetes mellitus without complications: Secondary | ICD-10-CM | POA: Diagnosis not present

## 2017-10-19 DIAGNOSIS — Z853 Personal history of malignant neoplasm of breast: Secondary | ICD-10-CM

## 2017-10-19 DIAGNOSIS — N289 Disorder of kidney and ureter, unspecified: Secondary | ICD-10-CM | POA: Diagnosis not present

## 2017-10-19 LAB — POC URINALSYSI DIPSTICK (AUTOMATED)
BILIRUBIN UA: NEGATIVE
Blood, UA: NEGATIVE
GLUCOSE UA: NEGATIVE
KETONES UA: NEGATIVE
LEUKOCYTES UA: NEGATIVE
NITRITE UA: NEGATIVE
Protein, UA: NEGATIVE
Spec Grav, UA: 1.015 (ref 1.010–1.025)
Urobilinogen, UA: 0.2 E.U./dL
pH, UA: 6 (ref 5.0–8.0)

## 2017-10-19 MED ORDER — CLOBETASOL PROPIONATE 0.05 % EX CREA: 1.0000 "application " | TOPICAL_CREAM | Freq: Two times a day (BID) | CUTANEOUS | 1 refills | Status: DC

## 2017-10-19 MED ORDER — LOVASTATIN 40 MG PO TABS: 40.0000 mg | ORAL_TABLET | Freq: Every day | ORAL | 3 refills | Status: DC

## 2017-10-19 NOTE — Progress Notes (Signed)
BP 140/70 (BP Location: Left Arm, Patient Position: Sitting, Cuff Size: Normal)   Pulse 65   Temp 98.2 F (36.8 C) (Oral)   Ht 5' 5.5" (1.664 m)   Wt 154 lb (69.9 kg)   SpO2 98%   BMI 25.24 kg/m    CC: CPE Subjective:    Patient ID: Paige Medina, female    DOB: 1934/02/28, 83 y.o.   MRN: [id number]  HPI: Paige Medina is a 84 y.o. female presenting on 10/19/2017 for Annual Exam (Pt 2.  Pt states her oncologists who monitored her breast CA has retired.  So she will need to get breast exams by Dr. Darnell Level now.)   Recently celebrated 60th wedding anniversary.  Saw Lesia last week for medicare wellness visit. Note reviewed. 1 fall last year - tripped on top step going up stairs.  Receiving steroid injections for lower back pain (spinal stenosis and radiculopathy)  Incidental finding of kidney lesion on MRI - denies blood in urine  Preventative: COLONOSCOPY10/2016 mod diverticulosis o/w WNL, f/u prn [name]) Well woman - s/p hysterectomy 1977, ovaries remain Mammogram 09/2016 - saw Dr. Jamal Collin. H/o breast cancer followed closely by Dr. Jamal Collin until he retired 2018 - would like yearly breast exams at our office.  Dexa - 08/2012 - WNL Flu yearly Pneumovax 06/2005. prevnar 2015 Td 06/2005  zostavax 04/2005  shingrix - discussed Advanced directives: Scanned into chart 10/2014. [name] husband then Ray Church and Wille Glaser are [facility]. Does not want prolonged life support if terminal or irreversible condition. Seat belt use discussed.  Sunscreen use discussed. No changing moles on skin.  Non smoker.  Alcohol - once a month Dentist Q6 mo Eye exam - yearly  Lives with husband, no pets  Grown children  Occupation: retired, varied jobs Veterinary surgeon  Activity: gym 3d/wk, active mentally with crosswords  Diet: good water, fruits/vegetables daily   Relevant past medical, surgical, family and social history reviewed and updated as indicated. Interim medical history since our last visit  reviewed. Allergies and medications reviewed and updated. Outpatient Medications Prior to Visit  Medication Sig Dispense Refill  . acetaminophen (TYLENOL 8 HOUR ARTHRITIS PAIN) 650 MG CR tablet Take 1,300 mg by mouth every 8 (eight) hours as needed for pain.    . Ascorbic Acid (VITAMIN C PO) Take 1 tablet by mouth daily as needed (for cold symptoms).     . beta carotene w/minerals (OCUVITE) tablet Take 1 tablet by mouth every other day.    . Blood Glucose Monitoring Suppl (ONE TOUCH ULTRA 2) w/Device KIT Use as directed 1 each 0  . Calcium-Phosphorus-Vitamin D (CITRACAL +D3 PO) Take 1 tablet by mouth daily with breakfast.     . clobetasol (TEMOVATE) 0.05 % external solution Apply 1 application topically 2 (two) times daily as needed (for rash on scalp).    . Coenzyme Q10 (CO Q-10) 200 MG CAPS Take 200 mg by mouth every morning.     . docusate sodium (COLACE) 100 MG capsule Take 100 mg by mouth at bedtime.    [name] doxycycline (ADOXA) 50 MG tablet Take 50 mg by mouth daily as needed.    [name] estradiol (ESTRACE) 0.1 MG/GM vaginal cream Place 1 Applicatorful vaginally 2 (two) times a week. (Patient taking differently: Place 1 Applicatorful vaginally once a week. ) 42.5 g 3  . glucose blood test strip Use as instructed (ultrablue 2 strips) E11.9 100 each 3  . hydrochlorothiazide (MICROZIDE) 12.5 MG capsule Take 1 capsule (12.5 mg total)  by mouth daily. 90 capsule 3  . Krill Oil 500 MG CAPS Take 500 mg by mouth every morning.     . Lancets (ONETOUCH ULTRASOFT) lancets Use as instructed E11.9 100 each 3  . losartan (COZAAR) 100 MG tablet Take 1 tablet (100 mg total) by mouth daily. (Patient taking differently: Take 50 mg by mouth daily. ) 90 tablet 3  . metroNIDAZOLE (METROCREAM) 0.75 % cream Apply 1 application topically at bedtime as needed. For rosacea.  3  . naproxen sodium (ALEVE) 220 MG tablet Take 220 mg by mouth daily as needed.    . Probiotic Product (PROBIOTIC DAILY PO) Take 1 capsule by mouth at  bedtime.     . triamcinolone cream (KENALOG) 0.1 % Apply 1 application topically 2 (two) times daily as needed.     . Turmeric 500 MG CAPS Take 500 mg by mouth at bedtime.    . Zinc Sulfate (ZINC 15 PO) Take 15 mg by mouth as needed (with cold symptoms). Reported on 08/16/2015    . clobetasol cream (TEMOVATE) 3.38 % Apply 1 application topically 2 (two) times daily.    [name] lovastatin (MEVACOR) 40 MG tablet TAKE ONE TABLET BY MOUTH EVERY NIGHT AT BEDTIME 90 tablet 2   No facility-administered medications prior to visit.      Per HPI unless specifically indicated in ROS section below Review of Systems  Constitutional: Negative for activity change, appetite change, chills, fatigue, fever and unexpected weight change.  HENT: Negative for hearing loss.   Eyes: Negative for visual disturbance.  Respiratory: Negative for cough, chest tightness, shortness of breath and wheezing.   Cardiovascular: Negative for chest pain, palpitations and leg swelling.  Gastrointestinal: Positive for constipation. Negative for abdominal distention, abdominal pain, blood in stool, diarrhea, nausea and vomiting.  Genitourinary: Negative for difficulty urinating and hematuria.  Musculoskeletal: Negative for arthralgias, myalgias and neck pain.  Skin: Negative for rash.  Neurological: Negative for dizziness, seizures, syncope and headaches.  Hematological: Negative for adenopathy. Does not bruise/bleed easily.  Psychiatric/Behavioral: Negative for dysphoric mood. The patient is not nervous/anxious.       Objective:    BP 140/70 (BP Location: Left Arm, Patient Position: Sitting, Cuff Size: Normal)   Pulse 65   Temp 98.2 F (36.8 C) (Oral)   Ht 5' 5.5" (1.664 m)   Wt 154 lb (69.9 kg)   SpO2 98%   BMI 25.24 kg/m   Wt Readings from Last 3 Encounters:  10/19/17 154 lb (69.9 kg)  10/14/17 153 lb 12 oz (69.7 kg)  10/12/17 151 lb 2 oz (68.5 kg)    Physical Exam  Constitutional: She is oriented to person, place,  and time. She appears well-developed and well-nourished. No distress.  HENT:  Head: Normocephalic and atraumatic.  Right Ear: Hearing, tympanic membrane, external ear and ear canal normal.  Left Ear: Hearing, tympanic membrane, external ear and ear canal normal.  Nose: Nose normal.  Mouth/Throat: Uvula is midline, oropharynx is clear and moist and mucous membranes are normal. No oropharyngeal exudate, posterior oropharyngeal edema or posterior oropharyngeal erythema.  Eyes: Pupils are equal, round, and reactive to light. Conjunctivae and EOM are normal. No scleral icterus.  Neck: Normal range of motion. Neck supple. Carotid bruit is not present. No thyromegaly present.  Cardiovascular: Normal rate, regular rhythm, normal heart sounds and intact distal pulses.  No murmur heard. Pulses:      Radial pulses are 2+ on the right side, and 2+ on the left side.  Pulmonary/Chest: Effort normal and breath sounds normal. No respiratory distress. She has no wheezes. She has no rales. Right breast exhibits no inverted nipple, no mass, no nipple discharge, no skin change and no tenderness. Left breast exhibits no inverted nipple, no mass, no nipple discharge, no skin change and no tenderness.  Scarring L lateral breast at site of prior surgery  Abdominal: Soft. Bowel sounds are normal. She exhibits no distension and no mass. There is no tenderness. There is no rebound and no guarding.  Musculoskeletal: Normal range of motion. She exhibits no edema.  Lymphadenopathy:       Head (right side): No submental, no submandibular, no tonsillar, no preauricular and no posterior auricular adenopathy present.       Head (left side): No submental, no submandibular, no tonsillar, no preauricular and no posterior auricular adenopathy present.    She has no cervical adenopathy.    She has no axillary adenopathy.       Right axillary: No lateral adenopathy present.       Left axillary: No lateral adenopathy present.       Right: No supraclavicular adenopathy present.       Left: No supraclavicular adenopathy present.  Neurological: She is alert and oriented to person, place, and time.  CN grossly intact, station and gait intact  Skin: Skin is warm and dry. No rash noted.  Psychiatric: She has a normal mood and affect. Her behavior is normal. Judgment and thought content normal.  Nursing note and vitals reviewed.  Results for orders placed or performed in visit on 10/19/17  POCT Urinalysis Dipstick (Automated)  Result Value Ref Range   Color, UA yellow    Clarity, UA clear    Glucose, UA Negative Negative   Bilirubin, UA negative    Ketones, UA negative    Spec Grav, UA 1.015 1.010 - 1.025   Blood, UA negaive    pH, UA 6.0 5.0 - 8.0   Protein, UA Negative Negative   Urobilinogen, UA 0.2 0.2 or 1.0 E.U./dL   Nitrite, UA negative    Leukocytes, UA Negative Negative      Assessment & Plan:   Problem List Items Addressed This Visit    Vulvar dermatitis    Has seen GYN. PRN clotrimazole refilled.       Lesion of left native kidney    Incidental finding on lumbar MRI. UA today - normal.  Discussed options - further imaging (MRI, [place]) vs uro referral. Will start with renal US, determine need for further eval pending results.      Relevant Orders   US Renal   Hypertension    Chronic, stable. Continue current regimen.  HCTZ recently started in place of amlodipine by cardiology - will check BMP today.       Relevant Medications   lovastatin (MEVACOR) 40 MG tablet   Other Relevant Orders   Basic metabolic panel   POCT Urinalysis Dipstick (Automated) (Completed)   Hyperlipidemia    Chronic, stable. Continue statin. The ASCVD Risk score Mikey Bussing DC Jr., et al., 2013) failed to calculate for the following reasons:   The 2013 ASCVD risk score is only valid for ages 75 to 57      Relevant Medications   lovastatin (MEVACOR) 40 MG tablet   History of breast cancer    Stable breast exam and mammo        Health maintenance examination - Primary    Preventative protocols reviewed and updated unless pt declined.  Discussed healthy diet and lifestyle.       Diabetes mellitus without complication (HCC)    Chronic, diet controlled. Reviewed slight increased A1c this year.       Relevant Medications   lovastatin (MEVACOR) 40 MG tablet   CKD stage 3 due to type 2 diabetes mellitus (HCC)    Chronic, stable. Encouraged good hydration status.      Relevant Medications   lovastatin (MEVACOR) 40 MG tablet       Meds ordered this encounter  Medications  . lovastatin (MEVACOR) 40 MG tablet    Sig: Take 1 tablet (40 mg total) by mouth at bedtime.    Dispense:  90 tablet    Refill:  3  . clobetasol cream (TEMOVATE) 0.05 %    Sig: Apply 1 application topically 2 (two) times daily.    Dispense:  30 g    Refill:  1   Orders Placed This Encounter  Procedures  . US Renal    Standing Status:   Future    Standing Expiration Date:   12/20/2018    Order Specific Question:   Reason for Exam (SYMPTOM  OR DIAGNOSIS REQUIRED)    Answer:   eval L kidney lesion    Order Specific Question:   Preferred imaging location?    Answer:   [facility]  . Basic metabolic panel  . POCT Urinalysis Dipstick (Automated)    Follow up plan: Return in about 1 year (around 10/20/2018).  [name], MD

## 2017-10-19 NOTE — Patient Instructions (Addendum)
We will order kidney ultrasound to further evaluate spot on left kidney.  Urinalysis today.  Labs today (Dr Fletcher Anon) Your mammogram will be due 10/27/2017. If you haven't heard from them by then, call to schedule appointment.  If interested, check with pharmacy about new 2 shot shingles series (shingrix).   Health Maintenance, Female Adopting a healthy lifestyle and getting preventive care can go a long way to promote health and wellness. Talk with your health care provider about what schedule of regular examinations is right for you. This is a good chance for you to check in with your provider about disease prevention and staying healthy. In between checkups, there are plenty of things you can do on your own. Experts have done a lot of research about which lifestyle changes and preventive measures are most likely to keep you healthy. Ask your health care provider for more information. Weight and diet Eat a healthy diet  Be sure to include plenty of vegetables, fruits, low-fat dairy products, and lean protein.  Do not eat a lot of foods high in solid fats, added sugars, or salt.  Get regular exercise. This is one of the most important things you can do for your health. ? Most adults should exercise for at least 150 minutes each week. The exercise should increase your heart rate and make you sweat (moderate-intensity exercise). ? Most adults should also do strengthening exercises at least twice a week. This is in addition to the moderate-intensity exercise.  Maintain a healthy weight  Body mass index (BMI) is a measurement that can be used to identify possible weight problems. It estimates body fat based on height and weight. Your health care provider can help determine your BMI and help you achieve or maintain a healthy weight.  For females 57 years of age and older: ? A BMI below 18.5 is considered underweight. ? A BMI of 18.5 to 24.9 is normal. ? A BMI of 25 to 29.9 is considered  overweight. ? A BMI of 30 and above is considered obese.  Watch levels of cholesterol and blood lipids  You should start having your blood tested for lipids and cholesterol at 82 years of age, then have this test every 5 years.  You may need to have your cholesterol levels checked more often if: ? Your lipid or cholesterol levels are high. ? You are older than 82 years of age. ? You are at high risk for heart disease.  Cancer screening Lung Cancer  Lung cancer screening is recommended for adults 28-31 years old who are at high risk for lung cancer because of a history of smoking.  A yearly low-dose CT scan of the lungs is recommended for people who: ? Currently smoke. ? Have quit within the past 15 years. ? Have at least a 30-pack-year history of smoking. A pack year is smoking an average of one pack of cigarettes a day for 1 year.  Yearly screening should continue until it has been 15 years since you quit.  Yearly screening should stop if you develop a health problem that would prevent you from having lung cancer treatment.  Breast Cancer  Practice breast self-awareness. This means understanding how your breasts normally appear and feel.  It also means doing regular breast self-exams. Let your health care provider know about any changes, no matter how small.  If you are in your 20s or 30s, you should have a clinical breast exam (CBE) by a health care provider every 1-3 years as  part of a regular health exam.  If you are 40 or older, have a CBE every year. Also consider having a breast X-ray (mammogram) every year.  If you have a family history of breast cancer, talk to your health care provider about genetic screening.  If you are at high risk for breast cancer, talk to your health care provider about having an MRI and a mammogram every year.  Breast cancer gene (BRCA) assessment is recommended for women who have family members with BRCA-related cancers. BRCA-related cancers  include: ? Breast. ? Ovarian. ? Tubal. ? Peritoneal cancers.  Results of the assessment will determine the need for genetic counseling and BRCA1 and BRCA2 testing.  Cervical Cancer Your health care provider may recommend that you be screened regularly for cancer of the pelvic organs (ovaries, uterus, and vagina). This screening involves a pelvic examination, including checking for microscopic changes to the surface of your cervix (Pap test). You may be encouraged to have this screening done every 3 years, beginning at age 66.  For women ages 23-65, health care providers may recommend pelvic exams and Pap testing every 3 years, or they may recommend the Pap and pelvic exam, combined with testing for human papilloma virus (HPV), every 5 years. Some types of HPV increase your risk of cervical cancer. Testing for HPV may also be done on women of any age with unclear Pap test results.  Other health care providers may not recommend any screening for nonpregnant women who are considered low risk for pelvic cancer and who do not have symptoms. Ask your health care provider if a screening pelvic exam is right for you.  If you have had past treatment for cervical cancer or a condition that could lead to cancer, you need Pap tests and screening for cancer for at least 20 years after your treatment. If Pap tests have been discontinued, your risk factors (such as having a new sexual partner) need to be reassessed to determine if screening should resume. Some women have medical problems that increase the chance of getting cervical cancer. In these cases, your health care provider may recommend more frequent screening and Pap tests.  Colorectal Cancer  This type of cancer can be detected and often prevented.  Routine colorectal cancer screening usually begins at 82 years of age and continues through 82 years of age.  Your health care provider may recommend screening at an earlier age if you have risk factors  for colon cancer.  Your health care provider may also recommend using home test kits to check for hidden blood in the stool.  A small camera at the end of a tube can be used to examine your colon directly (sigmoidoscopy or colonoscopy). This is done to check for the earliest forms of colorectal cancer.  Routine screening usually begins at age 84.  Direct examination of the colon should be repeated every 5-10 years through 82 years of age. However, you may need to be screened more often if early forms of precancerous polyps or small growths are found.  Skin Cancer  Check your skin from head to toe regularly.  Tell your health care provider about any new moles or changes in moles, especially if there is a change in a mole's shape or color.  Also tell your health care provider if you have a mole that is larger than the size of a pencil eraser.  Always use sunscreen. Apply sunscreen liberally and repeatedly throughout the day.  Protect yourself by wearing  long sleeves, pants, a wide-brimmed hat, and sunglasses whenever you are outside.  Heart disease, diabetes, and high blood pressure  High blood pressure causes heart disease and increases the risk of stroke. High blood pressure is more likely to develop in: ? People who have blood pressure in the high end of the normal range (130-139/85-89 mm Hg). ? People who are overweight or obese. ? People who are African American.  If you are 84-53 years of age, have your blood pressure checked every 3-5 years. If you are 36 years of age or older, have your blood pressure checked every year. You should have your blood pressure measured twice-once when you are at a hospital or clinic, and once when you are not at a hospital or clinic. Record the average of the two measurements. To check your blood pressure when you are not at a hospital or clinic, you can use: ? An automated blood pressure machine at a pharmacy. ? A home blood pressure monitor.  If  you are between 81 years and 46 years old, ask your health care provider if you should take aspirin to prevent strokes.  Have regular diabetes screenings. This involves taking a blood sample to check your fasting blood sugar level. ? If you are at a normal weight and have a low risk for diabetes, have this test once every three years after 82 years of age. ? If you are overweight and have a high risk for diabetes, consider being tested at a younger age or more often. Preventing infection Hepatitis B  If you have a higher risk for hepatitis B, you should be screened for this virus. You are considered at high risk for hepatitis B if: ? You were born in a country where hepatitis B is common. Ask your health care provider which countries are considered high risk. ? Your parents were born in a high-risk country, and you have not been immunized against hepatitis B (hepatitis B vaccine). ? You have HIV or AIDS. ? You use needles to inject street drugs. ? You live with someone who has hepatitis B. ? You have had sex with someone who has hepatitis B. ? You get hemodialysis treatment. ? You take certain medicines for conditions, including cancer, organ transplantation, and autoimmune conditions.  Hepatitis C  Blood testing is recommended for: ? Everyone born from [birth year] through 1963. ? Anyone with known risk factors for hepatitis C.  Sexually transmitted infections (STIs)  You should be screened for sexually transmitted infections (STIs) including gonorrhea and chlamydia if: ? You are sexually active and are younger than 82 years of age. ? You are older than 82 years of age and your health care provider tells you that you are at risk for this type of infection. ? Your sexual activity has changed since you were last screened and you are at an increased risk for chlamydia or gonorrhea. Ask your health care provider if you are at risk.  If you do not have HIV, but are at risk, it may be recommended  that you take a prescription medicine daily to prevent HIV infection. This is called pre-exposure prophylaxis (PrEP). You are considered at risk if: ? You are sexually active and do not regularly use condoms or know the HIV status of your partner(s). ? You take drugs by injection. ? You are sexually active with a partner who has HIV.  Talk with your health care provider about whether you are at high risk of being infected with HIV.  If you choose to begin PrEP, you should first be tested for HIV. You should then be tested every 3 months for as long as you are taking PrEP. Pregnancy  If you are premenopausal and you may become pregnant, ask your health care provider about preconception counseling.  If you may become pregnant, take 400 to 800 micrograms (mcg) of folic acid every day.  If you want to prevent pregnancy, talk to your health care provider about birth control (contraception). Osteoporosis and menopause  Osteoporosis is a disease in which the bones lose minerals and strength with aging. This can result in serious bone fractures. Your risk for osteoporosis can be identified using a bone density scan.  If you are 8 years of age or older, or if you are at risk for osteoporosis and fractures, ask your health care provider if you should be screened.  Ask your health care provider whether you should take a calcium or vitamin D supplement to lower your risk for osteoporosis.  Menopause may have certain physical symptoms and risks.  Hormone replacement therapy may reduce some of these symptoms and risks. Talk to your health care provider about whether hormone replacement therapy is right for you. Follow these instructions at home:  Schedule regular health, dental, and eye exams.  Stay current with your immunizations.  Do not use any tobacco products including cigarettes, chewing tobacco, or electronic cigarettes.  If you are pregnant, do not drink alcohol.  If you are  breastfeeding, limit how much and how often you drink alcohol.  Limit alcohol intake to no more than 1 drink per day for nonpregnant women. One drink equals 12 ounces of beer, 5 ounces of wine, or 1 ounces of hard liquor.  Do not use street drugs.  Do not share needles.  Ask your health care provider for help if you need support or information about quitting drugs.  Tell your health care provider if you often feel depressed.  Tell your health care provider if you have ever been abused or do not feel safe at home. This information is not intended to replace advice given to you by your health care provider. Make sure you discuss any questions you have with your health care provider. Document Released: 09/01/2010 Document Revised: 07/25/2015 Document Reviewed: 11/20/2014 Elsevier Interactive Patient Education  [employer].

## 2017-10-20 DIAGNOSIS — M9904 Segmental and somatic dysfunction of sacral region: Secondary | ICD-10-CM | POA: Diagnosis not present

## 2017-10-20 DIAGNOSIS — M9903 Segmental and somatic dysfunction of lumbar region: Secondary | ICD-10-CM | POA: Diagnosis not present

## 2017-10-20 DIAGNOSIS — N289 Disorder of kidney and ureter, unspecified: Secondary | ICD-10-CM | POA: Insufficient documentation

## 2017-10-20 DIAGNOSIS — M5442 Lumbago with sciatica, left side: Secondary | ICD-10-CM | POA: Diagnosis not present

## 2017-10-20 DIAGNOSIS — M5441 Lumbago with sciatica, right side: Secondary | ICD-10-CM | POA: Diagnosis not present

## 2017-10-20 LAB — BASIC METABOLIC PANEL
BUN: 26 mg/dL — AB (ref 6–23)
CO2: 28 mEq/L (ref 19–32)
Calcium: 10.3 mg/dL (ref 8.4–10.5)
Chloride: 101 mEq/L (ref 96–112)
Creatinine, Ser: 1.21 mg/dL — ABNORMAL HIGH (ref 0.40–1.20)
GFR: 45.22 mL/min — AB (ref >60.00)
GLUCOSE: 115 mg/dL — AB (ref 70–99)
POTASSIUM: 4.5 meq/L (ref 3.5–5.1)
SODIUM: 140 meq/L (ref 135–145)

## 2017-10-20 NOTE — Assessment & Plan Note (Signed)
Preventative protocols reviewed and updated unless pt declined. Discussed healthy diet and lifestyle.  

## 2017-10-20 NOTE — Assessment & Plan Note (Signed)
Stable breast exam and mammo

## 2017-10-20 NOTE — Assessment & Plan Note (Signed)
Chronic, diet controlled. Reviewed slight increased A1c this year.

## 2017-10-20 NOTE — Assessment & Plan Note (Addendum)
Chronic, stable. Continue current regimen.  HCTZ recently started in place of amlodipine by cardiology - will check BMP today.

## 2017-10-20 NOTE — Assessment & Plan Note (Signed)
Incidental finding on lumbar MRI. UA today - normal.  Discussed options - further imaging (MRI, [place]) vs uro referral. Will start with renal US, determine need for further eval pending results.

## 2017-10-20 NOTE — Assessment & Plan Note (Signed)
Chronic, stable. Encouraged good hydration status.  

## 2017-10-20 NOTE — Assessment & Plan Note (Addendum)
Has seen GYN. PRN clotrimazole refilled.

## 2017-10-20 NOTE — Assessment & Plan Note (Addendum)
Chronic, stable. Continue statin. The ASCVD Risk score (Goff DC Jr., et al., 2013) failed to calculate for the following reasons:   The 2013 ASCVD risk score is only valid for ages 40 to 79  

## 2017-10-21 ENCOUNTER — Other Ambulatory Visit: Payer: Self-pay | Admitting: Family Medicine

## 2017-10-21 ENCOUNTER — Ambulatory Visit
Admission: RE | Admit: 2017-10-21 | Discharge: 2017-10-21 | Disposition: A | Discharge status: Home / Self Care | Payer: PPO | Source: Ambulatory Visit | Attending: Family Medicine | Admitting: Family Medicine

## 2017-10-21 DIAGNOSIS — M4727 Other spondylosis with radiculopathy, lumbosacral region: Secondary | ICD-10-CM

## 2017-10-21 DIAGNOSIS — M5116 Intervertebral disc disorders with radiculopathy, lumbar region: Secondary | ICD-10-CM | POA: Diagnosis not present

## 2017-10-21 MED ORDER — IOPAMIDOL (ISOVUE-M 200) INJECTION 41%
1.0000 mL | Freq: Once | INTRAMUSCULAR | Status: AC
  Administered 2017-10-21: 1 mL via EPIDURAL

## 2017-10-21 MED ORDER — METHYLPREDNISOLONE ACETATE 40 MG/ML INJ SUSP (RADIOLOG
120.0000 mg | Freq: Once | INTRAMUSCULAR | Status: AC
  Administered 2017-10-21: 120 mg via EPIDURAL

## 2017-10-21 NOTE — Discharge Instructions (Signed)

## 2017-10-24 NOTE — Progress Notes (Signed)
I reviewed health advisor's note, was available for consultation, and agree with documentation and plan.  

## 2017-10-27 DIAGNOSIS — M62838 Other muscle spasm: Secondary | ICD-10-CM | POA: Diagnosis not present

## 2017-10-27 DIAGNOSIS — M546 Pain in thoracic spine: Secondary | ICD-10-CM | POA: Diagnosis not present

## 2017-10-29 ENCOUNTER — Emergency Department
Admission: EM | Admit: 2017-10-29 | Discharge: 2017-10-29 | Disposition: A | Discharge status: Home / Self Care | Payer: PPO | Attending: Emergency Medicine | Admitting: Emergency Medicine

## 2017-10-29 ENCOUNTER — Other Ambulatory Visit: Payer: Self-pay

## 2017-10-29 DIAGNOSIS — Z85828 Personal history of other malignant neoplasm of skin: Secondary | ICD-10-CM | POA: Diagnosis not present

## 2017-10-29 DIAGNOSIS — M545 Low back pain: Secondary | ICD-10-CM | POA: Insufficient documentation

## 2017-10-29 DIAGNOSIS — M5489 Other dorsalgia: Secondary | ICD-10-CM | POA: Diagnosis not present

## 2017-10-29 DIAGNOSIS — E1122 Type 2 diabetes mellitus with diabetic chronic kidney disease: Secondary | ICD-10-CM | POA: Insufficient documentation

## 2017-10-29 DIAGNOSIS — Z96652 Presence of left artificial knee joint: Secondary | ICD-10-CM | POA: Diagnosis not present

## 2017-10-29 DIAGNOSIS — M62830 Muscle spasm of back: Secondary | ICD-10-CM | POA: Insufficient documentation

## 2017-10-29 DIAGNOSIS — I129 Hypertensive chronic kidney disease with stage 1 through stage 4 chronic kidney disease, or unspecified chronic kidney disease: Secondary | ICD-10-CM | POA: Diagnosis not present

## 2017-10-29 DIAGNOSIS — Z853 Personal history of malignant neoplasm of breast: Secondary | ICD-10-CM | POA: Diagnosis not present

## 2017-10-29 DIAGNOSIS — N183 Chronic kidney disease, stage 3 (moderate): Secondary | ICD-10-CM | POA: Diagnosis not present

## 2017-10-29 DIAGNOSIS — Z79899 Other long term (current) drug therapy: Secondary | ICD-10-CM | POA: Diagnosis not present

## 2017-10-29 MED ORDER — GABAPENTIN 100 MG PO CAPS: 100.0000 mg | ORAL_CAPSULE | Freq: Two times a day (BID) | ORAL | 0 refills | Status: DC
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
## 2017-10-29 NOTE — ED Triage Notes (Signed)
Pt brought in from home via EMS for right sided back pain. Experiencing for 2 weeks. Hx spinal stenosis. Pt reports pain changed today when she began having shooting pains upon ambulation.

## 2017-11-02 ENCOUNTER — Other Ambulatory Visit: Payer: Self-pay | Admitting: Certified Nurse Midwife

## 2017-11-02 ENCOUNTER — Other Ambulatory Visit: Payer: Self-pay | Admitting: Family Medicine

## 2017-11-02 DIAGNOSIS — Z1231 Encounter for screening mammogram for malignant neoplasm of breast: Secondary | ICD-10-CM

## 2017-11-03 ENCOUNTER — Ambulatory Visit
Admission: RE | Admit: 2017-11-03 | Discharge: 2017-11-03 | Disposition: A | Discharge status: Home / Self Care | Payer: PPO | Source: Ambulatory Visit | Attending: Family Medicine | Admitting: Family Medicine

## 2017-11-03 DIAGNOSIS — N2889 Other specified disorders of kidney and ureter: Secondary | ICD-10-CM | POA: Insufficient documentation

## 2017-11-03 DIAGNOSIS — N281 Cyst of kidney, acquired: Secondary | ICD-10-CM | POA: Diagnosis not present

## 2017-11-03 DIAGNOSIS — N289 Disorder of kidney and ureter, unspecified: Secondary | ICD-10-CM

## 2017-11-06 ENCOUNTER — Encounter: Payer: Self-pay | Admitting: Family Medicine

## 2017-11-06 ENCOUNTER — Other Ambulatory Visit: Payer: Self-pay | Admitting: Family Medicine

## 2017-11-06 DIAGNOSIS — N2889 Other specified disorders of kidney and ureter: Secondary | ICD-10-CM

## 2017-11-06 DIAGNOSIS — N281 Cyst of kidney, acquired: Secondary | ICD-10-CM | POA: Insufficient documentation

## 2017-11-06 DIAGNOSIS — N289 Disorder of kidney and ureter, unspecified: Secondary | ICD-10-CM

## 2017-11-10 DIAGNOSIS — M546 Pain in thoracic spine: Secondary | ICD-10-CM | POA: Diagnosis not present

## 2017-11-10 DIAGNOSIS — M62838 Other muscle spasm: Secondary | ICD-10-CM | POA: Diagnosis not present

## 2017-11-10 DIAGNOSIS — M6281 Muscle weakness (generalized): Secondary | ICD-10-CM | POA: Diagnosis not present

## 2017-11-16 DIAGNOSIS — M62838 Other muscle spasm: Secondary | ICD-10-CM | POA: Diagnosis not present

## 2017-11-18 ENCOUNTER — Ambulatory Visit
Admission: RE | Admit: 2017-11-18 | Discharge: 2017-11-18 | Disposition: A | Discharge status: Home / Self Care | Payer: PPO | Source: Ambulatory Visit | Attending: Family Medicine | Admitting: Family Medicine

## 2017-11-18 DIAGNOSIS — Z1231 Encounter for screening mammogram for malignant neoplasm of breast: Secondary | ICD-10-CM | POA: Insufficient documentation

## 2017-11-19 ENCOUNTER — Other Ambulatory Visit: Payer: Self-pay | Admitting: Family Medicine

## 2017-11-19 DIAGNOSIS — N6320 Unspecified lump in the left breast, unspecified quadrant: Secondary | ICD-10-CM

## 2017-11-19 DIAGNOSIS — R928 Other abnormal and inconclusive findings on diagnostic imaging of breast: Secondary | ICD-10-CM

## 2017-11-20 ENCOUNTER — Ambulatory Visit
Admission: RE | Admit: 2017-11-20 | Discharge: 2017-11-20 | Disposition: A | Discharge status: Home / Self Care | Payer: PPO | Source: Ambulatory Visit | Attending: Family Medicine | Admitting: Family Medicine

## 2017-11-20 DIAGNOSIS — N2889 Other specified disorders of kidney and ureter: Secondary | ICD-10-CM | POA: Diagnosis not present

## 2017-11-20 DIAGNOSIS — N281 Cyst of kidney, acquired: Secondary | ICD-10-CM | POA: Insufficient documentation

## 2017-11-20 DIAGNOSIS — N289 Disorder of kidney and ureter, unspecified: Secondary | ICD-10-CM | POA: Insufficient documentation

## 2017-11-20 MED ORDER — GADOBENATE DIMEGLUMINE 529 MG/ML IV SOLN
7.0000 mL | Freq: Once | INTRAVENOUS | Status: AC | PRN
  Administered 2017-11-20: 7 mL via INTRAVENOUS

## 2017-11-21 ENCOUNTER — Ambulatory Visit
Admission: RE | Admit: 2017-11-21 | Discharge: 2017-11-21 | Disposition: A | Discharge status: Home / Self Care | Payer: PPO | Source: Ambulatory Visit | Attending: Family Medicine | Admitting: Family Medicine

## 2017-11-23 ENCOUNTER — Ambulatory Visit (INDEPENDENT_AMBULATORY_CARE_PROVIDER_SITE_OTHER): Payer: PPO

## 2017-11-23 DIAGNOSIS — R002 Palpitations: Secondary | ICD-10-CM | POA: Diagnosis not present

## 2017-11-26 ENCOUNTER — Ambulatory Visit
Admission: RE | Admit: 2017-11-26 | Discharge: 2017-11-26 | Disposition: A | Discharge status: Home / Self Care | Payer: PPO | Source: Ambulatory Visit | Attending: Family Medicine | Admitting: Family Medicine

## 2017-11-26 ENCOUNTER — Ambulatory Visit
Admission: RE | Admit: 2017-11-26 | Discharge: 2017-11-26 | Disposition: A | Discharge status: Home / Self Care | Payer: PPO | Source: Ambulatory Visit | Attending: Cardiovascular Disease | Admitting: Cardiovascular Disease

## 2017-11-26 DIAGNOSIS — R928 Other abnormal and inconclusive findings on diagnostic imaging of breast: Secondary | ICD-10-CM | POA: Insufficient documentation

## 2017-11-26 DIAGNOSIS — N6320 Unspecified lump in the left breast, unspecified quadrant: Secondary | ICD-10-CM

## 2017-11-26 DIAGNOSIS — N6322 Unspecified lump in the left breast, upper inner quadrant: Secondary | ICD-10-CM | POA: Diagnosis not present

## 2017-11-26 DIAGNOSIS — Z96652 Presence of left artificial knee joint: Secondary | ICD-10-CM | POA: Diagnosis not present

## 2017-11-26 DIAGNOSIS — M6281 Muscle weakness (generalized): Secondary | ICD-10-CM | POA: Diagnosis not present

## 2017-11-29 ENCOUNTER — Other Ambulatory Visit: Payer: Self-pay | Admitting: Family Medicine

## 2017-11-29 DIAGNOSIS — R928 Other abnormal and inconclusive findings on diagnostic imaging of breast: Secondary | ICD-10-CM

## 2017-11-29 DIAGNOSIS — Z96652 Presence of left artificial knee joint: Secondary | ICD-10-CM | POA: Diagnosis not present

## 2017-11-29 DIAGNOSIS — M6281 Muscle weakness (generalized): Secondary | ICD-10-CM | POA: Diagnosis not present

## 2017-11-29 DIAGNOSIS — N6320 Unspecified lump in the left breast, unspecified quadrant: Secondary | ICD-10-CM

## 2017-12-06 ENCOUNTER — Ambulatory Visit
Admission: RE | Admit: 2017-12-06 | Discharge: 2017-12-06 | Disposition: A | Discharge status: Home / Self Care | Payer: PPO | Source: Ambulatory Visit | Attending: Family Medicine | Admitting: Family Medicine

## 2017-12-06 DIAGNOSIS — R928 Other abnormal and inconclusive findings on diagnostic imaging of breast: Secondary | ICD-10-CM

## 2017-12-06 DIAGNOSIS — N6322 Unspecified lump in the left breast, upper inner quadrant: Secondary | ICD-10-CM | POA: Diagnosis not present

## 2017-12-06 DIAGNOSIS — N6320 Unspecified lump in the left breast, unspecified quadrant: Secondary | ICD-10-CM | POA: Diagnosis not present

## 2017-12-06 DIAGNOSIS — C50212 Malignant neoplasm of upper-inner quadrant of left female breast: Secondary | ICD-10-CM | POA: Diagnosis not present

## 2017-12-06 HISTORY — PX: BREAST BIOPSY: SHX20

## 2017-12-08 ENCOUNTER — Encounter: Payer: Self-pay | Admitting: *Deleted

## 2017-12-08 NOTE — Progress Notes (Signed)
  Oncology Nurse Navigator Documentation  Navigator Location: [facility] (12/08/17 1600) Referral date to RadOnc/MedOnc: 12/08/17 (12/08/17 1600) )Navigator Encounter Type: Introductory phone call (12/08/17 1600)   Abnormal Finding Date: 11/26/17 (12/08/17 1600) Confirmed Diagnosis Date: 12/07/17 (12/08/17 1600)                   Barriers/Navigation Needs: Coordination of Care (12/08/17 1600)   Interventions: Coordination of Care (12/08/17 1600)   Coordination of Care: Appts (12/08/17 1600)                  Time Spent with Patient: 30 (12/08/17 1600)   Called patient to establish navigation services.  She is newly diagnosed with left breast cancer.  Patient states she had left breast cancer 7 years ago.  States she had "the 5 days of radiation" and could not tolerate the pill, so she didn't take it.  She would like to see Dr. [name] for a surgical consult.  I have sent Ronnie Doss, in his office a message to assist with scheduling the patient.  She does not want a medical oncology consult at this time.  I asked if she would like educational literature, and she did not want any literature.  She is to call if she has any questions or needs.

## 2017-12-10 LAB — SURGICAL PATHOLOGY

## 2017-12-14 ENCOUNTER — Other Ambulatory Visit: Payer: Self-pay | Admitting: Family Medicine

## 2017-12-15 ENCOUNTER — Telehealth: Payer: Self-pay | Admitting: *Deleted

## 2017-12-15 DIAGNOSIS — I471 Supraventricular tachycardia: Secondary | ICD-10-CM

## 2017-12-15 DIAGNOSIS — I493 Ventricular premature depolarization: Secondary | ICD-10-CM

## 2017-12-15 NOTE — Telephone Encounter (Signed)
Left a message for the patient to call back.  

## 2017-12-15 NOTE — Telephone Encounter (Signed)
-----   Message from Wellington Hampshire, MD sent at 12/13/2017  2:35 PM EDT ----- Holter monitor showed frequent PVCs and short runs of SVT.  I recommend scheduling an echocardiogram to ensure that her ejection fraction is normal. Schedule follow-up with me after echo to discuss results and see if further treatment is needed.

## 2017-12-16 NOTE — Telephone Encounter (Signed)
Left a message for the patient to call back.  

## 2017-12-20 ENCOUNTER — Ambulatory Visit: Payer: Self-pay | Admitting: General Surgery

## 2017-12-20 DIAGNOSIS — C50212 Malignant neoplasm of upper-inner quadrant of left female breast: Secondary | ICD-10-CM | POA: Diagnosis not present

## 2017-12-20 DIAGNOSIS — Z170 Estrogen receptor positive status [ER+]: Secondary | ICD-10-CM | POA: Diagnosis not present

## 2017-12-20 NOTE — Telephone Encounter (Signed)
I spoke with the patient regarding her holter results.  She is agreeable with scheduling an echo and follow up with Dr. Fletcher Anon after that to discuss further.   Will forward a message to scheduling and Dr. Tyrell Antonio nurse, Lattie Haw to make them aware. ? If she may need a work in appointment with Dr. Fletcher Anon after the echo.

## 2017-12-20 NOTE — Telephone Encounter (Signed)
Patient returning our call  Please advise

## 2017-12-21 ENCOUNTER — Telehealth: Payer: Self-pay | Admitting: Cardiovascular Disease

## 2017-12-21 NOTE — Telephone Encounter (Signed)
   [facility] Pre-operative Risk Assessment    Request for surgical clearance:  1. What type of surgery is being performed? MASTECTOMY  2. When is this surgery scheduled? NOT LISTED  3. What type of clearance is required (medical clearance vs. Pharmacy clearance to hold med vs. Both)? MEDICAL/CARDIAC  4. Are there any medications that need to be held prior to surgery and how long? NOT LISTED  Practice name and name of physician performing surgery? [facility] 5. What is your office phone [phone]   7.   What is your office fax [phone]  8.   Anesthesia type (None, local, MAC, general) ? GENERAL   Lucienne Minks 12/21/2017, 10:34 AM  _________________________________________________________________   (provider comments below)

## 2017-12-23 ENCOUNTER — Other Ambulatory Visit: Payer: Self-pay

## 2017-12-23 ENCOUNTER — Ambulatory Visit (INDEPENDENT_AMBULATORY_CARE_PROVIDER_SITE_OTHER): Payer: PPO

## 2017-12-23 DIAGNOSIS — I493 Ventricular premature depolarization: Secondary | ICD-10-CM

## 2017-12-23 DIAGNOSIS — I471 Supraventricular tachycardia: Secondary | ICD-10-CM | POA: Diagnosis not present

## 2017-12-23 NOTE — Telephone Encounter (Signed)
Done

## 2017-12-23 NOTE — Telephone Encounter (Signed)
-----   Message from Emily Filbert, RN sent at 12/20/2017 11:31 AM EDT ----- Order placed for an echo per Dr. Fletcher Anon.  She is aware she will be called to schedule- she needs follow up with Dr. Fletcher Anon after to discuss echo results, per MA.  I have forwarded a message to Lattie Haw too as she may need to be worked in w/ [place]. If you don't see anything with him for after her echo, touch base with Lattie Haw.  Thanks!

## 2017-12-23 NOTE — Telephone Encounter (Signed)
lmov to schedule arida 10/29 at 340 holding slot .

## 2017-12-28 ENCOUNTER — Encounter: Payer: Self-pay | Admitting: Cardiovascular Disease

## 2017-12-28 ENCOUNTER — Ambulatory Visit: Payer: PPO | Admitting: Cardiovascular Disease

## 2017-12-28 VITALS — BP 126/70 | HR 69 | Ht 65.0 in | Wt 149.0 lb

## 2017-12-28 DIAGNOSIS — I493 Ventricular premature depolarization: Secondary | ICD-10-CM | POA: Diagnosis not present

## 2017-12-28 DIAGNOSIS — R002 Palpitations: Secondary | ICD-10-CM

## 2017-12-28 DIAGNOSIS — I10 Essential (primary) hypertension: Secondary | ICD-10-CM | POA: Diagnosis not present

## 2017-12-28 DIAGNOSIS — Z01810 Encounter for preprocedural cardiovascular examination: Secondary | ICD-10-CM

## 2017-12-28 NOTE — Telephone Encounter (Signed)
She was seen today in the office.  She is at low risk from a cardiac standpoint.
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
## 2017-12-28 NOTE — Patient Instructions (Signed)

## 2017-12-31 ENCOUNTER — Encounter: Payer: Self-pay | Admitting: *Deleted

## 2017-12-31 NOTE — Telephone Encounter (Signed)
Clearance faxed to [facility] to [phone]

## 2018-01-02 DIAGNOSIS — Z7189 Other specified counseling: Secondary | ICD-10-CM

## 2018-01-02 DIAGNOSIS — C50912 Malignant neoplasm of unspecified site of left female breast: Secondary | ICD-10-CM | POA: Insufficient documentation

## 2018-01-02 DIAGNOSIS — M85832 Other specified disorders of bone density and structure, left forearm: Secondary | ICD-10-CM | POA: Insufficient documentation
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
## 2018-01-03 ENCOUNTER — Inpatient Hospital Stay: Payer: PPO | Attending: Hematology and Oncology | Admitting: Hematology and Oncology

## 2018-01-03 ENCOUNTER — Encounter: Payer: Self-pay | Admitting: *Deleted

## 2018-01-03 ENCOUNTER — Encounter: Payer: Self-pay | Admitting: Hematology and Oncology

## 2018-01-03 VITALS — BP 170/75 | HR 69 | Temp 97.4°F | Resp 18 | Ht 65.0 in | Wt 151.9 lb

## 2018-01-03 DIAGNOSIS — C50212 Malignant neoplasm of upper-inner quadrant of left female breast: Secondary | ICD-10-CM | POA: Diagnosis not present

## 2018-01-03 DIAGNOSIS — C50412 Malignant neoplasm of upper-outer quadrant of left female breast: Secondary | ICD-10-CM | POA: Insufficient documentation

## 2018-01-03 DIAGNOSIS — N189 Chronic kidney disease, unspecified: Secondary | ICD-10-CM | POA: Diagnosis not present

## 2018-01-03 DIAGNOSIS — I129 Hypertensive chronic kidney disease with stage 1 through stage 4 chronic kidney disease, or unspecified chronic kidney disease: Secondary | ICD-10-CM

## 2018-01-03 DIAGNOSIS — Z7189 Other specified counseling: Secondary | ICD-10-CM

## 2018-01-03 DIAGNOSIS — E1122 Type 2 diabetes mellitus with diabetic chronic kidney disease: Secondary | ICD-10-CM | POA: Insufficient documentation

## 2018-01-03 DIAGNOSIS — M8580 Other specified disorders of bone density and structure, unspecified site: Secondary | ICD-10-CM | POA: Diagnosis not present

## 2018-01-03 DIAGNOSIS — M85832 Other specified disorders of bone density and structure, left forearm: Secondary | ICD-10-CM

## 2018-01-03 DIAGNOSIS — Z170 Estrogen receptor positive status [ER+]: Secondary | ICD-10-CM

## 2018-01-05 ENCOUNTER — Encounter: Payer: Self-pay | Admitting: *Deleted

## 2018-01-05 NOTE — Progress Notes (Signed)
  Oncology Nurse Navigator Documentation  Navigator Location: [facility] (01/03/18 1500) Referral date to RadOnc/MedOnc: 01/03/18 (01/03/18 1500) )Navigator Encounter Type: Clinic/MDC (01/03/18 1500)                     Patient Visit Type: MedOnc (01/03/18 1500) Treatment Phase: Pre-Tx/Tx Discussion (01/03/18 1500)                            Time Spent with Patient: 60 (01/03/18 1500)  Met with patient and her husband during her initial medical oncology consult with Dr. Mike Gip.  This is the patient's second breast cancer in her left breast.  She is planning mastectomy.  She did not tolerate antihormonal therapy before, but is in agreement to try a different one this time.  She did not want her educational material.  She is to call if she has any questions or needs.

## 2018-01-05 NOTE — Progress Notes (Signed)
  Oncology Nurse Navigator Documentation  Navigator Location: [facility] (01/05/18 1600) Referral date to RadOnc/MedOnc: 01/05/18 (01/05/18 1600) )Navigator Encounter Type: Telephone (01/05/18 1600) Telephone: Outgoing Call (01/05/18 1600)     Surgery Date: 01/17/18 (01/05/18 1600)                 Barriers/Navigation Needs: Coordination of Care (01/05/18 1600)   Interventions: Coordination of Care (01/05/18 1600)   Coordination of Care: Appts (01/05/18 1600)                  Time Spent with Patient: 15 (01/05/18 1600)   Called and informed patient of her post surgery follow up appointment with dr. Mike Gip on 01/31/18

## 2018-01-10 ENCOUNTER — Inpatient Hospital Stay: Admission: RE | Admit: 2018-01-10 | Payer: PPO | Source: Ambulatory Visit

## 2018-01-11 DIAGNOSIS — C50212 Malignant neoplasm of upper-inner quadrant of left female breast: Secondary | ICD-10-CM | POA: Diagnosis not present

## 2018-01-12 ENCOUNTER — Other Ambulatory Visit: Payer: Self-pay

## 2018-01-12 ENCOUNTER — Encounter
Admission: RE | Admit: 2018-01-12 | Discharge: 2018-01-12 | Disposition: A | Discharge status: Home / Self Care | Payer: PPO | Source: Ambulatory Visit | Attending: General Surgery | Admitting: General Surgery

## 2018-01-12 DIAGNOSIS — Z01818 Encounter for other preprocedural examination: Secondary | ICD-10-CM | POA: Insufficient documentation

## 2018-01-12 NOTE — Patient Instructions (Signed)
Your procedure is scheduled on: Monday 01/17/18 Report to [facility]. To find out your arrival time please call [phone] between 1PM - 3PM on Friday 01/14/18.  Remember: Instructions that are not followed completely may result in serious medical risk, up to and including death, or upon the discretion of your surgeon and anesthesiologist your surgery may need to be rescheduled.     _X__ 1. Do not eat food after midnight the night before your procedure.                 No gum chewing or hard candies. You may drink clear liquids up to 2 hours                 before you are scheduled to arrive for your surgery- DO not drink clear                 liquids within 2 hours of the start of your surgery.                 Clear Liquids include:  water, apple juice without pulp, clear carbohydrate                 drink such as Clearfast or Gatorade, Black Coffee or Tea (Do not add                 anything to coffee or tea).  __X__2.  On the morning of surgery brush your teeth with toothpaste and water, you                 may rinse your mouth with mouthwash if you wish.  Do not swallow any              toothpaste of mouthwash.     _X__ 3.  No Alcohol for 24 hours before or after surgery.   _X__ 4.  Do Not Smoke or use e-cigarettes For 24 Hours Prior to Your Surgery.                 Do not use any chewable tobacco products for at least 6 hours prior to                 surgery.  ____  5.  Bring all medications with you on the day of surgery if instructed.   __X__  6.  Notify your doctor if there is any change in your medical condition      (cold, fever, infections).     Do not wear jewelry, make-up, hairpins, clips or nail polish. Do not wear lotions, powders, or perfumes.  Do not shave 48 hours prior to surgery. Men may shave face and neck. Do not bring valuables to the hospital.    [facility] is not responsible for any belongings or  valuables.  Contacts, dentures/partials or body piercings may not be worn into surgery. Bring a case for your contacts, glasses or hearing aids, a denture cup will be supplied. Leave your suitcase in the car. After surgery it may be brought to your room. For patients admitted to the hospital, discharge time is determined by your treatment team.   Patients discharged the day of surgery will not be allowed to drive home.   Please read over the following fact sheets that you were given:   MRSA Information  __X__ Take these medicines the morning of surgery with A SIP OF WATER:  1. none  2.   3.   4.  5.  6.  ____ Fleet Enema (as directed)   __X__ Use CHG Soap/SAGE wipes as directed  ____ Use inhalers on the day of surgery  ____ Stop metformin/Janumet/Farxiga 2 days prior to surgery    ____ Take 1/2 of usual insulin dose the night before surgery. No insulin the morning          of surgery.   ____ Stop Blood Thinners Coumadin/Plavix/Xarelto/Pleta/Pradaxa/Eliquis/Effient/Aspirin  on   Or contact your Surgeon, Cardiologist or Medical Doctor regarding  ability to stop your blood thinners  __X__ Stop Anti-inflammatories 7 days before surgery such as Advil, Ibuprofen, Motrin,  BC or Goodies Powder, Naprosyn, Naproxen, Aleve, Aspirin    __X__ Stopall herbal supplements, fish oil or vitamin E until after surgery.    ____ Bring C-Pap to the hospital.

## 2018-01-16 MED ORDER — CEFAZOLIN SODIUM-DEXTROSE 2-4 GM/100ML-% IV SOLN
2.0000 g | INTRAVENOUS | Status: AC
  Administered 2018-01-17: 2 g via INTRAVENOUS

## 2018-01-17 ENCOUNTER — Other Ambulatory Visit: Payer: Self-pay

## 2018-01-17 ENCOUNTER — Ambulatory Visit: Payer: PPO | Admitting: Anesthesiology

## 2018-01-17 ENCOUNTER — Ambulatory Visit
Admission: RE | Admit: 2018-01-17 | Discharge: 2018-01-17 | Disposition: A | Discharge status: Home / Self Care | Payer: PPO | Source: Ambulatory Visit | Attending: General Surgery | Admitting: General Surgery

## 2018-01-17 ENCOUNTER — Encounter
Admission: RE | Disposition: A | Discharge status: Home / Self Care | Payer: Self-pay | Source: Ambulatory Visit | Attending: General Surgery

## 2018-01-17 ENCOUNTER — Encounter: Payer: Self-pay | Admitting: *Deleted

## 2018-01-17 DIAGNOSIS — N189 Chronic kidney disease, unspecified: Secondary | ICD-10-CM | POA: Diagnosis not present

## 2018-01-17 DIAGNOSIS — Z791 Long term (current) use of non-steroidal anti-inflammatories (NSAID): Secondary | ICD-10-CM | POA: Diagnosis not present

## 2018-01-17 DIAGNOSIS — N183 Chronic kidney disease, stage 3 (moderate): Secondary | ICD-10-CM | POA: Diagnosis not present

## 2018-01-17 DIAGNOSIS — Z85828 Personal history of other malignant neoplasm of skin: Secondary | ICD-10-CM | POA: Insufficient documentation

## 2018-01-17 DIAGNOSIS — Z923 Personal history of irradiation: Secondary | ICD-10-CM | POA: Diagnosis not present

## 2018-01-17 DIAGNOSIS — C50912 Malignant neoplasm of unspecified site of left female breast: Secondary | ICD-10-CM | POA: Diagnosis not present

## 2018-01-17 DIAGNOSIS — C50212 Malignant neoplasm of upper-inner quadrant of left female breast: Secondary | ICD-10-CM | POA: Insufficient documentation

## 2018-01-17 DIAGNOSIS — E7800 Pure hypercholesterolemia, unspecified: Secondary | ICD-10-CM | POA: Diagnosis not present

## 2018-01-17 DIAGNOSIS — I129 Hypertensive chronic kidney disease with stage 1 through stage 4 chronic kidney disease, or unspecified chronic kidney disease: Secondary | ICD-10-CM | POA: Diagnosis not present

## 2018-01-17 DIAGNOSIS — E1122 Type 2 diabetes mellitus with diabetic chronic kidney disease: Secondary | ICD-10-CM | POA: Insufficient documentation

## 2018-01-17 DIAGNOSIS — Z170 Estrogen receptor positive status [ER+]: Secondary | ICD-10-CM | POA: Diagnosis not present

## 2018-01-17 DIAGNOSIS — K219 Gastro-esophageal reflux disease without esophagitis: Secondary | ICD-10-CM | POA: Diagnosis not present

## 2018-01-17 DIAGNOSIS — E785 Hyperlipidemia, unspecified: Secondary | ICD-10-CM | POA: Diagnosis not present

## 2018-01-17 DIAGNOSIS — Z79899 Other long term (current) drug therapy: Secondary | ICD-10-CM | POA: Diagnosis not present

## 2018-01-17 HISTORY — PX: MASTECTOMY W/ SENTINEL NODE BIOPSY: SHX2001

## 2018-01-17 LAB — GLUCOSE, CAPILLARY
GLUCOSE-CAPILLARY: 118 mg/dL — AB (ref 70–99)
GLUCOSE-CAPILLARY: 150 mg/dL — AB (ref 70–99)

## 2018-01-17 SURGERY — MASTECTOMY WITH SENTINEL LYMPH NODE BIOPSY
Anesthesia: General | Site: Breast | Laterality: Left

## 2018-01-17 MED ORDER — HYDROCODONE-ACETAMINOPHEN 5-325 MG PO TABS
1.0000 | ORAL_TABLET | ORAL | Status: DC | PRN
  Administered 2018-01-17: 1 via ORAL
  Filled 2018-01-17: qty 1

## 2018-01-17 MED ORDER — FENTANYL CITRATE (PF) 100 MCG/2ML IJ SOLN
25.0000 ug | INTRAMUSCULAR | Status: DC | PRN
  Administered 2018-01-17 (×4): 50 ug via INTRAVENOUS

## 2018-01-17 MED ORDER — GLYCOPYRROLATE 0.2 MG/ML IJ SOLN
INTRAMUSCULAR | Status: DC | PRN
  Administered 2018-01-17: 0.2 mg via INTRAVENOUS

## 2018-01-17 MED ORDER — CELECOXIB 200 MG PO CAPS
ORAL_CAPSULE | ORAL | Status: AC
  Administered 2018-01-17: 200 mg via ORAL
  Filled 2018-01-17: qty 1

## 2018-01-17 MED ORDER — ONDANSETRON HCL 4 MG/2ML IJ SOLN: 4.0000 mg | Freq: Four times a day (QID) | INTRAMUSCULAR | Status: DC | PRN

## 2018-01-17 MED ORDER — DEXAMETHASONE SODIUM PHOSPHATE 10 MG/ML IJ SOLN
INTRAMUSCULAR | Status: DC | PRN
  Administered 2018-01-17: 5 mg via INTRAVENOUS

## 2018-01-17 MED ORDER — PRAVASTATIN SODIUM 20 MG PO TABS
10.0000 mg | ORAL_TABLET | Freq: Every day | ORAL | Status: DC
  Filled 2018-01-17: qty 1

## 2018-01-17 MED ORDER — FENTANYL CITRATE (PF) 100 MCG/2ML IJ SOLN: 25.0000 ug | INTRAMUSCULAR | Status: DC | PRN

## 2018-01-17 MED ORDER — FENTANYL CITRATE (PF) 100 MCG/2ML IJ SOLN
INTRAMUSCULAR | Status: AC
  Administered 2018-01-17: 50 ug via INTRAVENOUS
  Filled 2018-01-17: qty 2

## 2018-01-17 MED ORDER — ACETAMINOPHEN 500 MG PO TABS
ORAL_TABLET | ORAL | Status: AC
  Administered 2018-01-17: 1000 mg via ORAL
  Filled 2018-01-17: qty 2

## 2018-01-17 MED ORDER — FENTANYL CITRATE (PF) 100 MCG/2ML IJ SOLN
INTRAMUSCULAR | Status: AC
  Filled 2018-01-17: qty 2

## 2018-01-17 MED ORDER — PROPOFOL 10 MG/ML IV BOLUS
INTRAVENOUS | Status: DC | PRN
  Administered 2018-01-17: 20 mg via INTRAVENOUS
  Administered 2018-01-17: 180 mg via INTRAVENOUS

## 2018-01-17 MED ORDER — GABAPENTIN 300 MG PO CAPS
ORAL_CAPSULE | ORAL | Status: AC
  Administered 2018-01-17: 300 mg via ORAL
  Filled 2018-01-17: qty 1

## 2018-01-17 MED ORDER — FAMOTIDINE 20 MG PO TABS
ORAL_TABLET | ORAL | Status: AC
  Administered 2018-01-17: 20 mg via ORAL
  Filled 2018-01-17: qty 1

## 2018-01-17 MED ORDER — METHOCARBAMOL 500 MG PO TABS: 500.0000 mg | ORAL_TABLET | Freq: Four times a day (QID) | ORAL | 1 refills | Status: DC | PRN

## 2018-01-17 MED ORDER — FENTANYL CITRATE (PF) 250 MCG/5ML IJ SOLN
INTRAMUSCULAR | Status: AC
  Filled 2018-01-17: qty 5

## 2018-01-17 MED ORDER — DEXAMETHASONE SODIUM PHOSPHATE 10 MG/ML IJ SOLN
INTRAMUSCULAR | Status: AC
  Filled 2018-01-17: qty 1

## 2018-01-17 MED ORDER — SODIUM CHLORIDE 0.9 % IV SOLN
INTRAVENOUS | Status: DC
  Administered 2018-01-17: 09:00:00 via INTRAVENOUS
  Administered 2018-01-17: 1000 mL via INTRAVENOUS

## 2018-01-17 MED ORDER — BUPIVACAINE-EPINEPHRINE (PF) 0.5% -1:200000 IJ SOLN
INTRAMUSCULAR | Status: AC
  Filled 2018-01-17: qty 30

## 2018-01-17 MED ORDER — FAMOTIDINE 20 MG PO TABS
20.0000 mg | ORAL_TABLET | Freq: Once | ORAL | Status: AC
  Administered 2018-01-17: 20 mg via ORAL

## 2018-01-17 MED ORDER — PROPOFOL 10 MG/ML IV BOLUS
INTRAVENOUS | Status: AC
  Filled 2018-01-17: qty 20

## 2018-01-17 MED ORDER — HYDROCODONE-ACETAMINOPHEN 5-325 MG PO TABS: 1.0000 | ORAL_TABLET | Freq: Four times a day (QID) | ORAL | 0 refills | Status: DC | PRN

## 2018-01-17 MED ORDER — ONDANSETRON HCL 4 MG/2ML IJ SOLN
INTRAMUSCULAR | Status: AC
  Filled 2018-01-17: qty 2

## 2018-01-17 MED ORDER — EPHEDRINE SULFATE 50 MG/ML IJ SOLN
INTRAMUSCULAR | Status: DC | PRN
  Administered 2018-01-17: 10 mg via INTRAVENOUS

## 2018-01-17 MED ORDER — FENTANYL CITRATE (PF) 100 MCG/2ML IJ SOLN
INTRAMUSCULAR | Status: DC | PRN
  Administered 2018-01-17: 25 ug via INTRAVENOUS
  Administered 2018-01-17: 50 ug via INTRAVENOUS
  Administered 2018-01-17: 75 ug via INTRAVENOUS
  Administered 2018-01-17: 25 ug via INTRAVENOUS
  Administered 2018-01-17: 75 ug via INTRAVENOUS

## 2018-01-17 MED ORDER — HYDROMORPHONE HCL 1 MG/ML IJ SOLN: 0.5000 mg | INTRAMUSCULAR | Status: DC | PRN

## 2018-01-17 MED ORDER — PANTOPRAZOLE SODIUM 40 MG IV SOLR: 40.0000 mg | Freq: Every day | INTRAVENOUS | Status: DC

## 2018-01-17 MED ORDER — EPHEDRINE SULFATE 50 MG/ML IJ SOLN
INTRAMUSCULAR | Status: AC
  Filled 2018-01-17: qty 1

## 2018-01-17 MED ORDER — ONDANSETRON HCL 4 MG/2ML IJ SOLN
INTRAMUSCULAR | Status: DC | PRN
  Administered 2018-01-17: 4 mg via INTRAVENOUS

## 2018-01-17 MED ORDER — HEPARIN SODIUM (PORCINE) 5000 UNIT/ML IJ SOLN: 5000.0000 [IU] | Freq: Three times a day (TID) | INTRAMUSCULAR | Status: DC

## 2018-01-17 MED ORDER — CHLORHEXIDINE GLUCONATE CLOTH 2 % EX PADS: 6.0000 | MEDICATED_PAD | Freq: Once | CUTANEOUS | Status: DC

## 2018-01-17 MED ORDER — ONDANSETRON 4 MG PO TBDP: 4.0000 mg | ORAL_TABLET | Freq: Four times a day (QID) | ORAL | Status: DC | PRN

## 2018-01-17 MED ORDER — LOSARTAN POTASSIUM 50 MG PO TABS
100.0000 mg | ORAL_TABLET | Freq: Every day | ORAL | Status: DC
  Administered 2018-01-17: 100 mg via ORAL
  Filled 2018-01-17: qty 2

## 2018-01-17 MED ORDER — KCL IN DEXTROSE-NACL 20-5-0.9 MEQ/L-%-% IV SOLN
INTRAVENOUS | Status: DC
  Filled 2018-01-17: qty 1000

## 2018-01-17 MED ORDER — GABAPENTIN 300 MG PO CAPS
300.0000 mg | ORAL_CAPSULE | ORAL | Status: AC
  Administered 2018-01-17: 300 mg via ORAL

## 2018-01-17 MED ORDER — DOCUSATE SODIUM 100 MG PO CAPS: 100.0000 mg | ORAL_CAPSULE | Freq: Every day | ORAL | Status: DC

## 2018-01-17 MED ORDER — TECHNETIUM TC 99M SULFUR COLLOID FILTERED
1.0000 | Freq: Once | INTRAVENOUS | Status: AC | PRN
  Administered 2018-01-17: 0.679 via INTRADERMAL

## 2018-01-17 MED ORDER — ACETAMINOPHEN 500 MG PO TABS
1000.0000 mg | ORAL_TABLET | ORAL | Status: AC
  Administered 2018-01-17: 1000 mg via ORAL

## 2018-01-17 MED ORDER — CEFAZOLIN SODIUM-DEXTROSE 2-4 GM/100ML-% IV SOLN
INTRAVENOUS | Status: AC
  Filled 2018-01-17: qty 100

## 2018-01-17 MED ORDER — METHOCARBAMOL 500 MG PO TABS
500.0000 mg | ORAL_TABLET | Freq: Four times a day (QID) | ORAL | Status: DC | PRN
  Filled 2018-01-17: qty 1

## 2018-01-17 MED ORDER — CELECOXIB 200 MG PO CAPS
200.0000 mg | ORAL_CAPSULE | ORAL | Status: AC
  Administered 2018-01-17: 200 mg via ORAL

## 2018-01-17 SURGICAL SUPPLY — 60 items
APPLIER CLIP 11 MED OPEN (CLIP)
APPLIER CLIP 9.375 SM OPEN (CLIP) ×3
BANDAGE ELASTIC 6 LF NS (GAUZE/BANDAGES/DRESSINGS) IMPLANT
BINDER BREAST LRG (GAUZE/BANDAGES/DRESSINGS) ×3 IMPLANT
BINDER BREAST MEDIUM (GAUZE/BANDAGES/DRESSINGS) IMPLANT
BINDER BREAST XLRG (GAUZE/BANDAGES/DRESSINGS) IMPLANT
BINDER BREAST XXLRG (GAUZE/BANDAGES/DRESSINGS) IMPLANT
BIOPATCH WHT 1IN DISK W/4.0 H (GAUZE/BANDAGES/DRESSINGS) ×3 IMPLANT
BLADE PHOTON ILLUMINATED (MISCELLANEOUS) ×3 IMPLANT
BLADE SURG 15 STRL SS SAFETY (BLADE) ×3 IMPLANT
BULB RESERV EVAC DRAIN JP 100C (MISCELLANEOUS) ×3 IMPLANT
CANISTER SUCT 1200ML W/VALVE (MISCELLANEOUS) ×3 IMPLANT
CHLORAPREP W/TINT 26ML (MISCELLANEOUS) ×3 IMPLANT
CLIP APPLIE 11 MED OPEN (CLIP) IMPLANT
CLIP APPLIE 9.375 SM OPEN (CLIP) ×1 IMPLANT
CNTNR SPEC 2.5X3XGRAD LEK (MISCELLANEOUS) ×1
CONT SPEC 4OZ STER OR WHT (MISCELLANEOUS) ×2
CONTAINER SPEC 2.5X3XGRAD LEK (MISCELLANEOUS) ×1 IMPLANT
COVER WAND RF STERILE (DRAPES) IMPLANT
DERMABOND ADVANCED (GAUZE/BANDAGES/DRESSINGS) ×2
DERMABOND ADVANCED .7 DNX12 (GAUZE/BANDAGES/DRESSINGS) ×1 IMPLANT
DEVICE DUBIN SPECIMEN MAMMOGRA (MISCELLANEOUS) IMPLANT
DRAIN CHANNEL 19F RND (DRAIN) ×3 IMPLANT
DRAPE CHEST BREAST 77X106 FENE (MISCELLANEOUS) ×3 IMPLANT
DRAPE SURG 17X11 SM STRL (DRAPES) IMPLANT
DRAPE UTILITY 15X26 TOWEL STRL (DRAPES) ×6 IMPLANT
DRSG TELFA 3X8 NADH (GAUZE/BANDAGES/DRESSINGS) ×3 IMPLANT
ELECT CAUTERY BLADE TIP 2.5 (TIP) ×3
ELECT REM PT RETURN 9FT ADLT (ELECTROSURGICAL)
ELECTRODE CAUTERY BLDE TIP 2.5 (TIP) ×1 IMPLANT
ELECTRODE REM PT RTRN 9FT ADLT (ELECTROSURGICAL) IMPLANT
GLOVE BIO SURGEON STRL SZ7.5 (GLOVE) ×6 IMPLANT
GOWN STRL REUS W/ TWL LRG LVL3 (GOWN DISPOSABLE) ×2 IMPLANT
GOWN STRL REUS W/TWL LRG LVL3 (GOWN DISPOSABLE) ×4
LABEL OR SOLS (LABEL) ×3 IMPLANT
NEEDLE HYPO 22GX1.5 SAFETY (NEEDLE) ×3 IMPLANT
PACK BASIN MINOR ARMC (MISCELLANEOUS) ×3 IMPLANT
PAD ABD DERMACEA PRESS 5X9 (GAUZE/BANDAGES/DRESSINGS) IMPLANT
PIN SAFETY STRL (MISCELLANEOUS) IMPLANT
SLEVE PROBE SENORX GAMMA FIND (MISCELLANEOUS) ×6 IMPLANT
SPONGE LAP 18X18 RF (DISPOSABLE) ×3 IMPLANT
SUT ETHILON 3 0 PS 1 (SUTURE) IMPLANT
SUT ETHILON 3-0 FS-10 30 BLK (SUTURE) ×3
SUT MNCRL AB 4-0 PS2 18 (SUTURE) ×3 IMPLANT
SUT SILK 0 (SUTURE)
SUT SILK 0 30XBRD TIE 6 (SUTURE) IMPLANT
SUT SILK 3 0 (SUTURE) ×2
SUT SILK 3-0 18XBRD TIE 12 (SUTURE) ×1 IMPLANT
SUT VIC AB 0 CT1 36 (SUTURE) ×3 IMPLANT
SUT VIC AB 2-0 CT1 27 (SUTURE) ×2
SUT VIC AB 2-0 CT1 TAPERPNT 27 (SUTURE) ×1 IMPLANT
SUT VIC AB 3-0 SH 27 (SUTURE) ×2
SUT VIC AB 3-0 SH 27X BRD (SUTURE) ×1 IMPLANT
SUT VICRYL 3-0 CR8 SH (SUTURE) ×3 IMPLANT
SUT VICRYL+ 3-0 144IN (SUTURE) ×3 IMPLANT
SUTURE EHLN 3-0 FS-10 30 BLK (SUTURE) ×1 IMPLANT
SYR 10ML LL (SYRINGE) ×3 IMPLANT
SYR BULB IRRIG 60ML STRL (SYRINGE) ×3 IMPLANT
TAPE TRANSPORE STRL 2 31045 (GAUZE/BANDAGES/DRESSINGS) IMPLANT
TRAY FOLEY SLVR 16FR LF STAT (SET/KITS/TRAYS/PACK) IMPLANT

## 2018-01-17 NOTE — Interval H&P Note (Signed)
History and Physical Interval Note:  01/17/2018 9:40 AM  Paige Medina  has presented today for surgery, with the diagnosis of LEFT BREAST CANCER  The various methods of treatment have been discussed with the patient and family. After consideration of risks, benefits and other options for treatment, the patient has consented to  Procedure(s): LEFT MASTECTOMY WITH LEFT  SENTINEL LYMPH NODE BIOPSY (Left) as a surgical intervention .  The patient's history has been reviewed, patient examined, no change in status, stable for surgery.  I have reviewed the patient's chart and labs.  Questions were answered to the patient's satisfaction.     [name] III

## 2018-01-17 NOTE — Op Note (Signed)
01/17/2018  12:44 PM  PATIENT:  Paige Medina  84 y.o. female  PRE-OPERATIVE DIAGNOSIS:  LEFT BREAST CANCER  POST-OPERATIVE DIAGNOSIS:  left breast cancer  PROCEDURE:  Procedure(s): LEFT MASTECTOMY WITH LEFT  DEEP LEFT AXILLARY SENTINEL LYMPH NODE BIOPSY (Left)  SURGEON:  Surgeon(s) and Role:    * Jovita Kussmaul, MD - Primary  PHYSICIAN ASSISTANT:   ASSISTANTS: none   ANESTHESIA:   general  EBL:  minimal   BLOOD ADMINISTERED:none  DRAINS: (1) Jackson-Pratt drain(s) with closed bulb suction in the prepectoral space   LOCAL MEDICATIONS USED:  NONE  SPECIMEN:  Source of Specimen:  left mastectomy and sentinel node  DISPOSITION OF SPECIMEN:  PATHOLOGY  COUNTS:  YES  TOURNIQUET:  * No tourniquets in log *  DICTATION: .Dragon Dictation   After informed consent was obtained the patient was brought to the operating room and placed in the supine position on the operating table.  After adequate induction of general anesthesia the patient's left chest, breast, and axillary area were prepped with ChloraPrep, allowed to dry, and draped in usual sterile manner.  An appropriate timeout was performed.  Earlier in the day the patient underwent injection of 1 mCi of technetium sulfur colloid in the subareolar position on the left.  Next an elliptical incision was made around the nipple and areole a complex in order to minimize the excess skin.  The incision was carried through the skin and subcutaneous tissue sharply with the plasma blade.  Breast hooks were used to elevate the skin flaps anteriorly towards the saline.  Thin skin flaps were then created circumferentially by dissecting sharply with the plasma blade between the breast tissue in the subcutaneous fat.  This dissection was carried all the way to the chest wall.  Next the breast was removed from the pectoralis muscle with the pectoralis fascia.  Couple small perforating vessels were controlled with 3-0 Vicryl figure-of-eight  stitches.  Once the breast was removed it was oriented with a stitch on the lateral skin and sent to pathology for further evaluation.  A single palpable lymph node was identified in the left axilla and was removed sharply with the plasma blade.  There was no nuclear tracer signal in the left axilla.  There were no other palpable lymph nodes in the left axilla identified.  This node was sent as sentinel node #1.  The wound was then irrigated with copious amounts of saline.  The lateral axillary tissue was anchored to the chest wall with a running 0 Vicryl stitch.  A small stab incision was made near the anterior axillary line inferior to the operative bed.  A tonsil clamp was placed through this opening and used to bring a 19 [place] round Blake drain into the operative bed.  The drain was curled along the chest wall and the end of the drain was placed in the axilla.  The drain was anchored to the skin with a 3-0 nylon stitch.  Next the superior and inferior skin flaps were grossly reapproximated with interrupted 3-0 Vicryl stitches.  The skin was then closed with a running 4-0 Monocryl subcuticular stitch.  Dermabond dressings were applied.  The patient tolerated the procedure well.  The drain was placed to bulb suction and there was a good seal.  The area was examined and completely hemostatic before closing.  At the end of the case all needle sponge and instrument counts were correct.  The patient was then awakened and taken to recovery in  stable condition.  PLAN OF CARE: Admit for overnight observation  PATIENT DISPOSITION:  PACU - hemodynamically stable.   Delay start of Pharmacological VTE agent (>24hrs) due to surgical blood loss or risk of bleeding: no

## 2018-01-17 NOTE — Progress Notes (Signed)
Discharge instructions reviewed the patient and her family. Patient sent out via wheelchair to waiting car

## 2018-01-17 NOTE — Anesthesia Post-op Follow-up Note (Signed)
Anesthesia QCDR form completed.        

## 2018-01-17 NOTE — H&P (Signed)
Paige Medina  Location: [place] [facility] Patient #: [id number] DOB: 14-Apr-1933 Married / Language: English / Race: White Female   History of Present Illness  The patient is a 84 year old female who presents with breast cancer. we are asked to see the patient in consultation by Dr. Danise Mina to evaluate her for a new left breast cancer. The patient is a 85 year old white female who has a history of left breast cancer in 2012 that was treated with lumpectomy and what sounds like partial breast radiation with a MammoSite balloon. She could not tolerate anti-estrogens. She now has a 5 mm area of distortion in the upper inner left breast. This was biopsied and came back as an invasive breast cancer that was ER and PR positive and HER-2 negative. She denies any significant breast pain although she does not like the old scar. She denies any discharge from the nipple. She does not smoke. She has been having some PVCs and is seen her cardiologist for this with plans for an echocardiogram   Past Surgical History  Appendectomy  Breast Biopsy  Left. Breast Mass; Local Excision  Left. Cataract Surgery  Left. Colon Polyp Removal - Colonoscopy  Hysterectomy (not due to cancer) - Partial  Knee Surgery  Left. Tonsillectomy   Diagnostic Studies History  Colonoscopy  5-10 years ago Mammogram  within last year Pap Smear  >5 years ago  Allergies Lidocaine *CHEMICALS*  Itching, Swelling. Lipitor *ANTIHYPERLIPIDEMICS*  Ciprofloxacin *CHEMICALS*  Nausea. oxyCODONE HCl *ANALGESICS - OPIOID*  Nausea. Adhesive Tape 1/2"x10yd *MEDICAL DEVICES AND SUPPLIES*  Tegaderm *MEDICAL DEVICES AND SUPPLIES*  Rash. traMADol HCl *CHEMICALS*  Nausea.  Medication History  Clobetasol Propionate (0.05% Cream, External) Active. Clobetasol Propionate (0.05% Solution, External) Active. Calcium (Oral) Specific strength unknown - Active. Vitamin D3 (Oral) Specific strength unknown -  Active. Lovastatin (40MG Tablet, Oral) Active. Losartan Potassium (100MG Tablet, Oral) Active. hydroCHLOROthiazide (12.5MG Capsule, Oral) Active. Tylenol (Oral) Specific strength unknown - Active. Aleve (Oral) Specific strength unknown - Active. Colace (Oral) Specific strength unknown - Active. Co Q 10 (Oral) Specific strength unknown - Active. Probiotic (Oral) Active. Krill Oil (Oral) Specific strength unknown - Active. Rx Triamcinolone (0.1% Cream, External) Active. Medications Reconciled  Social History  Alcohol use  Occasional alcohol use. Caffeine use  Tea. No drug use  Tobacco use  Never smoker.  Family History  Depression  Mother. Diabetes Mellitus  Mother. Heart Disease  Father. Heart disease in female family member before age 39  Hypertension  Father, Mother. Kidney Disease  Father, Mother.  Pregnancy / Birth History  Age at menarche  52 years. Age of menopause  >52 Gravida  2 Maternal age  82-30 Para  2  Other Problems  Arthritis  Back Pain  Breast Cancer  Diabetes Mellitus  Heart murmur  High blood pressure  Hypercholesterolemia     Review of Systems  General Not Present- Appetite Loss, Chills, Fatigue, Fever, Night Sweats, Weight Gain and Weight Loss. Skin Not Present- Change in Wart/Mole, Dryness, Hives, Jaundice, New Lesions, Non-Healing Wounds, Rash and Ulcer. HEENT Present- Ringing in the Ears. Not Present- Earache, Hearing Loss, Hoarseness, Nose Bleed, Oral Ulcers, Seasonal Allergies, Sinus Pain, Sore Throat, Visual Disturbances, Wears glasses/contact lenses and Yellow Eyes. Respiratory Not Present- Bloody sputum, Chronic Cough, Difficulty Breathing, Snoring and Wheezing. Breast Present- Breast Mass. Not Present- Breast Pain, Nipple Discharge and Skin Changes. Cardiovascular Present- Palpitations. Not Present- Chest Pain, Difficulty Breathing Lying Down, Leg Cramps, Rapid Heart Rate, Shortness of Breath and Swelling  of  Extremities. Gastrointestinal Present- Constipation and Hemorrhoids. Not Present- Abdominal Pain, Bloating, Bloody Stool, Change in Bowel Habits, Chronic diarrhea, Difficulty Swallowing, Excessive gas, Gets full quickly at meals, Indigestion, Nausea, Rectal Pain and Vomiting. Female Genitourinary Not Present- Frequency, Nocturia, Painful Urination, Pelvic Pain and Urgency. Musculoskeletal Present- Back Pain and Joint Pain. Not Present- Joint Stiffness, Muscle Pain, Muscle Weakness and Swelling of Extremities. Neurological Not Present- Decreased Memory, Fainting, Headaches, Numbness, Seizures, Tingling, Tremor, Trouble walking and Weakness. Psychiatric Not Present- Anxiety, Bipolar, Change in Sleep Pattern, Depression, Fearful and Frequent crying. Endocrine Not Present- Cold Intolerance, Excessive Hunger, Hair Changes, Heat Intolerance, Hot flashes and New Diabetes. Hematology Not Present- Blood Thinners, Easy Bruising, Excessive bleeding, Gland problems, HIV and Persistent Infections.  Vitals  Weight: 149.25 lb Height: 65.5in Body Surface Area: 1.76 m Body Mass Index: 24.46 kg/m        Physical Exam  General Mental Status-Alert. General Appearance-Consistent with stated age. Hydration-Well hydrated. Voice-Normal.  Head and Neck Head-normocephalic, atraumatic with no lesions or palpable masses. Trachea-midline. Thyroid Gland Characteristics - normal size and consistency.  Eye Eyeball - Bilateral-Extraocular movements intact. Sclera/Conjunctiva - Bilateral-No scleral icterus.  Chest and Lung Exam Chest and lung exam reveals -quiet, even and easy respiratory effort with no use of accessory muscles and on auscultation, normal breath sounds, no adventitious sounds and normal vocal resonance. Inspection Chest Wall - Normal. Back - normal.  Breast Note: there is a firm palpable contracted scar in the outer aspect of the left breast. Other than this there  is no other palpable mass in either breast. There is no palpable axillary, supraclavicular, or cervical lymphadenopathy.   Cardiovascular Cardiovascular examination reveals -normal heart sounds, regular rate and rhythm with no murmurs and normal pedal pulses bilaterally.  Abdomen Inspection Inspection of the abdomen reveals - No Hernias. Skin - Scar - no surgical scars. Palpation/Percussion Palpation and Percussion of the abdomen reveal - Soft, Non Tender, No Rebound tenderness, No Rigidity (guarding) and No hepatosplenomegaly. Auscultation Auscultation of the abdomen reveals - Bowel sounds normal.  Neurologic Neurologic evaluation reveals -alert and oriented x 3 with no impairment of recent or remote memory. Mental Status-Normal.  Musculoskeletal Normal Exam - Left-Upper Extremity Strength Normal and Lower Extremity Strength Normal. Normal Exam - Right-Upper Extremity Strength Normal and Lower Extremity Strength Normal.  Lymphatic Head & Neck  General Head & Neck Lymphatics: Bilateral - Description - Normal. Axillary  General Axillary Region: Bilateral - Description - Normal. Tenderness - Non Tender. Femoral & Inguinal  Generalized Femoral & Inguinal Lymphatics: Bilateral - Description - Normal. Tenderness - Non Tender.    Assessment & Plan  MALIGNANT NEOPLASM OF UPPER-INNER QUADRANT OF LEFT BREAST IN FEMALE, ESTROGEN RECEPTOR POSITIVE (C50.212) Impression: the patient appears to have a small stage I cancer in the upper inner left breast. Unfortunately she has a history of left breast cancer in 2012 that was also treated with lumpectomy and radiation. Because of this history of radiation I would recommend mastectomy. She is in agreement with this plan. I have discussed with her in detail the risks and benefits of the operation as well as some of the technical aspects and she understaod candiwite for repeated. She will also be a good candidate for repeat sentinel node  mapping. I will go ahead and refer her to medical oncology. She will also need cardiac clearance prior to surgery and we will begin working on that. Current Plans Referred to Oncology, for evaluation and follow up (Oncology). Routine. Pt  Education - Breast Cancer: discussed with patient and provided information.

## 2018-01-17 NOTE — Transfer of Care (Signed)
Immediate Anesthesia Transfer of Care Note  Patient: Paige Medina  Procedure(s) Performed: LEFT MASTECTOMY WITH LEFT  SENTINEL LYMPH NODE BIOPSY (Left Breast)  Patient Location: PACU  Anesthesia Type:General  Level of Consciousness: awake, alert , oriented and patient cooperative  Airway & Oxygen Therapy: Patient Spontanous Breathing and Patient connected to face mask oxygen  Post-op Assessment: Report given to RN and Post -op Vital signs reviewed and stable  Post vital signs: Reviewed and stable  Last Vitals:  Vitals Value Taken Time  BP 139/62 01/17/2018  1:02 PM  Temp    Pulse 74 01/17/2018  1:06 PM  Resp 9 01/17/2018  1:06 PM  SpO2 100 % 01/17/2018  1:06 PM  Vitals shown include unvalidated device data.  Last Pain:  Vitals:   01/17/18 0836  TempSrc: Temporal  PainSc: 0-No pain         Complications: No apparent anesthesia complications

## 2018-01-17 NOTE — Anesthesia Postprocedure Evaluation (Signed)
Anesthesia Post Note  Patient: Paige Medina  Procedure(s) Performed: LEFT MASTECTOMY WITH LEFT  SENTINEL LYMPH NODE BIOPSY (Left Breast)  Patient location during evaluation: PACU Anesthesia Type: General Level of consciousness: awake and alert Pain management: pain level controlled Vital Signs Assessment: post-procedure vital signs reviewed and stable Respiratory status: spontaneous breathing, nonlabored ventilation, respiratory function stable and patient connected to nasal cannula oxygen Cardiovascular status: blood pressure returned to baseline and stable Postop Assessment: no apparent nausea or vomiting Anesthetic complications: no     Last Vitals:  Vitals:   01/17/18 1332 01/17/18 1347  BP: 139/70 136/62  Pulse: 73 72  Resp: 18 14  Temp:    SpO2: 93% 96%    Last Pain:  Vitals:   01/17/18 1347  TempSrc:   PainSc: 4                  [name]
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
## 2018-01-17 NOTE — Anesthesia Procedure Notes (Signed)
Procedure Name: LMA Insertion Date/Time: 01/17/2018 11:30 AM Performed by: Eben Burow, CRNA Pre-anesthesia Checklist: Patient identified, Emergency Drugs available, Suction available, Patient being monitored and Timeout performed Patient Re-evaluated:Patient Re-evaluated prior to induction Oxygen Delivery Method: Circle system utilized Preoxygenation: Pre-oxygenation with 100% oxygen Induction Type: IV induction Ventilation: Mask ventilation without difficulty LMA: LMA inserted LMA Size: 4.0 Number of attempts: 1 Placement Confirmation: positive ETCO2 and breath sounds checked- equal and bilateral Tube secured with: Tape Dental Injury: Teeth and Oropharynx as per pre-operative assessment

## 2018-01-18 ENCOUNTER — Encounter: Payer: Self-pay | Admitting: General Surgery

## 2018-01-18 LAB — SURGICAL PATHOLOGY

## 2018-01-22 ENCOUNTER — Encounter: Payer: Self-pay | Admitting: Family Medicine

## 2018-01-31 ENCOUNTER — Inpatient Hospital Stay: Payer: PPO | Admitting: Hematology and Oncology

## 2018-02-10 ENCOUNTER — Ambulatory Visit
Admission: RE | Admit: 2018-02-10 | Discharge: 2018-02-10 | Disposition: A | Discharge status: Home / Self Care | Payer: PPO | Source: Ambulatory Visit | Attending: Hematology and Oncology | Admitting: Hematology and Oncology

## 2018-02-10 DIAGNOSIS — M8589 Other specified disorders of bone density and structure, multiple sites: Secondary | ICD-10-CM | POA: Diagnosis not present

## 2018-02-10 DIAGNOSIS — M85832 Other specified disorders of bone density and structure, left forearm: Secondary | ICD-10-CM

## 2018-02-10 DIAGNOSIS — Z780 Asymptomatic menopausal state: Secondary | ICD-10-CM | POA: Diagnosis not present

## 2018-02-12 ENCOUNTER — Other Ambulatory Visit: Payer: Self-pay | Admitting: Family Medicine

## 2018-03-07 DIAGNOSIS — L905 Scar conditions and fibrosis of skin: Secondary | ICD-10-CM | POA: Diagnosis not present

## 2018-03-07 DIAGNOSIS — Z1283 Encounter for screening for malignant neoplasm of skin: Secondary | ICD-10-CM | POA: Diagnosis not present

## 2018-03-07 DIAGNOSIS — L720 Epidermal cyst: Secondary | ICD-10-CM | POA: Diagnosis not present

## 2018-03-07 DIAGNOSIS — Z85828 Personal history of other malignant neoplasm of skin: Secondary | ICD-10-CM | POA: Diagnosis not present

## 2018-03-07 DIAGNOSIS — L578 Other skin changes due to chronic exposure to nonionizing radiation: Secondary | ICD-10-CM | POA: Diagnosis not present

## 2018-03-07 DIAGNOSIS — L821 Other seborrheic keratosis: Secondary | ICD-10-CM | POA: Diagnosis not present

## 2018-03-07 DIAGNOSIS — L570 Actinic keratosis: Secondary | ICD-10-CM | POA: Diagnosis not present

## 2018-03-07 DIAGNOSIS — D1800 Hemangioma unspecified site: Secondary | ICD-10-CM | POA: Diagnosis not present

## 2018-03-07 DIAGNOSIS — L719 Rosacea, unspecified: Secondary | ICD-10-CM | POA: Diagnosis not present

## 2018-03-07 DIAGNOSIS — D229 Melanocytic nevi, unspecified: Secondary | ICD-10-CM | POA: Diagnosis not present

## 2018-03-07 DIAGNOSIS — D692 Other nonthrombocytopenic purpura: Secondary | ICD-10-CM | POA: Diagnosis not present

## 2018-03-08 DIAGNOSIS — L723 Sebaceous cyst: Secondary | ICD-10-CM | POA: Diagnosis not present

## 2018-03-14 ENCOUNTER — Other Ambulatory Visit: Payer: Self-pay | Admitting: Student

## 2018-03-14 DIAGNOSIS — M47816 Spondylosis without myelopathy or radiculopathy, lumbar region: Secondary | ICD-10-CM

## 2018-03-21 DIAGNOSIS — C50212 Malignant neoplasm of upper-inner quadrant of left female breast: Secondary | ICD-10-CM | POA: Diagnosis not present

## 2018-03-24 ENCOUNTER — Other Ambulatory Visit: Payer: Self-pay | Admitting: Student

## 2018-03-24 ENCOUNTER — Ambulatory Visit
Admission: RE | Admit: 2018-03-24 | Discharge: 2018-03-24 | Disposition: A | Discharge status: Home / Self Care | Payer: PPO | Source: Ambulatory Visit | Attending: Student | Admitting: Student

## 2018-03-24 DIAGNOSIS — M47816 Spondylosis without myelopathy or radiculopathy, lumbar region: Secondary | ICD-10-CM

## 2018-03-24 DIAGNOSIS — M5416 Radiculopathy, lumbar region: Secondary | ICD-10-CM | POA: Diagnosis not present

## 2018-03-24 MED ORDER — METHYLPREDNISOLONE ACETATE 40 MG/ML INJ SUSP (RADIOLOG
120.0000 mg | Freq: Once | INTRAMUSCULAR | Status: AC
  Administered 2018-03-24: 120 mg via EPIDURAL

## 2018-03-24 MED ORDER — IOPAMIDOL (ISOVUE-M 200) INJECTION 41%
1.0000 mL | Freq: Once | INTRAMUSCULAR | Status: AC
  Administered 2018-03-24: 1 mL via EPIDURAL

## 2018-03-24 NOTE — Discharge Instructions (Signed)

## 2018-03-25 DIAGNOSIS — M25512 Pain in left shoulder: Secondary | ICD-10-CM | POA: Diagnosis not present

## 2018-03-25 DIAGNOSIS — G8929 Other chronic pain: Secondary | ICD-10-CM | POA: Diagnosis not present

## 2018-03-25 DIAGNOSIS — M6281 Muscle weakness (generalized): Secondary | ICD-10-CM | POA: Diagnosis not present

## 2018-04-28 DIAGNOSIS — M79675 Pain in left toe(s): Secondary | ICD-10-CM | POA: Diagnosis not present

## 2018-04-28 DIAGNOSIS — L600 Ingrowing nail: Secondary | ICD-10-CM | POA: Diagnosis not present

## 2018-04-28 DIAGNOSIS — M79674 Pain in right toe(s): Secondary | ICD-10-CM | POA: Diagnosis not present

## 2018-04-28 DIAGNOSIS — B351 Tinea unguium: Secondary | ICD-10-CM | POA: Diagnosis not present

## 2018-05-05 DIAGNOSIS — E119 Type 2 diabetes mellitus without complications: Secondary | ICD-10-CM | POA: Diagnosis not present

## 2018-05-05 LAB — HM DIABETES EYE EXAM

## 2018-05-09 ENCOUNTER — Encounter: Payer: Self-pay | Admitting: Family Medicine

## 2018-05-16 ENCOUNTER — Telehealth: Payer: Self-pay

## 2018-05-16 NOTE — Telephone Encounter (Signed)
Pt left v/m that she has 3 Losartan left and [facility] and [facility] [address]. I spoke with Casimer Bilis at [facility] and the losartan 100 mg was transferred to [facility] [address]. Pt notified and voiced understanding and will ck with [facility] [address]. Pt will cb if needed.

## 2018-05-17 MED ORDER — LOSARTAN POTASSIUM 100 MG PO TABS: 100.0000 mg | ORAL_TABLET | Freq: Every day | ORAL | 1 refills | Status: DC

## 2018-05-17 NOTE — Telephone Encounter (Signed)
E-scribed rx to [facility]

## 2018-05-17 NOTE — Addendum Note (Signed)
Addended by: Brenton Grills on: 6/62/9476 54:65 PM   Modules accepted: Orders

## 2018-05-17 NOTE — Telephone Encounter (Signed)
[facility] [address] left v/m [facility]. [facility] also left in [facility] that pt was told if could not get transferred that [facility]?Please advise.

## 2018-06-14 ENCOUNTER — Telehealth: Payer: Self-pay

## 2018-06-14 NOTE — Telephone Encounter (Signed)
WEBEX VISIT CONSENT OBTAINED VERBALLY.  YOUR CARDIOLOGY TEAM HAS ARRANGED FOR AN E-VISIT FOR YOUR APPOINTMENT - PLEASE REVIEW IMPORTANT INFORMATION BELOW SEVERAL DAYS PRIOR TO YOUR APPOINTMENT  Due to the recent COVID-19 pandemic, we are transitioning in-person office visits to tele-medicine visits in an effort to decrease unnecessary exposure to our patients, their families, and staff. Medicare and most insurances are covering these visits without a copay needed. We also encourage you to sign up for MyChart if you have not already done so. You will need a smartphone if possible. For patients that do not have this, we can still complete the visit using a regular telephone but do prefer a smartphone to enable video when possible. You may have a family member that lives with you that can help. If possible, we also ask that you have a blood pressure cuff and scale at home to measure your blood pressure, heart rate and weight prior to your scheduled appointment. Patients with clinical needs that need an in-person evaluation and testing will still be able to come to the office if absolutely necessary. If you have any questions, feel free to call our office.     YOUR PROVIDER WILL BE USING THE FOLLOWING PLATFORM TO COMPLETE YOUR VISIT: [place]  . IF USING WEBEX - How to Download the WebEx App to Your SmartPhone  - If Apple device, go to [employer] and type in WebEx in the search bar. [place] [employer], the blue/green circle. If Android, go to [employer] and type in [employer] in the search bar. The app is free but as with any other app download, your phone may require you to verify saved payment information or Apple/Android password.  - You do NOT have to create an account. - On the day of the visit, our staff will walk you through joining the meeting with the meeting number/password.  Paige Medina USING MYCHART - How to Download the MyChart App to Your SmartPhone   - If Apple, go to [employer] and  type in MyChart in the search bar and download the app. If Android, ask patient to go to [employer] and type in [place] in the search bar and download the app. The app is free but as with any other app downloads, your phone may require you to verify saved payment information or Apple/Android password.  - You will need to then log into the app with your MyChart username and password, and select [facility] as your healthcare provider to link the account. When it is time for your visit, go to the MyChart app, find appointments, and click Begin Video Visit. Be sure to Select Allow for your device to access the Microphone and Camera for your visit. You will then be connected, and your provider will be with you shortly.  **If you have any issues connecting or need assistance, please contact MyChart service desk [phone])**  **If using a computer, in order to ensure the best quality for your visit, you will need to use either of the following Internet Browsers: [employer], or [employer]**  . IF USING DOXIMITY or DOXY.ME - The staff will give you instructions on receiving your link to join the meeting the day of your visit.      2-3 DAYS BEFORE YOUR APPOINTMENT  You will receive a telephone call from one of our [facility] team members - your caller ID may say "Unknown caller." If this is a video visit, we will walk  you through how to set up your device to be able to complete the visit. We will remind you check your blood pressure, heart rate and weight prior to your scheduled appointment. If you have an Apple Watch or Kardia, please upload any pertinent ECG strips the day before or morning of your appointment to [place]. Our staff will also make sure you have reviewed the consent and agree to move forward with your scheduled tele-health visit.     THE DAY OF YOUR APPOINTMENT  Approximately 15 minutes prior to your scheduled appointment, you will receive a telephone call  from one of [facility] team - your caller ID may say "Unknown caller."  Our staff will confirm medications, vital signs for the day and any symptoms you may be experiencing. Please have this information available prior to the time of visit start. It may also be helpful for you to have a pad of paper and pen handy for any instructions given during your visit. They will also walk you through joining the smartphone meeting if this is a video visit.    CONSENT FOR TELE-HEALTH VISIT - PLEASE REVIEW  I hereby voluntarily request, consent and authorize [facility] and its employed or contracted physicians, physician assistants, nurse practitioners or other licensed health care professionals (the Practitioner), to provide me with telemedicine health care services (the "Services") as deemed necessary by the treating Practitioner. I acknowledge and consent to receive the Services by the Practitioner via telemedicine. I understand that the telemedicine visit will involve communicating with the Practitioner through live audiovisual communication technology and the disclosure of certain medical information by electronic transmission. I acknowledge that I have been given the opportunity to request an in-person assessment or other available alternative prior to the telemedicine visit and am voluntarily participating in the telemedicine visit.  I understand that I have the right to withhold or withdraw my consent to the use of telemedicine in the course of my care at any time, without affecting my right to future care or treatment, and that the Practitioner or I may terminate the telemedicine visit at any time. I understand that I have the right to inspect all information obtained and/or recorded in the course of the telemedicine visit and may receive copies of available information for a reasonable fee.  I understand that some of the potential risks of receiving the Services via telemedicine include:  [name] Delay or  interruption in medical evaluation due to technological equipment failure or disruption; . Information transmitted may not be sufficient (e.g. poor resolution of images) to allow for appropriate medical decision making by the Practitioner; and/or  . In rare instances, security protocols could fail, causing a breach of personal health information.  Furthermore, I acknowledge that it is my responsibility to provide information about my medical history, conditions and care that is complete and accurate to the best of my ability. I acknowledge that Practitioner's advice, recommendations, and/or decision may be based on factors not within their control, such as incomplete or inaccurate data provided by me or distortions of diagnostic images or specimens that may result from electronic transmissions. I understand that the practice of medicine is not an exact science and that Practitioner makes no warranties or guarantees regarding treatment outcomes. I acknowledge that I will receive a copy of this consent concurrently upon execution via email to the email address I last provided but may also request a printed copy by calling the office of [facility].    I understand that my  insurance will be billed for this visit.   I have read or had this consent read to me. . I understand the contents of this consent, which adequately explains the benefits and risks of the Services being provided via telemedicine.  . I have been provided ample opportunity to ask questions regarding this consent and the Services and have had my questions answered to my satisfaction. . I give my informed consent for the services to be provided through the use of telemedicine in my medical care  By participating in this telemedicine visit I agree to the above. Yes

## 2018-06-28 ENCOUNTER — Telehealth (INDEPENDENT_AMBULATORY_CARE_PROVIDER_SITE_OTHER): Payer: PPO | Admitting: Cardiovascular Disease

## 2018-06-28 ENCOUNTER — Encounter: Payer: Self-pay | Admitting: Cardiovascular Disease

## 2018-06-28 ENCOUNTER — Other Ambulatory Visit: Payer: Self-pay

## 2018-06-28 VITALS — BP 126/72 | HR 55 | Ht 65.0 in | Wt 145.0 lb

## 2018-06-28 DIAGNOSIS — I493 Ventricular premature depolarization: Secondary | ICD-10-CM | POA: Diagnosis not present

## 2018-06-28 DIAGNOSIS — I10 Essential (primary) hypertension: Secondary | ICD-10-CM

## 2018-06-28 MED ORDER — OLMESARTAN MEDOXOMIL 40 MG PO TABS: 40.0000 mg | ORAL_TABLET | Freq: Every day | ORAL | 6 refills | Status: DC
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
## 2018-06-28 NOTE — Patient Instructions (Signed)
Medication Instructions:  Stop taking losartan. Start olmesartan 40 mg once daily  If you need a refill on your cardiac medications before your next appointment, please call your pharmacy.   Lab work: None If you have labs (blood work) drawn today and your tests are completely normal, you will receive your results only by: [name] MyChart Message (if you have MyChart) OR . A paper copy in the mail If you have any lab test that is abnormal or we need to change your treatment, we will call you to review the results.  Testing/Procedures: None  Follow-Up: At [facility], you and your health needs are our priority.  As part of our continuing mission to provide you with exceptional heart care, we have created designated Provider Care Teams.  These Care Teams include your primary Cardiologist (physician) and Advanced Practice Providers (APPs -  Physician Assistants and Nurse Practitioners) who all work together to provide you with the care you need, when you need it. You will need a follow up appointment in 6 months.

## 2018-07-11 ENCOUNTER — Telehealth: Payer: Self-pay | Admitting: Cardiovascular Disease

## 2018-07-11 MED ORDER — AMLODIPINE BESYLATE 5 MG PO TABS: 5.0000 mg | ORAL_TABLET | Freq: Every day | ORAL | 3 refills | Status: DC

## 2018-07-11 MED ORDER — OLMESARTAN MEDOXOMIL 40 MG PO TABS: 40.0000 mg | ORAL_TABLET | Freq: Every day | ORAL | 0 refills | Status: DC

## 2018-07-11 NOTE — Telephone Encounter (Signed)
Pt is calling regarding her Benicar. Last night 114/60, this morning 150/76. Please call to discuss her BP.

## 2018-07-11 NOTE — Telephone Encounter (Signed)
The maximum dose of Benicar is 40 mg daily.  She should not take any more than that.  If blood pressure is not controlled in spite of the maximal dose, we should add amlodipine 5 mg once daily.

## 2018-07-11 NOTE — Telephone Encounter (Signed)
The patient has been called and made aware that she should not take more than 40 mg of Benicar daily and that is the maximum dose.   She was in agreement to start the Amlodipine 5 mg and will continue to keep track of her blood pressure. She will call back if anything further is needed.

## 2018-07-11 NOTE — Telephone Encounter (Signed)
Returned the call to the patient. She stated that she was recently started on Benicar 40 mg daily. Starting last Thursday she increased the dosage to 80 mg daily because her blood pressure had been staying in the 252'V systolic. On Friday it was 185/86. She took the 80 mg and a few hours later it was 165/79.   On Sunday she took the 100 mg of the Benicar and her blood pressure was 114/60. She did not have a reading prior to taking her Benicar.  Today her blood pressure was 150/76. She took her 80 mg Beincar and a few hours later it was 149/75.  She would like to know if she should be taking 100 mg daily. She has been asymptomatic with the increased pressures.

## 2018-07-11 NOTE — Telephone Encounter (Signed)
Attempted to call the patient back. Line was disconnected.

## 2018-07-20 ENCOUNTER — Telehealth: Payer: Self-pay | Admitting: Cardiovascular Disease

## 2018-07-20 NOTE — Telephone Encounter (Signed)
Pt c/o medication issue:  1. Name of Medication: benicar   2. How are you currently taking this medication (dosage and times per day)? 40 mg po q d   3. Are you having a reaction (difficulty breathing--STAT)? No   4. What is your medication issue? Patient reports improvement since starting this med   bp is now trending 110's-120's/ 60's and HR 50 -60   Highest bp reported 130/69 with back patient

## 2018-07-21 DIAGNOSIS — C50212 Malignant neoplasm of upper-inner quadrant of left female breast: Secondary | ICD-10-CM | POA: Diagnosis not present

## 2018-07-21 DIAGNOSIS — Z170 Estrogen receptor positive status [ER+]: Secondary | ICD-10-CM | POA: Diagnosis not present

## 2018-07-26 ENCOUNTER — Other Ambulatory Visit: Payer: Self-pay | Admitting: General Surgery

## 2018-07-26 DIAGNOSIS — N6320 Unspecified lump in the left breast, unspecified quadrant: Secondary | ICD-10-CM

## 2018-08-03 ENCOUNTER — Other Ambulatory Visit: Payer: Self-pay | Admitting: General Surgery

## 2018-08-03 DIAGNOSIS — N6320 Unspecified lump in the left breast, unspecified quadrant: Secondary | ICD-10-CM

## 2018-08-26 ENCOUNTER — Ambulatory Visit
Admission: RE | Admit: 2018-08-26 | Discharge: 2018-08-26 | Disposition: A | Discharge status: Home / Self Care | Payer: PPO | Source: Ambulatory Visit | Attending: General Surgery | Admitting: General Surgery

## 2018-08-26 ENCOUNTER — Other Ambulatory Visit: Payer: Self-pay

## 2018-08-26 DIAGNOSIS — Z853 Personal history of malignant neoplasm of breast: Secondary | ICD-10-CM | POA: Diagnosis not present

## 2018-08-26 DIAGNOSIS — N6489 Other specified disorders of breast: Secondary | ICD-10-CM | POA: Diagnosis not present

## 2018-08-26 DIAGNOSIS — N6320 Unspecified lump in the left breast, unspecified quadrant: Secondary | ICD-10-CM

## 2018-08-29 ENCOUNTER — Other Ambulatory Visit: Payer: Self-pay | Admitting: General Surgery

## 2018-08-29 DIAGNOSIS — R928 Other abnormal and inconclusive findings on diagnostic imaging of breast: Secondary | ICD-10-CM

## 2018-08-29 DIAGNOSIS — N6320 Unspecified lump in the left breast, unspecified quadrant: Secondary | ICD-10-CM

## 2018-09-06 ENCOUNTER — Other Ambulatory Visit: Payer: Self-pay

## 2018-09-06 ENCOUNTER — Ambulatory Visit
Admission: RE | Admit: 2018-09-06 | Discharge: 2018-09-06 | Disposition: A | Discharge status: Home / Self Care | Payer: PPO | Source: Ambulatory Visit | Attending: General Surgery | Admitting: General Surgery

## 2018-09-06 DIAGNOSIS — N6321 Unspecified lump in the left breast, upper outer quadrant: Secondary | ICD-10-CM | POA: Diagnosis not present

## 2018-09-06 DIAGNOSIS — N6320 Unspecified lump in the left breast, unspecified quadrant: Secondary | ICD-10-CM | POA: Diagnosis not present

## 2018-09-06 DIAGNOSIS — R928 Other abnormal and inconclusive findings on diagnostic imaging of breast: Secondary | ICD-10-CM | POA: Insufficient documentation

## 2018-09-06 DIAGNOSIS — Z853 Personal history of malignant neoplasm of breast: Secondary | ICD-10-CM | POA: Diagnosis not present

## 2018-09-07 LAB — SURGICAL PATHOLOGY

## 2018-09-22 ENCOUNTER — Other Ambulatory Visit: Payer: Self-pay | Admitting: Student

## 2018-09-22 DIAGNOSIS — M47816 Spondylosis without myelopathy or radiculopathy, lumbar region: Secondary | ICD-10-CM

## 2018-10-03 ENCOUNTER — Other Ambulatory Visit: Payer: Self-pay | Admitting: Family Medicine

## 2018-10-03 DIAGNOSIS — Z1231 Encounter for screening mammogram for malignant neoplasm of breast: Secondary | ICD-10-CM

## 2018-10-06 ENCOUNTER — Ambulatory Visit
Admission: RE | Admit: 2018-10-06 | Discharge: 2018-10-06 | Disposition: A | Discharge status: Home / Self Care | Payer: PPO | Source: Ambulatory Visit | Attending: Student | Admitting: Student

## 2018-10-06 DIAGNOSIS — M47816 Spondylosis without myelopathy or radiculopathy, lumbar region: Secondary | ICD-10-CM | POA: Diagnosis not present

## 2018-10-06 MED ORDER — METHYLPREDNISOLONE ACETATE 40 MG/ML INJ SUSP (RADIOLOG
120.0000 mg | Freq: Once | INTRAMUSCULAR | Status: AC
  Administered 2018-10-06: 120 mg via EPIDURAL

## 2018-10-06 MED ORDER — IOPAMIDOL (ISOVUE-M 200) INJECTION 41%
1.0000 mL | Freq: Once | INTRAMUSCULAR | Status: AC
  Administered 2018-10-06: 1 mL via EPIDURAL

## 2018-10-06 NOTE — Discharge Instructions (Signed)

## 2018-10-12 ENCOUNTER — Other Ambulatory Visit: Payer: Self-pay | Admitting: Family Medicine

## 2018-10-12 ENCOUNTER — Telehealth: Payer: Self-pay

## 2018-10-12 DIAGNOSIS — N183 Chronic kidney disease, stage 3 unspecified: Secondary | ICD-10-CM

## 2018-10-12 DIAGNOSIS — E785 Hyperlipidemia, unspecified: Secondary | ICD-10-CM

## 2018-10-12 DIAGNOSIS — E1122 Type 2 diabetes mellitus with diabetic chronic kidney disease: Secondary | ICD-10-CM

## 2018-10-12 DIAGNOSIS — E119 Type 2 diabetes mellitus without complications: Secondary | ICD-10-CM

## 2018-10-12 NOTE — Telephone Encounter (Signed)
Left detailed VM w COVID screen and back door lab info   

## 2018-10-14 ENCOUNTER — Other Ambulatory Visit (INDEPENDENT_AMBULATORY_CARE_PROVIDER_SITE_OTHER): Payer: PPO

## 2018-10-14 ENCOUNTER — Other Ambulatory Visit: Payer: Self-pay

## 2018-10-14 DIAGNOSIS — E119 Type 2 diabetes mellitus without complications: Secondary | ICD-10-CM

## 2018-10-14 DIAGNOSIS — N183 Chronic kidney disease, stage 3 (moderate): Secondary | ICD-10-CM

## 2018-10-14 DIAGNOSIS — E785 Hyperlipidemia, unspecified: Secondary | ICD-10-CM | POA: Diagnosis not present

## 2018-10-14 DIAGNOSIS — E1122 Type 2 diabetes mellitus with diabetic chronic kidney disease: Secondary | ICD-10-CM

## 2018-10-14 LAB — CBC WITH DIFFERENTIAL/PLATELET
Basophils Absolute: 0.2 10*3/uL — ABNORMAL HIGH (ref 0.0–0.1)
Basophils Relative: 1.7 % (ref 0.0–3.0)
Eosinophils Absolute: 0.4 10*3/uL (ref 0.0–0.7)
Eosinophils Relative: 4.2 % (ref 0.0–5.0)
HCT: 42.2 % (ref 36.0–46.0)
Hemoglobin: 13.9 g/dL (ref 12.0–15.0)
Lymphocytes Relative: 27.8 % (ref 12.0–46.0)
Lymphs Abs: 2.5 10*3/uL (ref 0.7–4.0)
MCHC: 32.9 g/dL (ref 30.0–36.0)
MCV: 93.5 fl (ref 78.0–100.0)
Monocytes Absolute: 0.6 10*3/uL (ref 0.1–1.0)
Monocytes Relative: 6.9 % (ref 3.0–12.0)
Neutro Abs: 5.3 10*3/uL (ref 1.4–7.7)
Neutrophils Relative %: 59.4 % (ref 43.0–77.0)
Platelets: 236 10*3/uL (ref 150.0–400.0)
RBC: 4.51 Mil/uL (ref 3.87–5.11)
RDW: 13.5 % (ref 11.5–15.5)
WBC: 8.9 10*3/uL (ref 4.0–10.5)

## 2018-10-14 LAB — LIPID PANEL
Cholesterol: 170 mg/dL (ref 0–200)
HDL: 61.2 mg/dL (ref >39.00)
LDL Cholesterol: 93 mg/dL (ref 0–99)
NonHDL: 109.13
Total CHOL/HDL Ratio: 3
Triglycerides: 83 mg/dL (ref 0.0–149.0)
VLDL: 16.6 mg/dL (ref 0.0–40.0)

## 2018-10-14 LAB — COMPREHENSIVE METABOLIC PANEL
ALT: 15 U/L (ref 0–35)
AST: 17 U/L (ref 0–37)
Albumin: 4.2 g/dL (ref 3.5–5.2)
Alkaline Phosphatase: 74 U/L (ref 39–117)
BUN: 22 mg/dL (ref 6–23)
CO2: 30 mEq/L (ref 19–32)
Calcium: 9.5 mg/dL (ref 8.4–10.5)
Chloride: 104 mEq/L (ref 96–112)
Creatinine, Ser: 1.08 mg/dL (ref 0.40–1.20)
GFR: 48.4 mL/min — ABNORMAL LOW (ref >60.00)
Glucose, Bld: 109 mg/dL — ABNORMAL HIGH (ref 70–99)
Potassium: 4.8 mEq/L (ref 3.5–5.1)
Sodium: 140 mEq/L (ref 135–145)
Total Bilirubin: 0.5 mg/dL (ref 0.2–1.2)
Total Protein: 7.2 g/dL (ref 6.0–8.3)

## 2018-10-14 LAB — VITAMIN D 25 HYDROXY (VIT D DEFICIENCY, FRACTURES): VITD: 49.15 ng/mL (ref 30.00–100.00)

## 2018-10-14 LAB — HEMOGLOBIN A1C: Hgb A1c MFr Bld: 6.9 % — ABNORMAL HIGH (ref 4.6–6.5)

## 2018-10-21 ENCOUNTER — Ambulatory Visit (INDEPENDENT_AMBULATORY_CARE_PROVIDER_SITE_OTHER): Payer: PPO | Admitting: Family Medicine

## 2018-10-21 ENCOUNTER — Ambulatory Visit: Payer: PPO

## 2018-10-21 ENCOUNTER — Other Ambulatory Visit: Payer: Self-pay

## 2018-10-21 ENCOUNTER — Encounter: Payer: Self-pay | Admitting: Family Medicine

## 2018-10-21 ENCOUNTER — Other Ambulatory Visit: Payer: Self-pay | Admitting: Family Medicine

## 2018-10-21 VITALS — BP 138/68 | HR 56 | Temp 98.1°F | Ht 65.5 in | Wt 154.1 lb

## 2018-10-21 DIAGNOSIS — Z0000 Encounter for general adult medical examination without abnormal findings: Secondary | ICD-10-CM | POA: Diagnosis not present

## 2018-10-21 DIAGNOSIS — E119 Type 2 diabetes mellitus without complications: Secondary | ICD-10-CM

## 2018-10-21 DIAGNOSIS — Z170 Estrogen receptor positive status [ER+]: Secondary | ICD-10-CM

## 2018-10-21 DIAGNOSIS — C50212 Malignant neoplasm of upper-inner quadrant of left female breast: Secondary | ICD-10-CM

## 2018-10-21 DIAGNOSIS — M545 Low back pain, unspecified: Secondary | ICD-10-CM

## 2018-10-21 DIAGNOSIS — I10 Essential (primary) hypertension: Secondary | ICD-10-CM

## 2018-10-21 DIAGNOSIS — G8929 Other chronic pain: Secondary | ICD-10-CM

## 2018-10-21 DIAGNOSIS — E785 Hyperlipidemia, unspecified: Secondary | ICD-10-CM

## 2018-10-21 DIAGNOSIS — E1122 Type 2 diabetes mellitus with diabetic chronic kidney disease: Secondary | ICD-10-CM

## 2018-10-21 DIAGNOSIS — N289 Disorder of kidney and ureter, unspecified: Secondary | ICD-10-CM

## 2018-10-21 DIAGNOSIS — Z853 Personal history of malignant neoplasm of breast: Secondary | ICD-10-CM

## 2018-10-21 LAB — POC URINALSYSI DIPSTICK (AUTOMATED)
Bilirubin, UA: NEGATIVE
Blood, UA: NEGATIVE
Glucose, UA: NEGATIVE
Ketones, UA: NEGATIVE
Leukocytes, UA: NEGATIVE
Nitrite, UA: NEGATIVE
Protein, UA: NEGATIVE
Spec Grav, UA: 1.01 (ref 1.010–1.025)
Urobilinogen, UA: 0.2 E.U./dL
pH, UA: 6 (ref 5.0–8.0)

## 2018-10-21 MED ORDER — MICONAZOLE NITRATE 2 % EX CREA: 1.0000 "application " | TOPICAL_CREAM | Freq: Every day | CUTANEOUS | 3 refills | Status: DC

## 2018-10-21 MED ORDER — TRIAMCINOLONE ACETONIDE 0.1 % EX CREA: 1.0000 "application " | TOPICAL_CREAM | Freq: Two times a day (BID) | CUTANEOUS | 3 refills | Status: DC | PRN

## 2018-10-21 NOTE — Assessment & Plan Note (Addendum)
Chronic, stable. Reviewed with patient. Encouraged good hydration status.  

## 2018-10-21 NOTE — Assessment & Plan Note (Signed)
Preventative protocols reviewed and updated unless pt declined. Discussed healthy diet and lifestyle.  

## 2018-10-21 NOTE — Assessment & Plan Note (Signed)

## 2018-10-21 NOTE — Assessment & Plan Note (Signed)
Chronic, diet controlled trending up. RTC 6 mo f/u visit.

## 2018-10-21 NOTE — Assessment & Plan Note (Signed)
Appreciate onc/surgery care. S/p L mastectomy. Did not need chemo.

## 2018-10-21 NOTE — Progress Notes (Signed)
This visit was conducted in person.  BP 138/68 (BP Location: Right Arm, Patient Position: Sitting, Cuff Size: Normal)   Pulse (!) 56   Temp 98.1 F (36.7 C) (Temporal)   Ht 5' 5.5" (1.664 m)   Wt 154 lb 1 oz (69.9 kg)   SpO2 98%   BMI 25.25 kg/m    CC: CPE/AMW Subjective:    Patient ID: Paige Medina, female    DOB: [birth year], 85 y.o.   MRN: [id number]  HPI: Paige Medina is a 85 y.o. female presenting on 10/21/2018 for Medicare Wellness   Did not see health advisor this year.  2nd L breast cancer s/p mastectomy last year [name]).   Bosniak IIF L kidney lesion - planned monitoring. Will check UA today.   Ongoing back pain - seeing Dr Margarita Rana PMR with shots into back. Has found that regular exercise at gym was helpful, struggling since gyms closed. [facility].    Hearing Screening   125Hz  250Hz  500Hz  1000Hz  2000Hz  3000Hz  4000Hz  6000Hz  8000Hz   Right ear:           Left ear:           Comments: Last ear exam, 10/2018.  Received bilateral hearing aids  Vision Screening Comments: Last eye exam, 04/2018    Office Visit from 10/21/2018 in [place] [facility]  PHQ-2 Total Score  0      Fall Risk  10/21/2018 10/12/2017 10/07/2016 09/27/2015 10/02/2014  Falls in the past year? 0 Yes Yes No Yes  Comment - fall occurred while walking up stairs; denies injury pt reports tripping over a cord which resulted in a fall - -  Number falls in past yr: - 1 1 - 1  Comment - - - - Stepped of curb wrong  Injury with Fall? - No No - No      Preventative: COLONOSCOPY10/2016 mod diverticulosis o/w WNL, f/u prn [name]) Well woman - s/p hysterectomy 1977, ovaries remain Breast cancer - see above. Mammogram scheduled for next month.  Dexa - 08/2012 - WNL Flu yearly Pneumovax 06/2005. prevnar 2015 Td 06/2005  zostavax 04/2005  shingrix - discussed Advanced directives:Scanned into chart8/2016. [name] husband then Ray Church and Wille Glaser are [facility]. Does not  want prolonged life support if terminal or irreversible condition.  Seat belt use discussed.  Sunscreen use discussed. No changing moles on skin.  Non smoker.  Alcohol - once a month Dentist Q6 mo  Eye exam - yearly  Bowel - chronic constipation managed with stool softener and other bowel regimen Bladder - no incontinence  Lives with husband, no pets  Grown children  Occupation: retired, varied jobs Veterinary surgeon  Activity: gym 3d/wk, active mentally with crosswords  Diet: good water, fruits/vegetables daily     Relevant past medical, surgical, family and social history reviewed and updated as indicated. Interim medical history since our last visit reviewed. Allergies and medications reviewed and updated. Outpatient Medications Prior to Visit  Medication Sig Dispense Refill  . acetaminophen (TYLENOL 8 HOUR ARTHRITIS PAIN) 650 MG CR tablet Take 1,300 mg by mouth every 8 (eight) hours as needed for pain.    . beta carotene w/minerals (OCUVITE) tablet Take 1 tablet by mouth every other day.    . Blood Glucose Monitoring Suppl (ONE TOUCH ULTRA 2) w/Device KIT Use as directed 1 each 0  . Calcium-Phosphorus-Vitamin D (CITRACAL +D3 PO) Take 1 tablet by mouth daily with breakfast.     . clobetasol (  TEMOVATE) 0.05 % external solution Apply 1 application topically 2 (two) times daily.    . clobetasol cream (TEMOVATE) 7.79 % Apply 1 application topically 2 (two) times daily. 30 g 1  . Coenzyme Q10 (CO Q-10) 200 MG CAPS Take 200 mg by mouth every morning.     . docusate sodium (COLACE) 100 MG capsule Take 100 mg by mouth at bedtime.    Mariane Baumgarten Sodium (STOOL SOFTENER LAXATIVE PO) Take by mouth daily.    [name] doxycycline (ADOXA) 50 MG tablet Take 50 mg by mouth daily as needed (for rosacea).     [name] estradiol (ESTRACE) 0.1 MG/GM vaginal cream Place 1 Applicatorful vaginally 2 (two) times a week. 42.5 g 3  . glucose blood test strip Use as instructed (ultrablue 2 strips) E11.9 100 each 3  . Krill  Oil 500 MG CAPS Take 500 mg by mouth every morning.     . Lancets (ONETOUCH ULTRASOFT) lancets Use as instructed E11.9 100 each 3  . lovastatin (MEVACOR) 40 MG tablet Take 1 tablet (40 mg total) by mouth at bedtime. 90 tablet 3  . naproxen sodium (ALEVE) 220 MG tablet Take 220 mg by mouth 2 (two) times daily as needed (for back pain).     [name] olmesartan (BENICAR) 40 MG tablet Take 1 tablet (40 mg total) by mouth daily. 90 tablet 0  . Probiotic Product (PROBIOTIC DAILY PO) Take 1 capsule by mouth at bedtime.     . Turmeric 500 MG CAPS Take 500 mg by mouth every other day.     . miconazole (MICOTIN) 2 % cream Apply 1 application topically daily.    [name] amLODipine (NORVASC) 5 MG tablet Take 1 tablet (5 mg total) by mouth daily. 30 tablet 3  . HYDROcodone-acetaminophen (NORCO/VICODIN) 5-325 MG tablet Take 1-2 tablets by mouth every 6 (six) hours as needed for moderate pain or severe pain. (Patient not taking: Reported on 06/28/2018) 15 tablet 0   No facility-administered medications prior to visit.      Per HPI unless specifically indicated in ROS section below Review of Systems  Constitutional: Negative for activity change, appetite change, chills, fatigue, fever and unexpected weight change.  HENT: Negative for hearing loss.   Eyes: Negative for visual disturbance.  Respiratory: Negative for cough, chest tightness, shortness of breath and wheezing.   Cardiovascular: Negative for chest pain, palpitations and leg swelling.  Gastrointestinal: Negative for abdominal distention, abdominal pain, blood in stool, constipation, diarrhea, nausea and vomiting.  Genitourinary: Negative for difficulty urinating and hematuria.  Musculoskeletal: Negative for arthralgias, myalgias and neck pain.  Skin: Negative for rash.  Neurological: Negative for dizziness, seizures, syncope and headaches.  Hematological: Negative for adenopathy. Bruises/bleeds easily.  Psychiatric/Behavioral: Negative for dysphoric mood. The  patient is not nervous/anxious.    Objective:    BP 138/68 (BP Location: Right Arm, Patient Position: Sitting, Cuff Size: Normal)   Pulse (!) 56   Temp 98.1 F (36.7 C) (Temporal)   Ht 5' 5.5" (1.664 m)   Wt 154 lb 1 oz (69.9 kg)   SpO2 98%   BMI 25.25 kg/m   Wt Readings from Last 3 Encounters:  10/21/18 154 lb 1 oz (69.9 kg)  06/28/18 145 lb (65.8 kg)  01/12/18 151 lb (68.5 kg)    Physical Exam Vitals signs and nursing note reviewed.  Constitutional:      General: She is not in acute distress.    Appearance: Normal appearance. She is well-developed. She is not ill-appearing.  HENT:     Head: Normocephalic and atraumatic.     Right Ear: Hearing, tympanic membrane, ear canal and external ear normal.     Left Ear: Hearing, tympanic membrane, ear canal and external ear normal.     Nose: Nose normal.     Mouth/Throat:     Mouth: Mucous membranes are moist.     Pharynx: Uvula midline. No oropharyngeal exudate or posterior oropharyngeal erythema.  Eyes:     General: No scleral icterus.    Extraocular Movements: Extraocular movements intact.     Conjunctiva/sclera: Conjunctivae normal.     Pupils: Pupils are equal, round, and reactive to light.  Neck:     Musculoskeletal: Normal range of motion and neck supple.  Cardiovascular:     Rate and Rhythm: Normal rate and regular rhythm.     Pulses: Normal pulses.          Radial pulses are 2+ on the right side and 2+ on the left side.     Heart sounds: Murmur (3/6 systolic USB) present.  Pulmonary:     Effort: Pulmonary effort is normal. No respiratory distress.     Breath sounds: Normal breath sounds. No wheezing, rhonchi or rales.  Abdominal:     General: Abdomen is flat. Bowel sounds are normal. There is no distension.     Palpations: Abdomen is soft. There is no mass.     Tenderness: There is no abdominal tenderness. There is no guarding or rebound.     Hernia: No hernia is present.  Musculoskeletal: Normal range of motion.   Lymphadenopathy:     Cervical: No cervical adenopathy.  Skin:    General: Skin is warm and dry.     Findings: No rash.  Neurological:     General: No focal deficit present.     Mental Status: She is alert and oriented to person, place, and time.     Comments:  CN grossly intact, station and gait intact Recall 3/3 Calculation 5/5 D-[id number]  Psychiatric:        Mood and Affect: Mood normal.        Behavior: Behavior normal.        Thought Content: Thought content normal.        Judgment: Judgment normal.       Results for orders placed or performed in visit on 10/14/18  CBC with Differential/Platelet  Result Value Ref Range   WBC 8.9 4.0 - 10.5 K/uL   RBC 4.51 3.87 - 5.11 Mil/uL   Hemoglobin 13.9 12.0 - 15.0 g/dL   HCT 42.2 36.0 - 46.0 %   MCV 93.5 78.0 - 100.0 fl   MCHC 32.9 30.0 - 36.0 g/dL   RDW 13.5 11.5 - 15.5 %   Platelets 236.0 150.0 - 400.0 K/uL   Neutrophils Relative % 59.4 43.0 - 77.0 %   Lymphocytes Relative 27.8 12.0 - 46.0 %   Monocytes Relative 6.9 3.0 - 12.0 %   Eosinophils Relative 4.2 0.0 - 5.0 %   Basophils Relative 1.7 0.0 - 3.0 %   Neutro Abs 5.3 1.4 - 7.7 K/uL   Lymphs Abs 2.5 0.7 - 4.0 K/uL   Monocytes Absolute 0.6 0.1 - 1.0 K/uL   Eosinophils Absolute 0.4 0.0 - 0.7 K/uL   Basophils Absolute 0.2 (H) 0.0 - 0.1 K/uL  VITAMIN D 25 Hydroxy (Vit-D Deficiency, Fractures)  Result Value Ref Range   VITD 49.15 30.00 - 100.00 ng/mL  Hemoglobin A1c  Result Value Ref Range  Hgb A1c MFr Bld 6.9 (H) 4.6 - 6.5 %  Comprehensive metabolic panel  Result Value Ref Range   Sodium 140 135 - 145 mEq/L   Potassium 4.8 3.5 - 5.1 mEq/L   Chloride 104 96 - 112 mEq/L   CO2 30 19 - 32 mEq/L   Glucose, Bld 109 (H) 70 - 99 mg/dL   BUN 22 6 - 23 mg/dL   Creatinine, Ser 1.08 0.40 - 1.20 mg/dL   Total Bilirubin 0.5 0.2 - 1.2 mg/dL   Alkaline Phosphatase 74 39 - 117 U/L   AST 17 0 - 37 U/L   ALT 15 0 - 35 U/L   Total Protein 7.2 6.0 - 8.3 g/dL   Albumin 4.2 3.5 - 5.2  g/dL   Calcium 9.5 8.4 - 10.5 mg/dL   GFR 48.40 (L) >60.00 mL/min  Lipid panel  Result Value Ref Range   Cholesterol 170 0 - 200 mg/dL   Triglycerides 83.0 0.0 - 149.0 mg/dL   HDL 61.20 >39.00 mg/dL   VLDL 16.6 0.0 - 40.0 mg/dL   LDL Cholesterol 93 0 - 99 mg/dL   Total CHOL/HDL Ratio 3    NonHDL 109.13    Assessment & Plan:  Requests letter for [facility] for herself and husband.  Problem List Items Addressed This Visit    Medicare annual wellness visit, subsequent - Primary    I have personally reviewed the Medicare Annual Wellness questionnaire and have noted 1. The patient's medical and social history 2. Their use of alcohol, tobacco or illicit drugs 3. Their current medications and supplements 4. The patient's functional ability including ADL's, fall risks, home safety risks and hearing or visual impairment. Cognitive function has been assessed and addressed as indicated.  5. Diet and physical activity 6. Evidence for depression or mood disorders The patients weight, height, BMI have been recorded in the chart. I have made referrals, counseling and provided education to the patient based on review of the above and I have provided the pt with a written personalized care plan for preventive services. Provider list updated.. See scanned questionairre as needed for further documentation. Reviewed preventative protocols and updated unless pt declined.       Lesion of left native kidney    Reviewed with patient. She desires to postpone f/u, consider next year. Will check UA to ensure no microhematuria.       Hypertension    Chronic, stable. Continue current regimen.       Hyperlipidemia    Chronic, stable. Continue current regimen.  The ASCVD Risk score Mikey Bussing DC Jr., et al., 2013) failed to calculate for the following reasons:   The 2013 ASCVD risk score is only valid for ages 35 to 10       History of breast cancer   Health maintenance examination    Preventative protocols  reviewed and updated unless pt declined. Discussed healthy diet and lifestyle.       Diabetes mellitus without complication (HCC)    Chronic, diet controlled trending up. RTC 6 mo f/u visit.       CKD stage 3 due to type 2 diabetes mellitus (HCC)    Chronic, stable. Reviewed with patient. Encouraged good hydration status.       Chronic low back pain    Followed by PMR s/p steroid injections      Breast cancer, left ([place])    Appreciate onc/surgery care. S/p L mastectomy. Did not need chemo.  Meds ordered this encounter  Medications  . miconazole (MICOTIN) 2 % cream    Sig: Apply 1 application topically daily.    Dispense:  28.35 g    Refill:  3  . triamcinolone cream (KENALOG) 0.1 %    Sig: Apply 1 application topically 2 (two) times daily as needed.    Dispense:  30 g    Refill:  3   No orders of the defined types were placed in this encounter.   Follow up plan: Return in about 6 months (around 04/23/2019), or if symptoms worsen or fail to improve, for follow up visit.  [name], MD

## 2018-10-21 NOTE — Assessment & Plan Note (Addendum)
Followed by PM&R s/p steroid injections 

## 2018-10-21 NOTE — Assessment & Plan Note (Signed)
Reviewed with patient. She desires to postpone f/u, consider next year. Will check UA to ensure no microhematuria.

## 2018-10-21 NOTE — Patient Instructions (Addendum)
Check BP Urinalysis today If interested, check with pharmacy about new 2 shot shingles series (shingrix).  You are doing well today Return as needed or in 6 months for diabetes follow up visit.  Health Maintenance After Age 83 After age 61, you are at a higher risk for certain long-term diseases and infections as well as injuries from falls. Falls are a major cause of broken bones and head injuries in people who are older than age 65. Getting regular preventive care can help to keep you healthy and well. Preventive care includes getting regular testing and making lifestyle changes as recommended by your health care provider. Talk with your health care provider about:  Which screenings and tests you should have. A screening is a test that checks for a disease when you have no symptoms.  A diet and exercise plan that is right for you. What should I know about screenings and tests to prevent falls? Screening and testing are the best ways to find a health problem early. Early diagnosis and treatment give you the best chance of managing medical conditions that are common after age 77. Certain conditions and lifestyle choices may make you more likely to have a fall. Your health care provider may recommend:  Regular vision checks. Poor vision and conditions such as cataracts can make you more likely to have a fall. If you wear glasses, make sure to get your prescription updated if your vision changes.  Medicine review. Work with your health care provider to regularly review all of the medicines you are taking, including over-the-counter medicines. Ask your health care provider about any side effects that may make you more likely to have a fall. Tell your health care provider if any medicines that you take make you feel dizzy or sleepy.  Osteoporosis screening. Osteoporosis is a condition that causes the bones to get weaker. This can make the bones weak and cause them to break more easily.  Blood  pressure screening. Blood pressure changes and medicines to control blood pressure can make you feel dizzy.  Strength and balance checks. Your health care provider may recommend certain tests to check your strength and balance while standing, walking, or changing positions.  Foot health exam. Foot pain and numbness, as well as not wearing proper footwear, can make you more likely to have a fall.  Depression screening. You may be more likely to have a fall if you have a fear of falling, feel emotionally low, or feel unable to do activities that you used to do.  Alcohol use screening. Using too much alcohol can affect your balance and may make you more likely to have a fall. What actions can I take to lower my risk of falls? General instructions  Talk with your health care provider about your risks for falling. Tell your health care provider if: ? You fall. Be sure to tell your health care provider about all falls, even ones that seem minor. ? You feel dizzy, sleepy, or off-balance.  Take over-the-counter and prescription medicines only as told by your health care provider. These include any supplements.  Eat a healthy diet and maintain a healthy weight. A healthy diet includes low-fat dairy products, low-fat (lean) meats, and fiber from whole grains, beans, and lots of fruits and vegetables. Home safety  Remove any tripping hazards, such as rugs, cords, and clutter.  Install safety equipment such as grab bars in bathrooms and safety rails on stairs.  Keep rooms and walkways well-lit. Activity  Follow a regular exercise program to stay fit. This will help you maintain your balance. Ask your health care provider what types of exercise are appropriate for you.  If you need a cane or walker, use it as recommended by your health care provider.  Wear supportive shoes that have nonskid soles. Lifestyle  Do not drink alcohol if your health care provider tells you not to drink.  If you  drink alcohol, limit how much you have: ? 0-1 drink a day for women. ? 0-2 drinks a day for men.  Be aware of how much alcohol is in your drink. In the [place] one drink equals one typical bottle of beer (12 oz), one-half glass of wine (5 oz), or one shot of hard liquor (1 oz).  Do not use any products that contain nicotine or tobacco, such as cigarettes and e-cigarettes. If you need help quitting, ask your health care provider. Summary  Having a healthy lifestyle and getting preventive care can help to protect your health and wellness after age 57.  Screening and testing are the best way to find a health problem early and help you avoid having a fall. Early diagnosis and treatment give you the best chance for managing medical conditions that are more common for people who are older than age 54.  Falls are a major cause of broken bones and head injuries in people who are older than age 83. Take precautions to prevent a fall at home.  Work with your health care provider to learn what changes you can make to improve your health and wellness and to prevent falls. This information is not intended to replace advice given to you by your health care provider. Make sure you discuss any questions you have with your health care provider. Document Released: 12/30/2016 Document Revised: 06/09/2018 Document Reviewed: 12/30/2016 Elsevier Patient Education  2020 [employer].

## 2018-10-21 NOTE — Assessment & Plan Note (Signed)
Chronic, stable. Continue current regimen. 

## 2018-10-21 NOTE — Addendum Note (Signed)
Addended by: Brenton Grills on: [id number] AM   Modules accepted: Orders

## 2018-10-21 NOTE — Assessment & Plan Note (Signed)
Chronic, stable. Continue current regimen.  The ASCVD Risk score Paige Bussing DC Jr., et al., 2013) failed to calculate for the following reasons:   The 2013 ASCVD risk score is only valid for ages 34 to 53

## 2018-10-31 ENCOUNTER — Other Ambulatory Visit: Payer: Self-pay | Admitting: Cardiovascular Disease

## 2018-11-01 ENCOUNTER — Other Ambulatory Visit: Payer: Self-pay

## 2018-11-01 MED ORDER — AMLODIPINE BESYLATE 5 MG PO TABS: 5.0000 mg | ORAL_TABLET | Freq: Every day | ORAL | 3 refills | Status: DC

## 2018-11-15 ENCOUNTER — Ambulatory Visit (INDEPENDENT_AMBULATORY_CARE_PROVIDER_SITE_OTHER): Payer: PPO

## 2018-11-15 DIAGNOSIS — Z23 Encounter for immunization: Secondary | ICD-10-CM

## 2018-11-21 ENCOUNTER — Ambulatory Visit
Admission: RE | Admit: 2018-11-21 | Discharge: 2018-11-21 | Disposition: A | Discharge status: Home / Self Care | Payer: PPO | Source: Ambulatory Visit | Attending: Family Medicine | Admitting: Family Medicine

## 2018-11-21 ENCOUNTER — Other Ambulatory Visit: Payer: Self-pay | Admitting: Cardiovascular Disease

## 2018-11-21 DIAGNOSIS — Z1231 Encounter for screening mammogram for malignant neoplasm of breast: Secondary | ICD-10-CM | POA: Diagnosis not present

## 2018-11-21 DIAGNOSIS — M7062 Trochanteric bursitis, left hip: Secondary | ICD-10-CM | POA: Diagnosis not present

## 2018-11-21 LAB — HM MAMMOGRAPHY

## 2018-11-21 MED ORDER — OLMESARTAN MEDOXOMIL 40 MG PO TABS: 40.0000 mg | ORAL_TABLET | Freq: Every day | ORAL | 0 refills | Status: DC

## 2018-11-21 NOTE — Telephone Encounter (Signed)
Requested Prescriptions   Signed Prescriptions Disp Refills  . olmesartan (BENICAR) 40 MG tablet 90 tablet 0    Sig: Take 1 tablet (40 mg total) by mouth daily. APPOINTMENT NEEDED FOR FURTHER REFILLS.    Authorizing Provider: [name] A    Ordering User: Raelene Bott, Ivannia Willhelm L

## 2018-11-21 NOTE — Telephone Encounter (Signed)
°*  STAT* If patient is at the pharmacy, call can be transferred to refill team.   1. Which medications need to be refilled? (please list name of each medication and dose if known)    olmesartan 40 mg po q d   2. Which pharmacy/location (including street and city if local pharmacy) is medication to be sent to?  [facility] [address] [place]   3. Do they need a 30 day or 90 day supply? [place]

## 2018-11-22 ENCOUNTER — Encounter: Payer: Self-pay | Admitting: Family Medicine

## 2019-03-16 DIAGNOSIS — L820 Inflamed seborrheic keratosis: Secondary | ICD-10-CM | POA: Diagnosis not present

## 2019-03-16 DIAGNOSIS — Z853 Personal history of malignant neoplasm of breast: Secondary | ICD-10-CM | POA: Diagnosis not present

## 2019-03-16 DIAGNOSIS — L700 Acne vulgaris: Secondary | ICD-10-CM | POA: Diagnosis not present

## 2019-03-16 DIAGNOSIS — L821 Other seborrheic keratosis: Secondary | ICD-10-CM | POA: Diagnosis not present

## 2019-03-16 DIAGNOSIS — Z1283 Encounter for screening for malignant neoplasm of skin: Secondary | ICD-10-CM | POA: Diagnosis not present

## 2019-03-16 DIAGNOSIS — L578 Other skin changes due to chronic exposure to nonionizing radiation: Secondary | ICD-10-CM | POA: Diagnosis not present

## 2019-03-16 DIAGNOSIS — L719 Rosacea, unspecified: Secondary | ICD-10-CM | POA: Diagnosis not present

## 2019-03-16 DIAGNOSIS — Z85828 Personal history of other malignant neoplasm of skin: Secondary | ICD-10-CM | POA: Diagnosis not present

## 2019-03-20 ENCOUNTER — Other Ambulatory Visit: Payer: Self-pay

## 2019-03-20 MED ORDER — OLMESARTAN MEDOXOMIL 40 MG PO TABS: 40.0000 mg | ORAL_TABLET | Freq: Every day | ORAL | 0 refills | Status: DC
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
## 2019-03-21 ENCOUNTER — Other Ambulatory Visit: Payer: Self-pay

## 2019-03-21 ENCOUNTER — Ambulatory Visit: Payer: PPO | Admitting: Physician Assistant

## 2019-03-21 ENCOUNTER — Telehealth: Payer: Self-pay | Admitting: Cardiovascular Disease

## 2019-03-21 ENCOUNTER — Encounter: Payer: Self-pay | Admitting: Physician Assistant

## 2019-03-21 VITALS — BP 120/58 | HR 67 | Ht 65.5 in | Wt 145.0 lb

## 2019-03-21 DIAGNOSIS — I471 Supraventricular tachycardia: Secondary | ICD-10-CM | POA: Diagnosis not present

## 2019-03-21 DIAGNOSIS — I493 Ventricular premature depolarization: Secondary | ICD-10-CM | POA: Diagnosis not present

## 2019-03-21 DIAGNOSIS — I5189 Other ill-defined heart diseases: Secondary | ICD-10-CM

## 2019-03-21 DIAGNOSIS — E782 Mixed hyperlipidemia: Secondary | ICD-10-CM | POA: Diagnosis not present

## 2019-03-21 DIAGNOSIS — I10 Essential (primary) hypertension: Secondary | ICD-10-CM

## 2019-03-21 NOTE — Telephone Encounter (Signed)
Patient called in to make office aware she would always like a 90 day supply.

## 2019-03-21 NOTE — Patient Instructions (Signed)
Medication Instructions:  - Your physician recommends that you continue on your current medications as directed. Please refer to the Current Medication list given to you today.  *If you need a refill on your cardiac medications before your next appointment, please call your pharmacy*  Lab Work: - none ordered  If you have labs (blood work) drawn today and your tests are completely normal, you will receive your results only by: [name] MyChart Message (if you have MyChart) OR . A paper copy in the mail If you have any lab test that is abnormal or we need to change your treatment, we will call you to review the results.  Testing/Procedures: - none ordered  Follow-Up: At [facility], you and your health needs are our priority.  As part of our continuing mission to provide you with exceptional heart care, we have created designated Provider Care Teams.  These Care Teams include your primary Cardiologist (physician) and Advanced Practice Providers (APPs -  Physician Assistants and Nurse Practitioners) who all work together to provide you with the care you need, when you need it.  Your next appointment:   6 month(s)  The format for your next appointment:   In Person  Provider:   [name], MD/ Christell Faith, PA  Other Instructions N/A

## 2019-04-13 ENCOUNTER — Telehealth: Payer: Self-pay | Admitting: Family Medicine

## 2019-04-13 DIAGNOSIS — L821 Other seborrheic keratosis: Secondary | ICD-10-CM | POA: Diagnosis not present

## 2019-04-13 DIAGNOSIS — L820 Inflamed seborrheic keratosis: Secondary | ICD-10-CM | POA: Diagnosis not present

## 2019-04-13 NOTE — Chronic Care Management (AMB) (Signed)
  Chronic Care Management   Note  04/13/2019 Name: Paige Medina MRN: [id number] DOB: 1933-05-30  Paige Medina is a 86 y.o. year old female who is a primary care patient of [name], MD. I reached out to Paige Medina by phone today in response to a referral sent by Paige Medina PCP, [name], MD.   Paige Medina was given information about Chronic Care Management services today including:  1. CCM service includes personalized support from designated clinical staff supervised by her physician, including individualized plan of care and coordination with other care providers 2. 24/7 contact phone numbers for assistance for urgent and routine care needs. 3. Service will only be billed when office clinical staff spend 20 minutes or more in a month to coordinate care. 4. Only one practitioner may furnish and bill the service in a calendar month. 5. The patient may stop CCM services at any time (effective at the end of the month) by phone call to the office staff. 6. The patient will be responsible for cost sharing (co-pay) of up to 20% of the service fee (after annual deductible is met).  Patient agreed to services and verbal consent obtained.   Follow up plan:   Raynicia Dukes [employer] Scheduler

## 2019-04-17 NOTE — Chronic Care Management (AMB) (Signed)
Chronic Care Management Pharmacy  Name: Paige Medina  MRN: [id number] DOB: [birth year]  Chief Complaint/ HPI  Paige Medina,  86 y.o. , female presents for their Initial CCM visit with the clinical pharmacist in office.  PCP : [name], MD  Their chronic conditions include: HTN, HLD, DM, CKD, arthritis, osteopenia, chronic low back pain, breast cancer   Patient concerns: recommendation for OTC or natural medications for toenail fungus - currently using tea tree oil and Vicks once or twice daily, working well -> recommend continue current treatment  Office Visits:   10/21/18: Danise Mina, AWV - no medication changes, recommend Shingrix  Consult Visit:  03/21/19: Christell Faith, Cardiology - HTN f/u much improved 120s/60s on amlodipine and olmesartan, no medication changes  Allergies  Allergen Reactions  . Lidocaine Itching, Swelling and Other (See Comments)    Blisters, tetracaine okay for epidural injections  . Lipitor [Atorvastatin] Other (See Comments)    myalgias  . Latex Other (See Comments)    Blisters  . Ciprofloxacin Nausea Only  . Oxycodone Nausea Only and Other (See Comments)    constipation  . Tape Other (See Comments)    Blisters--(Paper or cloth tape okay)  . Tegaderm Ag Mesh [Silver] Rash  . Tramadol Nausea Only and Other (See Comments)    constipation   Medications: Outpatient Encounter Medications as of 04/18/2019  Medication Sig  . acetaminophen (TYLENOL 8 HOUR ARTHRITIS PAIN) 650 MG CR tablet Take 1,300 mg by mouth every 8 (eight) hours as needed for pain.  [name] amLODipine (NORVASC) 5 MG tablet Take 1 tablet (5 mg total) by mouth daily.  . beta carotene w/minerals (OCUVITE) tablet Take 1 tablet by mouth every other day.  . Blood Glucose Monitoring Suppl (ONE TOUCH ULTRA 2) w/Device KIT Use as directed  . Calcium-Phosphorus-Vitamin D (CITRACAL +D3 PO) Take 1 tablet by mouth daily with breakfast.   . clobetasol (TEMOVATE) 0.05 % external solution  Apply 1 application topically 2 (two) times daily.  . clobetasol cream (TEMOVATE) 2.72 % Apply 1 application topically 2 (two) times daily.  . Coenzyme Q10 (CO Q-10) 200 MG CAPS Take 200 mg by mouth every morning.   . docusate sodium (COLACE) 100 MG capsule Take 100 mg by mouth at bedtime.  [name] doxycycline (ADOXA) 50 MG tablet Take 50 mg by mouth daily as needed (for rosacea).   [name] estradiol (ESTRACE) 0.1 MG/GM vaginal cream Place 1 Applicatorful vaginally 2 (two) times a week.  [name] glucose blood test strip Use as instructed (ultrablue 2 strips) E11.9  . Krill Oil 500 MG CAPS Take 500 mg by mouth every morning.   . Lancets (ONETOUCH ULTRASOFT) lancets Use as instructed E11.9  . lovastatin (MEVACOR) 40 MG tablet TAKE 1 TABLET BY MOUTH EVERY DAY  . miconazole (MICOTIN) 2 % cream Apply 1 application topically daily.  . naproxen sodium (ALEVE) 220 MG tablet Take 220 mg by mouth 2 (two) times daily as needed (for back pain).   [name] olmesartan (BENICAR) 40 MG tablet Take 1 tablet (40 mg total) by mouth daily. APPOINTMENT NEEDED FOR FURTHER REFILLS.  . Probiotic Product (PROBIOTIC DAILY PO) Take 1 capsule by mouth at bedtime.   . triamcinolone cream (KENALOG) 0.1 % Apply 1 application topically 2 (two) times daily as needed.  . Turmeric 500 MG CAPS Take 500 mg by mouth every other day.    No facility-administered encounter medications on file as of 04/18/2019.    Current Diagnosis/Assessment: Goals    .  Increase physical activity     Starting 10/12/2017, I will continue exercising for at least 60 min 3 days per week.     [name] Pharmacy Care Plan     Current Barriers:  . Chronic Disease Management support, education, and care coordination needs related to hypertension, hyperlipidemia, diabetes, chronic kidney disease, arthritis, osteopenia, chronic low back pain  Pharmacist Clinical Goal(s):  . Receive recommended preventative care. Recommend 2 doses of the Shingrix vaccine. . Reduce pill burden. May  discontinue krill oil supplement as omega-3 acids are supplied in Ocuvite, eating fish weekly, and triglycerides are within goal. . Limit medication cost. Reviewed ivermectin cream 1% alternatives and the ivermectin lotion 0.5% is available and is likely much lower cost. It may be available over the counter by the time of your next refill.  . Achieve recommended daily intake of calcium for osteopenia (1000-1200 mg daily) including dietary intake. Increase Citracal supplement from 1 caplet to 2 caplets daily.   Interventions: . Comprehensive medication review performed.  Patient Self Care Activities:  . Uses pillbox, takes medications as prescribed . Exercises for 30 minutes three days a week . Self-monitors blood glucose and blood pressure  Initial goal documentation        Hyperlipidemia   Lipid Panel     Component Value Date/Time   CHOL 170 10/14/2018 0803   TRIG 83.0 10/14/2018 0803   TRIG 243 03/15/2013 0000   HDL 61.20 10/14/2018 0803   CHOLHDL 3 10/14/2018 0803   VLDL 16.6 10/14/2018 0803   LDLCALC 93 10/14/2018 0803   LDLCALC 50 03/15/2013 0000  LDL goal < 100 Patient has failed these meds in past: none reported Patient is currently controlled on the following medications:   Lovastatin 40 mg - take one tablet daily at bedtime  Krill oil 500 mg - 1 tablet daily  We discussed:  Denies adverse effects; Patient eats fish once or twice weekly and is getting omega-3 fatty acids in Ocuvite. We discussed discontinuing krill oil to reduce cost/pill burden  Plan: Continue lovastatin daily. May discontinue krill oil to reduce pill burden.  Diabetes   Recent Relevant Labs: Lab Results  Component Value Date/Time   HGBA1C 6.9 (H) 10/14/2018 08:03 AM   HGBA1C 6.8 (H) 10/12/2017 08:35 AM   MICROALBUR 3.2 (H) 10/12/2017 08:35 AM   MICROALBUR 0.1 06/13/2013 03:17 PM    Checking BG: occasionally  Recent FBG Readings: reports 1 elevation of 150 mg/dL, the next few days  returned to range of 90-130 Patient has failed these meds in past: none Patient is currently controlled on the following medications:   No pharmacotherapy  Last diabetic eye exam:  Lab Results  Component Value Date/Time   HMDIABEYEEXA No Retinopathy 05/05/2018 12:00 AM    We discussed: patient has lost 10 lbs since March 2020, eats lean meats and vegetables  Plan: Continue control with diet and exercise   Hypertension   Office blood pressures are  BP Readings from Last 3 Encounters:  03/21/19 (!) 120/58  10/21/18 138/68  10/06/18 (!) 197/74   CMP Latest Ref Rng & Units 10/14/2018 10/19/2017 10/12/2017  Glucose 70 - 99 mg/dL 109(H) 115(H) 143(H)  BUN 6 - 23 mg/dL 22 26(H) 18  Creatinine 0.40 - 1.20 mg/dL 1.08 1.21(H) 1.15  Sodium 135 - 145 mEq/L 140 140 140  Potassium 3.5 - 5.1 mEq/L 4.8 4.5 4.9  Chloride 96 - 112 mEq/L 104 101 105  CO2 19 - 32 mEq/L 30 28 29   Calcium 8.4 -  10.5 mg/dL 9.5 10.3 9.8  Total Protein 6.0 - 8.3 g/dL 7.2 - 7.4  Total Bilirubin 0.2 - 1.2 mg/dL 0.5 - 0.7  Alkaline Phos 39 - 117 U/L 74 - 73  AST 0 - 37 U/L 17 - 19  ALT 0 - 35 U/L 15 - 19   Patient has failed these meds in the past: no beta blocker due to bradycardia, losartan (hypotension, fluctuations), Benicar (cost)  Patient checks BP at home when feeling symptomatic Patient home BP readings are ranging: previously 120s/60s, since well controlled, stopped home monitoring  Patient is currently controlled on the following medications:   Olmesartan 40 mg - take 1 tablet daily (AM)  Amlodipine 5 mg - take 1 tablet daily (AM) We discussed: denies adverse effects  Plan: Continue current medications      Arthritis/Lower back pain  History of bulging disc, bursitis in hip, spinal stenosis Patient has failed these meds in past: tylenol (ineffective), oxycodone (constipation) Patient is currently controlled on the following medications:   Naproxen 220 mg - 1 tablet as needed (uses sparingly, ~once  weekly)  Steroid injections - every 3-6 months in back or hip  Tumeric 500 mg - 1 tablet daily We discussed:  Pain has increased since stopped exercising with gyms closed due to pandemic. She is working back into previous exercise routine and uses the treadmill for about 25 minutes three days a week.  Plan: Continue current medications   Osteopenia  Bone density (02/10/18): T-score (forearm) -2.2  Vitamin D (10/05/16): 36  Patient has failed these meds in past:  none Patient is currently controlled on the following medications:  (Citracal) Calcium citrate-Vitamin D 400 mg/500 IU - 1 caplet daily  Vitamin D 5000 IU - 1 capsule every other day We discussed:  Increasing Citracal to 2 caplets daily  Plan: Increase CitraCal to 2 caplets daily to acheive recommended daily calcium intake  Medication Management  Misc: clobetasol 0.05% cream (PRN vaginal dermatitis), doxycycline 50 mg (PRN rosacea), triamcinolone cream 0.1% (PRN eczema)  No longer taking: estradiol 0.60m/gm vaginal cream, miconazole 2% cream  OTCs: Ocuvite QOD (dry eyes), CoQ10 200 mg QD (heart), docusate 100 mg BID, probiotic daily  Pharmacy: [facility], [facility] [address] preferred  Adherence: uses pillbox, no concerns  Affordability: Ivermectin cream is $90 for 1 tube (could switch to lotion at next refill, may be less expensive, also should be available OTC soon - 0.5% ivermectin lotion)  CCM Follow Up: Monday, October 02, 2019 at 2:00 PM (telephone)  MDebbora Dus PharmD Clinical Pharmacist [email or web address] [facility] at [phone]

## 2019-04-18 ENCOUNTER — Telehealth: Payer: Self-pay

## 2019-04-18 ENCOUNTER — Ambulatory Visit: Payer: PPO

## 2019-04-18 ENCOUNTER — Telehealth: Payer: PPO

## 2019-04-18 DIAGNOSIS — E785 Hyperlipidemia, unspecified: Secondary | ICD-10-CM

## 2019-04-18 DIAGNOSIS — M85832 Other specified disorders of bone density and structure, left forearm: Secondary | ICD-10-CM

## 2019-04-18 DIAGNOSIS — N183 Chronic kidney disease, stage 3 unspecified: Secondary | ICD-10-CM

## 2019-04-18 DIAGNOSIS — G8929 Other chronic pain: Secondary | ICD-10-CM

## 2019-04-18 DIAGNOSIS — M1990 Unspecified osteoarthritis, unspecified site: Secondary | ICD-10-CM

## 2019-04-18 DIAGNOSIS — I10 Essential (primary) hypertension: Secondary | ICD-10-CM

## 2019-04-18 DIAGNOSIS — E119 Type 2 diabetes mellitus without complications: Secondary | ICD-10-CM

## 2019-04-18 DIAGNOSIS — E1122 Type 2 diabetes mellitus with diabetic chronic kidney disease: Secondary | ICD-10-CM

## 2019-04-18 DIAGNOSIS — M545 Low back pain, unspecified: Secondary | ICD-10-CM

## 2019-04-18 NOTE — Patient Instructions (Addendum)
April 18, 2019  Dear [name],  It was a pleasure meeting you during our initial appointment on April 18, 2019. Below is a summary of the goals we discussed and components of chronic care management. Please contact me anytime with questions or concerns.   Visit Information  Goals Addressed            This Visit's Progress   . Increase physical activity   On track    Starting 10/12/2017, I will continue exercising for at least 60 min 3 days per week.     [name] Pharmacy Care Plan       Current Barriers:  . Chronic Disease Management support, education, and care coordination needs related to hypertension, hyperlipidemia, diabetes, chronic kidney disease, arthritis, osteopenia, chronic low back pain  Pharmacist Clinical Goal(s):  . Receive recommended preventative care. Recommend 2 doses of the Shingrix vaccine. . Reduce pill burden. May discontinue krill oil supplement as omega-3 acids are supplied in Ocuvite, eating fish weekly, and triglycerides are within goal. . Limit medication cost. Reviewed ivermectin cream 1% alternatives and the ivermectin lotion 0.5% is available and is likely much lower cost. It may be available over the counter by the time of your next refill.  . Achieve recommended daily intake of calcium for osteopenia (1000-1200 mg daily) including dietary intake. Increase Citracal supplement from 1 caplet to 2 caplets daily.   Interventions: . Comprehensive medication review performed.  Patient Self Care Activities:  . Uses pillbox, takes medications as prescribed . Exercises for 30 minutes three days a week . Self-monitors blood glucose and blood pressure  Initial goal documentation        Ms. Vorhees was given information about Chronic Care Management services today including:  1. CCM service includes personalized support from designated clinical staff supervised by her physician, including individualized plan of care and coordination with other care  providers 2. 24/7 contact phone numbers for assistance for urgent and routine care needs. 3. Service will only be billed when office clinical staff spend 20 minutes or more in a month to coordinate care. 4. Only one practitioner may furnish and bill the service in a calendar month. 5. The patient may stop CCM services at any time (effective at the end of the month) by phone call to the office staff. 6. The patient will be responsible for cost sharing (co-pay) of up to 20% of the service fee (after annual deductible is met).  Patient agreed to services and verbal consent obtained.   Telephone follow up appointment with pharmacy team member scheduled for: October 02, 2019 2:00 PM  [name], PharmD Clinical Pharmacist [facility] [phone]

## 2019-04-18 NOTE — Telephone Encounter (Signed)
I would like to request a referral for MIASIA HEADMAN to chronic care management pharmacy services focusing on the following conditions:   Essential hypertension, benign  [I10]  Hyperlipidemia [E78.5]  [name], PharmD Clinical Pharmacist [facility] at [facility] [phone]

## 2019-04-25 ENCOUNTER — Other Ambulatory Visit: Payer: Self-pay

## 2019-04-25 ENCOUNTER — Encounter: Payer: Self-pay | Admitting: Family Medicine

## 2019-04-25 ENCOUNTER — Ambulatory Visit (INDEPENDENT_AMBULATORY_CARE_PROVIDER_SITE_OTHER): Payer: PPO | Admitting: Family Medicine

## 2019-04-25 VITALS — BP 140/66 | HR 51 | Temp 97.8°F | Ht 65.5 in | Wt 144.1 lb

## 2019-04-25 DIAGNOSIS — E1122 Type 2 diabetes mellitus with diabetic chronic kidney disease: Secondary | ICD-10-CM | POA: Diagnosis not present

## 2019-04-25 DIAGNOSIS — N281 Cyst of kidney, acquired: Secondary | ICD-10-CM

## 2019-04-25 DIAGNOSIS — N183 Chronic kidney disease, stage 3 unspecified: Secondary | ICD-10-CM | POA: Diagnosis not present

## 2019-04-25 DIAGNOSIS — N289 Disorder of kidney and ureter, unspecified: Secondary | ICD-10-CM

## 2019-04-25 DIAGNOSIS — E119 Type 2 diabetes mellitus without complications: Secondary | ICD-10-CM | POA: Diagnosis not present

## 2019-04-25 DIAGNOSIS — M85832 Other specified disorders of bone density and structure, left forearm: Secondary | ICD-10-CM | POA: Diagnosis not present

## 2019-04-25 LAB — POCT GLYCOSYLATED HEMOGLOBIN (HGB A1C): Hemoglobin A1C: 6.3 % — AB (ref 4.0–5.6)

## 2019-04-25 MED ORDER — CALCIUM CITRATE-VITAMIN D 500-400 MG-UNIT PO CHEW: 1.0000 | CHEWABLE_TABLET | Freq: Every day | ORAL | Status: DC

## 2019-04-25 NOTE — Assessment & Plan Note (Signed)
UA was normal last year. Update MRI.

## 2019-04-25 NOTE — Assessment & Plan Note (Addendum)
Chronic, improved with noted weight loss - now in prediabetes range! Congratulated. Continue healthy changes to date.

## 2019-04-25 NOTE — Patient Instructions (Addendum)
You are doing well today! Continue healthy diet and exercise routine.  We will schedule abdominal MRI to follow kidney cyst - if stable may stop checking.  Return as needed or in 6 months for wellness visit/physical.

## 2019-04-25 NOTE — Addendum Note (Signed)
Addended by: [name] on: 04/25/2019 01:24 PM   Modules accepted: Orders

## 2019-04-25 NOTE — Progress Notes (Addendum)
This visit was conducted in person.  BP 140/66 (BP Location: Right Arm, Patient Position: Sitting, Cuff Size: Normal)   Pulse (!) 51   Temp 97.8 F (36.6 C) (Temporal)   Ht 5' 5.5" (1.664 m)   Wt 144 lb 1 oz (65.3 kg)   SpO2 100%   BMI 23.61 kg/m    CC: DM 6 mo f/u visit Subjective:    Patient ID: Paige Medina, female    DOB: 1933-05-23, 86 y.o.   MRN: [id number]  HPI: Paige Medina is a 86 y.o. female presenting on 04/25/2019 for Diabetes (Here for 6 mo f/u.)   First chronic care management phone call by [facility] completed last week, note reviewed. Krill oil was discontinued as she regularly eats fish and takes Ocuvite (omega-3 fatty acids in this).   10 lb weight loss since last visit! Has restarted going to [facility].   Bosniak IIF L kidney lesion - planned monitoring. Due for recheck. Would like at [facility].   Onychomycosis - has seen podiatrist. Using tea tree oil and vicks without much benefit.   DM - does regularly check sugars one elevated but overall 90-130 fasting. Compliant with antihyperglycemic regimen which includes: diet controlled. Denies low sugars or hypoglycemic symptoms. Denies paresthesias. Last diabetic eye exam 05/2018. Pneumovax: 2007. Prevnar: 2015. Glucometer brand: one-touch Ultra2. DSME: 2016. Lab Results  Component Value Date   HGBA1C 6.3 (A) 04/25/2019   Diabetic Foot Exam - Simple   Simple Foot Form Diabetic Foot exam was performed with the following findings: Yes 04/25/2019  8:35 AM  Visual Inspection See comments: Yes Sensation Testing Intact to touch and monofilament testing bilaterally: Yes Pulse Check Posterior Tibialis and Dorsalis pulse intact bilaterally: Yes Comments Bilateral onychomycotic great toes     Lab Results  Component Value Date   MICROALBUR 3.2 (H) 10/12/2017         Relevant past medical, surgical, family and social history reviewed and updated as indicated. Interim medical history since our last  visit reviewed. Allergies and medications reviewed and updated. Outpatient Medications Prior to Visit  Medication Sig Dispense Refill  . amLODipine (NORVASC) 5 MG tablet Take 1 tablet (5 mg total) by mouth daily. 90 tablet 3  . beta carotene w/minerals (OCUVITE) tablet Take 1 tablet by mouth every other day.    . Blood Glucose Monitoring Suppl (ONE TOUCH ULTRA 2) w/Device KIT Use as directed 1 each 0  . Cholecalciferol 125 MCG (5000 UT) TABS Take 1 tablet by mouth every other day.    . clobetasol (TEMOVATE) 0.05 % external solution Apply 1 application topically 2 (two) times daily.    . clobetasol cream (TEMOVATE) 8.65 % Apply 1 application topically 2 (two) times daily. 30 g 1  . Coenzyme Q10 (CO Q-10) 200 MG CAPS Take 200 mg by mouth every morning.     . docusate sodium (COLACE) 100 MG capsule Take 100 mg by mouth at bedtime.    [name] doxycycline (ADOXA) 50 MG tablet Take 50 mg by mouth daily as needed (for rosacea).     [name] glucose blood test strip Use as instructed (ultrablue 2 strips) E11.9 100 each 3  . Ivermectin 1 % CREA Apply topically at bedtime.    . Lancets (ONETOUCH ULTRASOFT) lancets Use as instructed E11.9 100 each 3  . lovastatin (MEVACOR) 40 MG tablet TAKE 1 TABLET BY MOUTH EVERY DAY 90 tablet 3  . miconazole (MICOTIN) 2 % cream Apply 1 application topically daily.  28.35 g 3  . naproxen sodium (ALEVE) 220 MG tablet Take 220 mg by mouth 2 (two) times daily as needed (for back pain).     [name] olmesartan (BENICAR) 40 MG tablet Take 1 tablet (40 mg total) by mouth daily. APPOINTMENT NEEDED FOR FURTHER REFILLS. 30 tablet 0  . Probiotic Product (PROBIOTIC DAILY PO) Take 1 capsule by mouth at bedtime.     . triamcinolone cream (KENALOG) 0.1 % Apply 1 application topically 2 (two) times daily as needed. 30 g 3  . Turmeric 500 MG CAPS Take 500 mg by mouth every other day.     . calcium citrate-vitamin D 500-400 MG-UNIT chewable tablet Chew 2 tablets by mouth daily.    .  Calcium-Phosphorus-Vitamin D (CITRACAL +D3 PO) Take 1 tablet by mouth daily with breakfast.     . acetaminophen (TYLENOL 8 HOUR ARTHRITIS PAIN) 650 MG CR tablet Take 1,300 mg by mouth every 8 (eight) hours as needed for pain.    [name] estradiol (ESTRACE) 0.1 MG/GM vaginal cream Place 1 Applicatorful vaginally 2 (two) times a week. (Patient not taking: Reported on 04/18/2019) 42.5 g 3  . Krill Oil 500 MG CAPS Take 500 mg by mouth every morning.      No facility-administered medications prior to visit.     Per HPI unless specifically indicated in ROS section below Review of Systems Objective:    BP 140/66 (BP Location: Right Arm, Patient Position: Sitting, Cuff Size: Normal)   Pulse (!) 51   Temp 97.8 F (36.6 C) (Temporal)   Ht 5' 5.5" (1.664 m)   Wt 144 lb 1 oz (65.3 kg)   SpO2 100%   BMI 23.61 kg/m   Wt Readings from Last 3 Encounters:  04/25/19 144 lb 1 oz (65.3 kg)  03/21/19 145 lb (65.8 kg)  10/21/18 154 lb 1 oz (69.9 kg)    Physical Exam Vitals and nursing note reviewed.  Constitutional:      General: She is not in acute distress.    Appearance: Normal appearance. She is well-developed. She is not ill-appearing.  Eyes:     General: No scleral icterus.    Extraocular Movements: Extraocular movements intact.     Conjunctiva/sclera: Conjunctivae normal.     Pupils: Pupils are equal, round, and reactive to light.  Cardiovascular:     Rate and Rhythm: Normal rate and regular rhythm.     Pulses: Normal pulses.     Heart sounds: Normal heart sounds. No murmur.  Pulmonary:     Effort: Pulmonary effort is normal. No respiratory distress.     Breath sounds: Normal breath sounds. No wheezing, rhonchi or rales.  Musculoskeletal:     Cervical back: Normal range of motion and neck supple.     Right lower leg: No edema.     Left lower leg: No edema.     Comments:  See HPI for foot exam if done Mild chronic left leg swelling  Lymphadenopathy:     Cervical: No cervical adenopathy.    Skin:    General: Skin is warm and dry.     Findings: No rash.  Neurological:     Mental Status: She is alert.  Psychiatric:        Mood and Affect: Mood normal.        Behavior: Behavior normal.       Results for orders placed or performed in visit on 04/25/19  POCT glycosylated hemoglobin (Hb A1C)  Result Value Ref Range  Hemoglobin A1C 6.3 (A) 4.0 - 5.6 %   HbA1c POC (<> result, manual entry)     HbA1c, POC (prediabetic range)     HbA1c, POC (controlled diabetic range)     Assessment & Plan:  This visit occurred during the SARS-CoV-2 public health emergency.  Safety protocols were in place, including screening questions prior to the visit, additional usage of staff PPE, and extensive cleaning of exam room while observing appropriate contact time as indicated for disinfecting solutions.   Problem List Items Addressed This Visit    Osteopenia of left forearm    She prefers not to increase calcium to 2 tab/day due to increase in constipation noted.  Reviewed dietary calcium intake recommendations.       Lesion of left native kidney    UA was normal last year. Update MRI.       Diabetes mellitus without complication (HCC) - Primary    Chronic, improved with noted weight loss - now in prediabetes range! Congratulated. Continue healthy changes to date.       Relevant Orders   POCT glycosylated hemoglobin (Hb A1C) (Completed)   Renal function panel   CKD stage 3 due to type 2 diabetes mellitus ([place])   Relevant Orders   Renal function panel    Other Visit Diagnoses    Acquired cyst of kidney       Relevant Orders   MR Abdomen W Wo Contrast       Meds ordered this encounter  Medications  . calcium citrate-vitamin D 500-400 MG-UNIT chewable tablet    Sig: Chew 1 tablet by mouth daily.   Orders Placed This Encounter  Procedures  . MR Abdomen W Wo Contrast    Standing Status:   Future    Standing Expiration Date:   06/22/2020    Order Specific Question:   **  REASON FOR EXAM (FREE TEXT)    Answer:   f/u knkown bosniak 7F kidney cyst    Order Specific Question:   If indicated for the ordered procedure, I authorize the administration of contrast media per Radiology protocol    Answer:   Yes    Order Specific Question:   What is the patient's sedation requirement?    Answer:   No Sedation    Order Specific Question:   Does the patient have a pacemaker or implanted devices?    Answer:   No    Order Specific Question:   Radiology Contrast Protocol - do NOT remove file path    Answer:   _[email or web address]    Order Specific Question:   Preferred imaging location?    Answer:   [facility] (table limit-400lbs)  . Renal function panel    Standing Status:   Future    Standing Expiration Date:   04/24/2020  . POCT glycosylated hemoglobin (Hb A1C)    Patient Instructions  You are doing well today! Continue healthy diet and exercise routine.  We will schedule abdominal MRI to follow kidney cyst - if stable may stop checking.  Return as needed or in 6 months for wellness visit/physical.    Follow up plan: Return in about 6 months (around 10/23/2019) for annual exam, prior fasting for blood work, medicare wellness visit.  [name], MD

## 2019-04-25 NOTE — Assessment & Plan Note (Signed)
She prefers not to increase calcium to 2 tab/day due to increase in constipation noted.  Reviewed dietary calcium intake recommendations.

## 2019-04-26 ENCOUNTER — Other Ambulatory Visit (INDEPENDENT_AMBULATORY_CARE_PROVIDER_SITE_OTHER): Payer: PPO

## 2019-04-26 DIAGNOSIS — N183 Chronic kidney disease, stage 3 unspecified: Secondary | ICD-10-CM

## 2019-04-26 DIAGNOSIS — E119 Type 2 diabetes mellitus without complications: Secondary | ICD-10-CM

## 2019-04-26 DIAGNOSIS — E1122 Type 2 diabetes mellitus with diabetic chronic kidney disease: Secondary | ICD-10-CM | POA: Diagnosis not present

## 2019-04-26 LAB — RENAL FUNCTION PANEL
Albumin: 4.1 g/dL (ref 3.5–5.2)
BUN: 25 mg/dL — ABNORMAL HIGH (ref 6–23)
CO2: 28 mEq/L (ref 19–32)
Calcium: 9.8 mg/dL (ref 8.4–10.5)
Chloride: 103 mEq/L (ref 96–112)
Creatinine, Ser: 1.15 mg/dL (ref 0.40–1.20)
GFR: 44.96 mL/min — ABNORMAL LOW (ref >60.00)
Glucose, Bld: 114 mg/dL — ABNORMAL HIGH (ref 70–99)
Phosphorus: 3.8 mg/dL (ref 2.3–4.6)
Potassium: 4.9 mEq/L (ref 3.5–5.1)
Sodium: 138 mEq/L (ref 135–145)

## 2019-05-08 ENCOUNTER — Ambulatory Visit: Payer: PPO

## 2019-05-08 DIAGNOSIS — E119 Type 2 diabetes mellitus without complications: Secondary | ICD-10-CM | POA: Diagnosis not present

## 2019-05-08 LAB — HM DIABETES EYE EXAM

## 2019-05-09 ENCOUNTER — Ambulatory Visit
Admission: RE | Admit: 2019-05-09 | Discharge: 2019-05-09 | Disposition: A | Discharge status: Home / Self Care | Payer: PPO | Source: Ambulatory Visit | Attending: Family Medicine | Admitting: Family Medicine

## 2019-05-09 ENCOUNTER — Other Ambulatory Visit: Payer: Self-pay

## 2019-05-09 DIAGNOSIS — N281 Cyst of kidney, acquired: Secondary | ICD-10-CM | POA: Diagnosis not present

## 2019-05-09 MED ORDER — GADOBUTROL 1 MMOL/ML IV SOLN
3.0000 mL | Freq: Once | INTRAVENOUS | Status: AC | PRN
  Administered 2019-05-09: 6 mL via INTRAVENOUS

## 2019-05-12 ENCOUNTER — Encounter: Payer: Self-pay | Admitting: Family Medicine

## 2019-06-06 DIAGNOSIS — C50212 Malignant neoplasm of upper-inner quadrant of left female breast: Secondary | ICD-10-CM | POA: Diagnosis not present

## 2019-06-14 ENCOUNTER — Other Ambulatory Visit: Payer: Self-pay

## 2019-06-14 ENCOUNTER — Encounter: Payer: Self-pay | Admitting: Dermatology

## 2019-06-14 ENCOUNTER — Ambulatory Visit: Payer: PPO | Admitting: Dermatology

## 2019-06-14 DIAGNOSIS — L820 Inflamed seborrheic keratosis: Secondary | ICD-10-CM | POA: Diagnosis not present

## 2019-06-14 DIAGNOSIS — L821 Other seborrheic keratosis: Secondary | ICD-10-CM

## 2019-06-14 DIAGNOSIS — M67449 Ganglion, unspecified hand: Secondary | ICD-10-CM

## 2019-06-14 DIAGNOSIS — L578 Other skin changes due to chronic exposure to nonionizing radiation: Secondary | ICD-10-CM

## 2019-06-14 DIAGNOSIS — L719 Rosacea, unspecified: Secondary | ICD-10-CM | POA: Diagnosis not present

## 2019-06-14 DIAGNOSIS — M67441 Ganglion, right hand: Secondary | ICD-10-CM

## 2019-06-14 MED ORDER — DOXYCYCLINE HYCLATE 100 MG PO TABS: 100.0000 mg | ORAL_TABLET | Freq: Every day | ORAL | 3 refills | Status: DC

## 2019-06-14 NOTE — Progress Notes (Signed)
Follow-Up Visit   Subjective  Paige Medina is a 86 y.o. female who presents for the following: Follow-up (Inflamed SK follow up of back X 25 treated with LN2.), Other (Spots of face x few weeks. Look like abrasions. Has used her creams on them but they are not going away.), and Other (Spot of right index finger x >1 year. Keeps getting bigger. Worried it may be affecting nail.).  Her itching on the back is improved after treatment of inflamed seborrheic keratoses.  She still has some itching and would like to treat additional symptomatic lesions.  She has persistence and enlargement of the digital mucous cyst on her finger and it is causing an indentation in her nail.  She would like to pursue treatment.  Her rosacea both on her scalp face and eyes is doing well on oral doxycycline.  The following portions of the chart were reviewed this encounter and updated as appropriate: Tobacco  Allergies  Meds  Problems  Med Hx  Surg Hx  Fam Hx      Review of Systems: No other skin or systemic complaints.  Objective  Well appearing patient in no apparent distress; mood and affect are within normal limits.  A focused examination was performed including back, chest, face, arms. Relevant physical exam findings are noted in the Assessment and Plan.  Objective  Right Distal 2nd Finger: Cystic papule with associated linear nail dystrophy.  Objective  Back (19): Erythematous keratotic or waxy stuck-on papule or plaque.   Objective  Face: Dilated blood vessels.  Assessment & Plan    Actinic Damage - diffuse scaly erythematous macules with underlying dyspigmentation - Recommend daily broad spectrum sunscreen SPF 30+ to sun-exposed areas, reapply every 2 hours as needed.  - Call for new or changing lesions.  Seborrheic Keratoses - Stuck-on, waxy, tan-brown papules and plaques  - Discussed benign etiology and prognosis. - Observe - Call for any changes   Digital mucous cyst Right  Distal 2nd Finger  Discussed no treatment vs I&D vs evaluation by orthopedic surgeon.  Incision and Drainage - Right Distal 2nd Finger Location: right index finger  Informed Consent: Discussed risks (permanent scarring, light or dark discoloration, infection, pain, bleeding, bruising, redness, damage to adjacent structures, and recurrence of the lesion) and benefits of the procedure, as well as the alternatives.  Informed consent was obtained.  Preparation: The area was prepped with alcohol.  Anesthesia: Lidocaine 1% with epinephrine  Procedure Details: An incision was made overlying the lesion. The lesion drained clear fluid.  A small amount of fluid was drained.    Antibiotic ointment and a sterile pressure dressing were applied. The patient tolerated procedure well.  Total number of lesions drained: 1  Plan: The patient was instructed on post-op care. Recommend OTC analgesia as needed for pain.   Inflamed seborrheic keratosis (19) Back  Destruction of lesion - Back Complexity: simple   Destruction method: cryotherapy   Informed consent: discussed and consent obtained   Timeout:  patient name, date of birth, surgical site, and procedure verified Lesion destroyed using liquid nitrogen: Yes   Region frozen until ice ball extended beyond lesion: Yes   Outcome: patient tolerated procedure well with no complications   Post-procedure details: wound care instructions given    Rosacea Face Facial skin, scalp and ocular rosacea with telangiectasias on the cheeks Continue Metrogel qd.  Discussed BBL treatments.  doxycycline (VIBRA-TABS) 100 MG tablet - Face  Return in about 1 year (around 06/13/2020).  I, Ashok Cordia, CMA, am acting as scribe for Sarina Ser, MD . Documentation: I have reviewed the above documentation for accuracy and completeness, and I agree with the above.  Sarina Ser, MD

## 2019-06-14 NOTE — Patient Instructions (Signed)

## 2019-07-17 DIAGNOSIS — M7061 Trochanteric bursitis, right hip: Secondary | ICD-10-CM | POA: Diagnosis not present

## 2019-07-17 DIAGNOSIS — M7062 Trochanteric bursitis, left hip: Secondary | ICD-10-CM | POA: Diagnosis not present

## 2019-07-24 ENCOUNTER — Other Ambulatory Visit: Payer: Self-pay

## 2019-07-24 MED ORDER — OLMESARTAN MEDOXOMIL 40 MG PO TABS: 40.0000 mg | ORAL_TABLET | Freq: Every day | ORAL | 0 refills | Status: DC

## 2019-07-27 ENCOUNTER — Other Ambulatory Visit: Payer: Self-pay | Admitting: Dermatology

## 2019-07-28 DIAGNOSIS — Z170 Estrogen receptor positive status [ER+]: Secondary | ICD-10-CM | POA: Diagnosis not present

## 2019-07-28 DIAGNOSIS — C50212 Malignant neoplasm of upper-inner quadrant of left female breast: Secondary | ICD-10-CM | POA: Diagnosis not present

## 2019-08-26 ENCOUNTER — Other Ambulatory Visit: Payer: Self-pay | Admitting: Family Medicine

## 2019-08-26 ENCOUNTER — Other Ambulatory Visit: Payer: Self-pay | Admitting: Cardiovascular Disease

## 2019-09-28 ENCOUNTER — Encounter: Payer: Self-pay | Admitting: Cardiovascular Disease

## 2019-09-28 ENCOUNTER — Other Ambulatory Visit: Payer: Self-pay

## 2019-09-28 ENCOUNTER — Ambulatory Visit: Payer: PPO | Admitting: Cardiovascular Disease

## 2019-09-28 VITALS — BP 150/62 | HR 65 | Ht 65.5 in | Wt 143.2 lb

## 2019-09-28 DIAGNOSIS — E785 Hyperlipidemia, unspecified: Secondary | ICD-10-CM | POA: Diagnosis not present

## 2019-09-28 DIAGNOSIS — I10 Essential (primary) hypertension: Secondary | ICD-10-CM

## 2019-09-28 DIAGNOSIS — I493 Ventricular premature depolarization: Secondary | ICD-10-CM | POA: Diagnosis not present

## 2019-09-28 NOTE — Patient Instructions (Signed)
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
## 2019-10-02 ENCOUNTER — Other Ambulatory Visit: Payer: Self-pay

## 2019-10-02 ENCOUNTER — Ambulatory Visit: Payer: PPO

## 2019-10-02 DIAGNOSIS — E785 Hyperlipidemia, unspecified: Secondary | ICD-10-CM

## 2019-10-02 DIAGNOSIS — M85832 Other specified disorders of bone density and structure, left forearm: Secondary | ICD-10-CM

## 2019-10-02 DIAGNOSIS — I10 Essential (primary) hypertension: Secondary | ICD-10-CM

## 2019-10-02 NOTE — Patient Instructions (Addendum)
Dear Paige Medina,  Below is a summary of the goals we discussed during our follow up appointment on October 02, 2019. Please contact me anytime with questions or concerns.   Visit Information  Goals Addressed            This Visit's Progress   . Pharmacy Care Plan       CARE PLAN ENTRY  Current Barriers:  . Chronic Disease Management support, education, and care coordination needs related to Hypertension and Hyperlipidemia   Hypertension BP Readings from Last 3 Encounters:  09/28/19 (!) 150/62  04/25/19 140/66  03/21/19 (!) 120/58 .  Pharmacist Clinical Goal(s): o Over the next 6 months, patient will work with PharmD and providers to maintain BP goal <140/90 mmHg . Current regimen:   Olmesartan 40 mg - take 1 tablet daily   Amlodipine 5 mg - take 1 tablet daily  . Interventions: o Reviewed refill history and clinic blood pressure readings . Patient self care activities - Over the next 6 months, patient will: o Check blood pressure once weekly, document, and provide at future appointments o Ensure daily salt intake < 2300 mg/day  Hyperlipidemia Lab Results  Component Value Date/Time   LDLCALC 93 10/14/2018 08:03 AM   LDLCALC 50 03/15/2013 12:00 AM .  Pharmacist Clinical Goal(s): o Over the next 6 months, patient will work with PharmD and providers to maintain LDL goal < 100 . Current regimen:  o Lovastatin 40 mg - take one tablet daily at bedtime . Interventions: o Continue exercise and heart healthy diet o Update lipid panel at annual visit . Patient self care activities - Over the next 6 months, patient will: . Slowly increase exercise with goal of 30 minutes, 5 days per week . Incorporate a healthy diet high in vegetables, fruits and whole grains with low-fat dairy products, chicken, fish, legumes, non-tropical vegetable oils and nuts. Limit intake of sweets, sugar-sweetened beverages and red meats.  Osteopenia . Pharmacist Clinical Goal(s): o Over the next  6 months, patient will work with PharmD and providers to maintain adequate intake of vitamin D and calcium . Current regimen:   (Citracal) Calcium citrate-Vitamin D 400 mg/500 IU - 1 caplet daily  Vitamin D 5000 IU - 1 capsule every other day . Interventions: o Discussed patient limitation of calcium intake due to constipation o Recommend trying to obtain calcium primarily from dietary sources . Patient self care activities - Over the next 6 months, patient will: [name] May hold calcium supplement if able to achieve 1200 mg of calcium/day from dietary sources.   Please see past updates related to this goal by clicking on the "Past Updates" button in the selected goal        The patient verbalized understanding of instructions provided today and agreed to receive a mailed copy of patient instruction and/or educational materials.  Telephone follow up appointment with pharmacy team member scheduled for: 04/04/20 at 1 PM (telephone visit)  [name], PharmD Clinical Pharmacist [facility] [phone]   Calcium Content in Foods Calcium is the most abundant mineral in your body. Most of your body's calcium supply is stored in your bones and teeth. Calcium helps many parts of the body function normally, including:  Blood and blood vessels.  Nerves.  Hormones.  Muscles.  Bones and teeth. When your calcium stores are low, you may be at risk for low bone mass, bone loss, and broken bones (fractures). When you get enough calcium, it  helps to support strong bones and teeth throughout your life. Calcium is especially important for:  Children during growth spurts.  Girls during adolescence.  Women who are pregnant or breastfeeding.  Women after their menstrual cycle stops (postmenopause).  Women whose menstrual cycle has stopped due to anorexia nervosa or regular intense exercise.  People who cannot eat or digest dairy products.  Vegans. What are tips  for getting more calcium? General information  Try to get most of your calcium from food. Eat foods that are high in calcium.  Some people may benefit from taking calcium supplements. Check with your health care provider or diet and nutrition specialist (dietitian) before starting any calcium supplements. Calcium supplements may interact with certain medicines. Too much calcium may cause other health problems, like constipation and kidney stones.  For the body to absorb calcium, it needs vitamin D. Sources of vitamin D include: ? Skin exposure to direct sunlight. ? Foods, such as egg yolks, liver, saltwater fish, and fortified milk. ? Vitamin D supplements. Check with your health care provider or dietitian before starting any vitamin D supplements. What foods are high in calcium?  High-calcium foods are those that contain more than 100 milligrams (mg) of calcium per serving. Fruits  Fortified orange or other fruit juice, 300 mg per 8 oz serving. Vegetables  Collard greens, 360 mg per 8 oz serving.  Kale, 180 mg per 8 oz serving.  Bok choy, 160 mg per 8 oz serving. Grains  Fortified ready-to-eat cereals, 100-1,000 mg per 8 oz serving.  Fortified frozen waffles, 200 mg in two waffles. Meats and other proteins  Sardines, canned with bones, 325 mg per 3 oz serving.  Salmon, canned with bones, 180 mg per 3 oz serving.  Canned shrimp, 125 mg per 3 oz serving.  Baked beans, 160 mg per 4 oz serving. Dairy  Yogurt, plain, low-fat, 310 mg per 6 oz serving.  Milk, 300 mg per 8 oz serving.  American cheese, 195 mg per 1 oz serving.  Cheddar cheese, 205 mg per 1 oz serving.  Cottage cheese 2%, 105 mg per 4 oz serving.  Fortified soy, rice, or almond milk, 300 mg per 8 oz serving. The items listed above may not be a complete list of foods high in calcium. Actual amounts of calcium may be different depending on processing. Contact a dietitian for more information. What foods are  lower in calcium? Foods lower in calcium are those that contain 50 mg of calcium or less per serving. Fruits  Apple, about 6 mg in one apple.  Banana, about 12 mg in one banana. Vegetables  Lettuce, 19 mg per 2 oz serving.  Tomato, about 11 mg in one tomato. Grains  Rice, 4 mg per 6 oz serving.  Boiled potatoes, 14 mg per 8 oz serving.  White bread, 6 mg in one slice. Meats and other proteins  Egg, 27 mg per 2 oz serving.  Red meat, 7 mg per 4 oz serving.  Chicken, 17 mg per 4 oz serving.  Fish, cod or trout, 20 mg per 4 oz serving. The items listed above may not be a complete list of foods lower in calcium. Actual amounts of calcium may be different depending on processing. Contact a dietitian for more information. Summary  Calcium is an important mineral in the body because it affects many functions. Getting enough calcium helps support strong bones and teeth throughout your life.  Try to get most of your calcium from food.  Calcium supplements may interact with certain medicines. Check with your health care provider before starting any calcium supplements. This information is not intended to replace advice given to you by your health care provider. Make sure you discuss any questions you have with your health care provider. Document Revised: 02/09/2017 Document Reviewed: 02/09/2017 Elsevier Patient Education  2020 [employer].

## 2019-10-02 NOTE — Chronic Care Management (AMB) (Signed)
Chronic Care Management Pharmacy  Name: Paige Medina  MRN: [id number] DOB: 10/01/1933  Chief Complaint/ HPI  Paige Medina,  86 y.o. , female presents for their Follow-Up CCM visit with the clinical pharmacist in office.  PCP : [name], MD  Their chronic conditions include: HTN, HLD, DM, CKD, arthritis, osteopenia, chronic low back pain, breast cancer   Patient concerns: denies medication concerns, doing well  Last CCM visit 04/18/19 - no medication changes  Office Visits:   04/25/19: PCP visit - DM 6 month f/u, foot exam completed, doing well, rtc 88month  10/21/18: PCP visit, [facility] - no medication changes, recommend Shingrix  Consult Visit:  09/28/19: Cardiology - mildly elevated BP, but pt reports some hypotension at home, continue amlodipine and olmesartan, f/u 12 months  03/21/19: [facility] - HTN f/u much improved 120s/60s on amlodipine and olmesartan, no medication changes  Allergies  Allergen Reactions   Lidocaine Itching, Swelling and Other (See Comments)    Blisters, tetracaine okay for epidural injections   Lipitor [Atorvastatin] Other (See Comments)    myalgias   Latex Other (See Comments)    Blisters   Ciprofloxacin Nausea Only   Oxycodone Nausea Only and Other (See Comments)    constipation   Tape Other (See Comments)    Blisters--(Paper or cloth tape okay)   Tegaderm Ag Mesh [Silver] Rash   Tramadol Nausea Only and Other (See Comments)    constipation   Medications: Outpatient Encounter Medications as of 10/02/2019  Medication Sig Note   amLODipine (NORVASC) 5 MG tablet TAKE 1 TABLET(5 MG) BY MOUTH DAILY    beta carotene w/minerals (OCUVITE) tablet Take 1 tablet by mouth every other day.    Blood Glucose Monitoring Suppl (ONE TOUCH ULTRA 2) w/Device KIT Use as directed    Cholecalciferol 125 MCG (5000 UT) TABS Take 1 tablet by mouth every other day.    clobetasol (TEMOVATE) 0.05 % external solution APPLY A SMALL  AMOUNT TO AFFECTED AREA ONCE A DAY    clobetasol cream (TEMOVATE) 01.32% Apply 1 application topically 2 (two) times daily. 04/18/2019: PRN vaginal dermatitis   Coenzyme Q10 (CO Q-10) 200 MG CAPS Take 200 mg by mouth every morning.     docusate sodium (COLACE) 100 MG capsule Take 100 mg by mouth at bedtime. 04/18/2019: Taking twice daily   doxycycline (ADOXA) 50 MG tablet Take 50 mg by mouth daily as needed (for rosacea).     doxycycline (VIBRA-TABS) 100 MG tablet Take 1 tablet (100 mg total) by mouth daily.    GENTEAL SEVERE 0.3 % GEL ophthalmic ointment     glucose blood test strip Use as instructed (ultrablue 2 strips) E11.9    Ivermectin 1 % CREA Apply topically at bedtime.    Lancets (ONETOUCH ULTRASOFT) lancets Use as instructed E11.9    lovastatin (MEVACOR) 40 MG tablet TAKE 1 TABLET BY MOUTH EVERY DAY    metroNIDAZOLE (METROGEL) 0.75 % gel APPLY SMALL AMOUNT EXTERNALLY TO THE AFFECTED AREA 1 TO 2 TIMES DAILY AS DIRECTED    miconazole (MICOTIN) 2 % cream Apply 1 application topically daily.    naproxen sodium (ALEVE) 220 MG tablet Take 220 mg by mouth 2 (two) times daily as needed (for back pain).  04/18/2019: Usually takes PRN once weekly   olmesartan (BENICAR) 40 MG tablet Take 1 tablet (40 mg total) by mouth daily. APPOINTMENT NEEDED FOR FURTHER REFILLS.    Probiotic Product (PROBIOTIC DAILY PO) Take 1 capsule by mouth  at bedtime.     triamcinolone cream (KENALOG) 0.1 % Apply 1 application topically 2 (two) times daily as needed. 04/18/2019: PRN eczema on arms   Turmeric 500 MG CAPS Take 500 mg by mouth every other day.     No facility-administered encounter medications on file as of 10/02/2019.    Current Diagnosis/Assessment: Goals     Increase physical activity     Starting 10/12/2017, I will continue exercising for at least 60 min 3 days per week.      Pharmacy Care Plan     CARE PLAN ENTRY  Current Barriers:   Chronic Disease Management support, education,  and care coordination needs related to Hypertension and Hyperlipidemia   Hypertension BP Readings from Last 3 Encounters:  09/28/19 (!) 150/62  04/25/19 140/66  03/21/19 (!) 120/58   Pharmacist Clinical Goal(s): o Over the next 6 months, patient will work with PharmD and providers to maintain BP goal <140/90 mmHg  Current regimen:   Olmesartan 40 mg - take 1 tablet daily   Amlodipine 5 mg - take 1 tablet daily   Interventions: o Reviewed refill history and clinic blood pressure readings  Patient self care activities - Over the next 6 months, patient will: o Check blood pressure once weekly, document, and provide at future appointments o Ensure daily salt intake < 2300 mg/day  Hyperlipidemia Lab Results  Component Value Date/Time   LDLCALC 93 10/14/2018 08:03 AM   LDLCALC 50 03/15/2013 12:00 AM   Pharmacist Clinical Goal(s): o Over the next 6 months, patient will work with PharmD and providers to maintain LDL goal < 100  Current regimen:  o Lovastatin 40 mg - take one tablet daily at bedtime  Interventions: o Continue exercise and heart healthy diet o Update lipid panel at annual visit  Patient self care activities - Over the next 6 months, patient will:  Slowly increase exercise with goal of 30 minutes, 5 days per week  Incorporate a healthy diet high in vegetables, fruits and whole grains with low-fat dairy products, chicken, fish, legumes, non-tropical vegetable oils and nuts. Limit intake of sweets, sugar-sweetened beverages and red meats.  Osteopenia  Pharmacist Clinical Goal(s): o Over the next 6 months, patient will work with PharmD and providers to maintain adequate intake of vitamin D and calcium  Current regimen:   (Citracal) Calcium citrate-Vitamin D 400 mg/500 IU - 1 caplet daily  Vitamin D 5000 IU - 1 capsule every other day  Interventions: o Discussed patient limitation of calcium intake due to constipation o Recommend trying to obtain calcium  primarily from dietary sources  Patient self care activities - Over the next 6 months, patient will:  May hold calcium supplement if able to achieve 1200 mg of calcium/day from dietary sources.   Please see past updates related to this goal by clicking on the "Past Updates" button in the selected goal       Hyperlipidemia   Lipid Panel     Component Value Date/Time   CHOL 170 10/14/2018 0803   TRIG 83.0 10/14/2018 0803   TRIG 243 03/15/2013 0000   HDL 61.20 10/14/2018 0803   CHOLHDL 3 10/14/2018 0803   VLDL 16.6 10/14/2018 0803   LDLCALC 93 10/14/2018 0803   LDLCALC 50 03/15/2013 0000   LDL goal < 100 Patient has failed these meds in past: none reported Patient is currently controlled on the following medications:   Lovastatin 40 mg - take one tablet daily at bedtime  We discussed:  Pt d/c krill oil per discussion last visit. She continues to consume fish often, 1-2 days per week. Lipid panel due at [facility] this month.   Plan: Continue current medications  Hypertension   Office blood pressures are  BP Readings from Last 3 Encounters:  09/28/19 (!) 150/62  04/25/19 140/66  03/21/19 (!) 120/58   CMP Latest Ref Rng & Units 04/26/2019 10/14/2018 10/19/2017  Glucose 70 - 99 mg/dL 114(H) 109(H) 115(H)  BUN 6 - 23 mg/dL 25(H) 22 26(H)  Creatinine 0.40 - 1.20 mg/dL 1.15 1.08 1.21(H)  Sodium 135 - 145 mEq/L 138 140 140  Potassium 3.5 - 5.1 mEq/L 4.9 4.8 4.5  Chloride 96 - 112 mEq/L 103 104 101  CO2 19 - 32 mEq/L 28 30 28   Calcium 8.4 - 10.5 mg/dL 9.8 9.5 10.3  Total Protein 6.0 - 8.3 g/dL - 7.2 -  Total Bilirubin 0.2 - 1.2 mg/dL - 0.5 -  Alkaline Phos 39 - 117 U/L - 74 -  AST 0 - 37 U/L - 17 -  ALT 0 - 35 U/L - 15 -   Patient has failed these meds in the past: no beta blocker due to bradycardia, losartan (hypotension, fluctuations), Benicar (cost) Patient checks BP at home when feeling symptomatic Patient home BP readings are ranging: 120s/60s  BP goal < 140/90  mmHg Patient is currently controlled on the following medications:   Olmesartan 40 mg - take 1 tablet daily (AM)  Amlodipine 5 mg - take 1 tablet daily (AM)  We discussed: denies adverse effects, refills timely for amlodipine, > 5 day gap with olmesartan Exercise: reports walking 25 minutes on treadmill 3 days per week  Plan: Continue current medications; Recommend home monitoring blood pressure weekly.      Osteopenia   DEXA (02/10/18): T-score (forearm) -2.2  FRAX: The probability of a major osteoporotic fracture is 13.1% within the next ten years. The probability of a hip fracture is 3.3% within the next ten years.  Vitamin D (10/05/16): 36  Patient has failed these meds in past:  none Patient is currently controlled on the following medications:  (Citracal) Calcium citrate-Vitamin D 400 mg/500 IU - 1 caplet daily  Vitamin D 5000 IU - 1 capsule every other day  We discussed: Pt is only able to tolerate 1 citracal/day due to constipation; The total calcium intake per day should include both dietary and supplemental calcium (1200 mg/day)  Plan: Continue current medications; Recommend getting calcium primarily from diet.   Medication Management  Misc: clobetasol 0.05% cream (PRN vaginal dermatitis), doxycycline 100 mg (1 tablet daily, hold every third day for rosacea), triamcinolone cream 0.1% (PRN eczema)  No longer taking: estradiol 0.84m/gm vaginal cream, miconazole 2% cream  OTCs: Ocuvite QOD (dry eyes), CoQ10 200 mg QD (heart), docusate 100 mg BID, probiotic daily  Pharmacy: [facility], [address]   Adherence: uses pillbox  Affordability: denies concerns  CCM Follow Up: 6 months, telephone  MDebbora Dus PharmD Clinical Pharmacist [phone]

## 2019-10-03 NOTE — Progress Notes (Signed)
I have collaborated with the care management provider regarding care management and care coordination activities outlined in this encounter and have reviewed this encounter including documentation in the note and care plan. I am certifying that I agree with the content of this note and encounter as supervising physician.  

## 2019-10-09 ENCOUNTER — Other Ambulatory Visit: Payer: Self-pay | Admitting: Family Medicine

## 2019-10-09 DIAGNOSIS — Z1231 Encounter for screening mammogram for malignant neoplasm of breast: Secondary | ICD-10-CM

## 2019-10-18 ENCOUNTER — Other Ambulatory Visit: Payer: Self-pay | Admitting: Cardiovascular Disease

## 2019-10-22 ENCOUNTER — Other Ambulatory Visit: Payer: Self-pay | Admitting: Family Medicine

## 2019-10-22 DIAGNOSIS — N183 Chronic kidney disease, stage 3 unspecified: Secondary | ICD-10-CM

## 2019-10-22 DIAGNOSIS — E785 Hyperlipidemia, unspecified: Secondary | ICD-10-CM

## 2019-10-22 DIAGNOSIS — E119 Type 2 diabetes mellitus without complications: Secondary | ICD-10-CM

## 2019-10-25 ENCOUNTER — Other Ambulatory Visit: Payer: Self-pay

## 2019-10-25 ENCOUNTER — Other Ambulatory Visit (INDEPENDENT_AMBULATORY_CARE_PROVIDER_SITE_OTHER): Payer: PPO

## 2019-10-25 DIAGNOSIS — E1122 Type 2 diabetes mellitus with diabetic chronic kidney disease: Secondary | ICD-10-CM

## 2019-10-25 DIAGNOSIS — E785 Hyperlipidemia, unspecified: Secondary | ICD-10-CM

## 2019-10-25 DIAGNOSIS — E119 Type 2 diabetes mellitus without complications: Secondary | ICD-10-CM | POA: Diagnosis not present

## 2019-10-25 DIAGNOSIS — N183 Chronic kidney disease, stage 3 unspecified: Secondary | ICD-10-CM

## 2019-10-25 LAB — CBC WITH DIFFERENTIAL/PLATELET
Basophils Absolute: 0.1 10*3/uL (ref 0.0–0.1)
Basophils Relative: 1.9 % (ref 0.0–3.0)
Eosinophils Absolute: 0.3 10*3/uL (ref 0.0–0.7)
Eosinophils Relative: 4.2 % (ref 0.0–5.0)
HCT: 42 % (ref 36.0–46.0)
Hemoglobin: 13.9 g/dL (ref 12.0–15.0)
Lymphocytes Relative: 36 % (ref 12.0–46.0)
Lymphs Abs: 2.5 10*3/uL (ref 0.7–4.0)
MCHC: 33.2 g/dL (ref 30.0–36.0)
MCV: 94.6 fl (ref 78.0–100.0)
Monocytes Absolute: 0.5 10*3/uL (ref 0.1–1.0)
Monocytes Relative: 7.6 % (ref 3.0–12.0)
Neutro Abs: 3.4 10*3/uL (ref 1.4–7.7)
Neutrophils Relative %: 50.3 % (ref 43.0–77.0)
Platelets: 236 10*3/uL (ref 150.0–400.0)
RBC: 4.43 Mil/uL (ref 3.87–5.11)
RDW: 13.9 % (ref 11.5–15.5)
WBC: 6.8 10*3/uL (ref 4.0–10.5)

## 2019-10-25 LAB — LIPID PANEL
Cholesterol: 193 mg/dL (ref 0–200)
HDL: 53 mg/dL (ref >39.00)
LDL Cholesterol: 108 mg/dL — ABNORMAL HIGH (ref 0–99)
NonHDL: 139.74
Total CHOL/HDL Ratio: 4
Triglycerides: 161 mg/dL — ABNORMAL HIGH (ref 0.0–149.0)
VLDL: 32.2 mg/dL (ref 0.0–40.0)

## 2019-10-25 LAB — COMPREHENSIVE METABOLIC PANEL
ALT: 18 U/L (ref 0–35)
AST: 16 U/L (ref 0–37)
Albumin: 4.2 g/dL (ref 3.5–5.2)
Alkaline Phosphatase: 75 U/L (ref 39–117)
BUN: 19 mg/dL (ref 6–23)
CO2: 31 mEq/L (ref 19–32)
Calcium: 10 mg/dL (ref 8.4–10.5)
Chloride: 105 mEq/L (ref 96–112)
Creatinine, Ser: 1.11 mg/dL (ref 0.40–1.20)
GFR: 46.77 mL/min — ABNORMAL LOW (ref >60.00)
Glucose, Bld: 130 mg/dL — ABNORMAL HIGH (ref 70–99)
Potassium: 4.7 mEq/L (ref 3.5–5.1)
Sodium: 143 mEq/L (ref 135–145)
Total Bilirubin: 0.6 mg/dL (ref 0.2–1.2)
Total Protein: 7 g/dL (ref 6.0–8.3)

## 2019-10-25 LAB — HEMOGLOBIN A1C: Hgb A1c MFr Bld: 6.4 % (ref 4.6–6.5)

## 2019-10-25 LAB — VITAMIN D 25 HYDROXY (VIT D DEFICIENCY, FRACTURES): VITD: 35.44 ng/mL (ref 30.00–100.00)

## 2019-10-26 LAB — PARATHYROID HORMONE, INTACT (NO CA): PTH: 77 pg/mL — ABNORMAL HIGH (ref 14–64)

## 2019-11-01 ENCOUNTER — Ambulatory Visit (INDEPENDENT_AMBULATORY_CARE_PROVIDER_SITE_OTHER): Payer: PPO | Admitting: Family Medicine

## 2019-11-01 ENCOUNTER — Other Ambulatory Visit: Payer: Self-pay

## 2019-11-01 ENCOUNTER — Encounter: Payer: Self-pay | Admitting: Family Medicine

## 2019-11-01 VITALS — BP 126/70 | HR 67 | Temp 98.0°F | Ht 66.34 in | Wt 147.5 lb

## 2019-11-01 DIAGNOSIS — I10 Essential (primary) hypertension: Secondary | ICD-10-CM

## 2019-11-01 DIAGNOSIS — M159 Polyosteoarthritis, unspecified: Secondary | ICD-10-CM

## 2019-11-01 DIAGNOSIS — E785 Hyperlipidemia, unspecified: Secondary | ICD-10-CM

## 2019-11-01 DIAGNOSIS — Z0000 Encounter for general adult medical examination without abnormal findings: Secondary | ICD-10-CM

## 2019-11-01 DIAGNOSIS — N183 Chronic kidney disease, stage 3 unspecified: Secondary | ICD-10-CM

## 2019-11-01 DIAGNOSIS — N289 Disorder of kidney and ureter, unspecified: Secondary | ICD-10-CM

## 2019-11-01 DIAGNOSIS — Z853 Personal history of malignant neoplasm of breast: Secondary | ICD-10-CM

## 2019-11-01 DIAGNOSIS — L719 Rosacea, unspecified: Secondary | ICD-10-CM

## 2019-11-01 DIAGNOSIS — E119 Type 2 diabetes mellitus without complications: Secondary | ICD-10-CM

## 2019-11-01 IMAGING — MG MM DIGITAL SCREENING BILAT W/ TOMO W/ CAD
6 of 10 series · 6 of 26 positions shown · non-contrast
Comparison: Previous exam(s).

CLINICAL DATA: Screening.

EXAM:
DIGITAL SCREENING BILATERAL MAMMOGRAM WITH TOMO AND CAD

[R CC synth-2D]
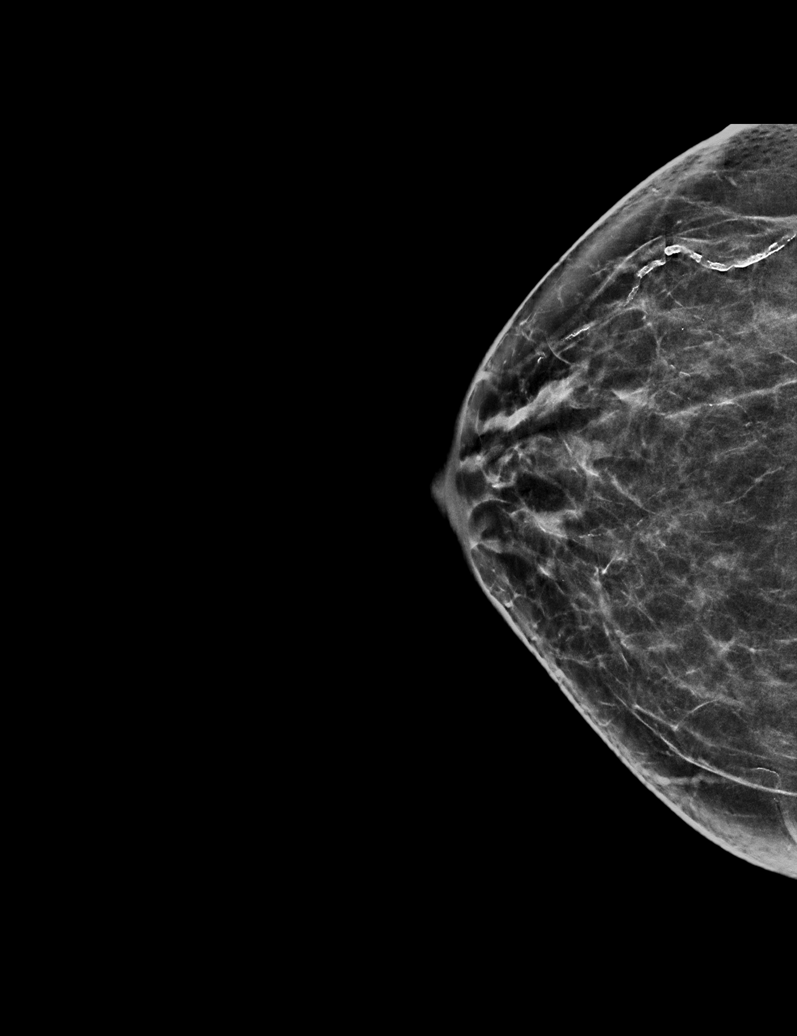

[L CC synth-2D]
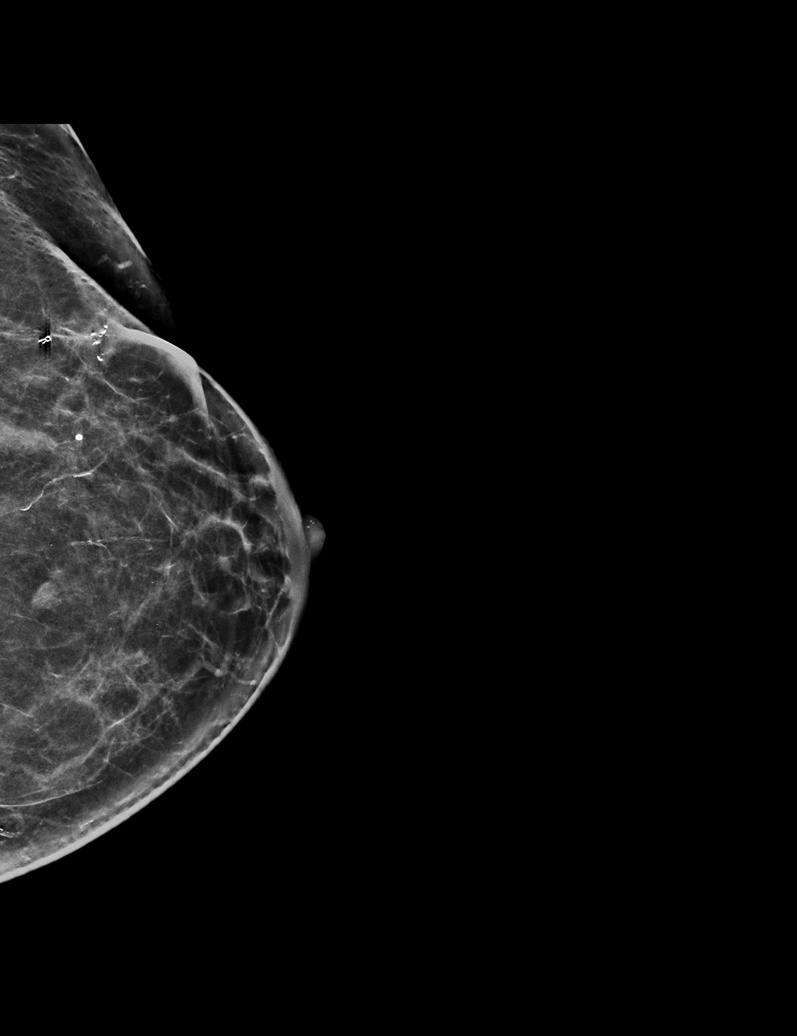

[R MLO synth-2D]
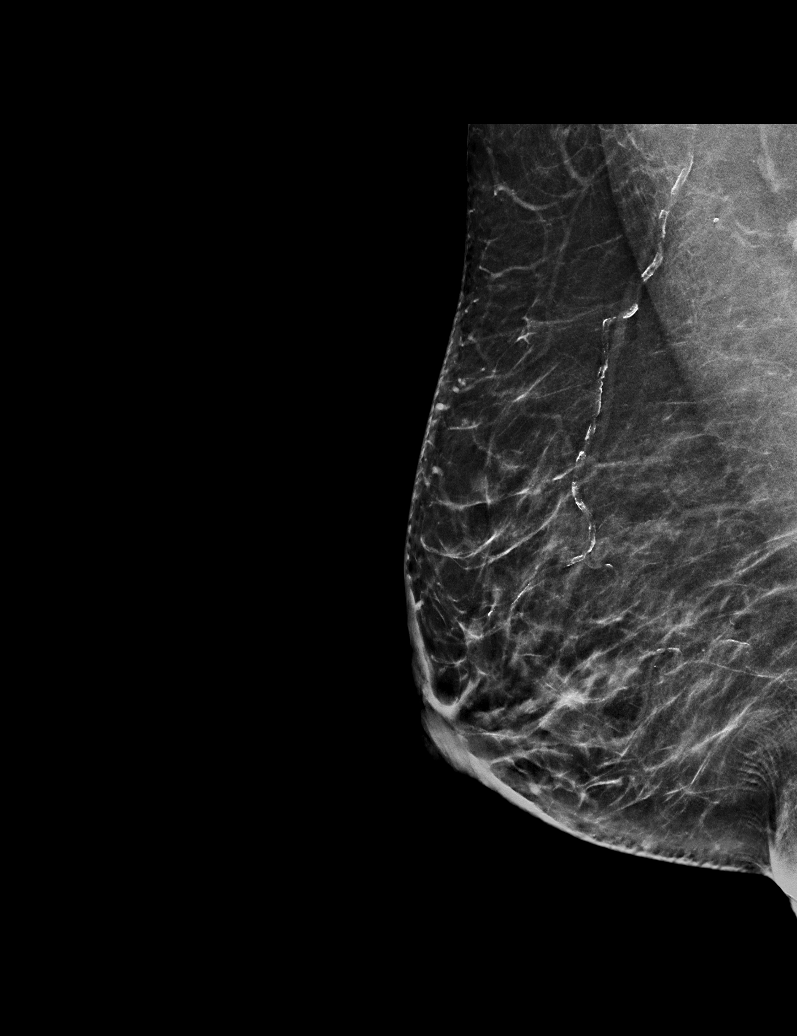

[L MLO synth-2D]
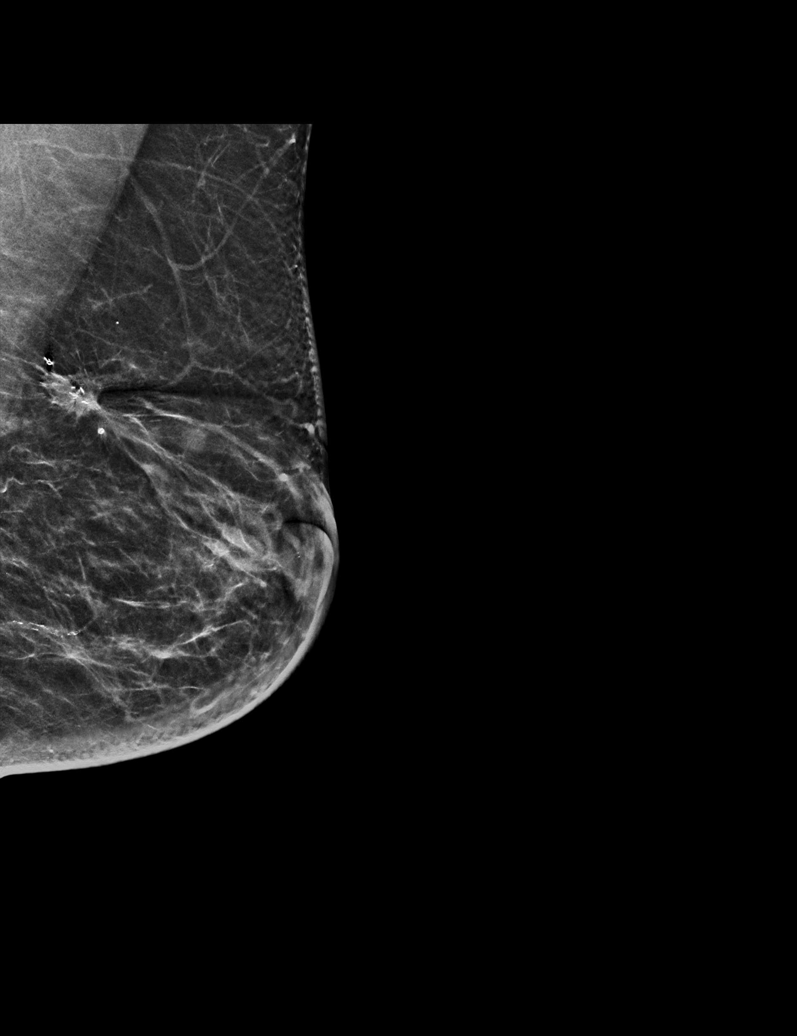

[L CC]
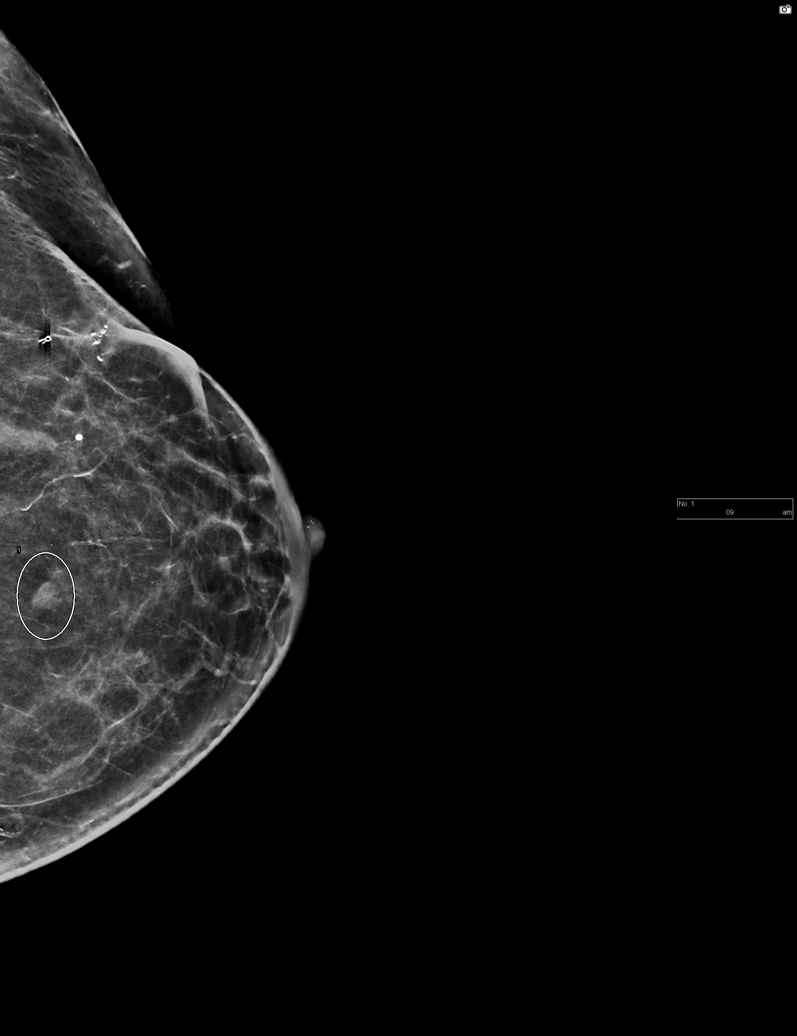

[L MLO]
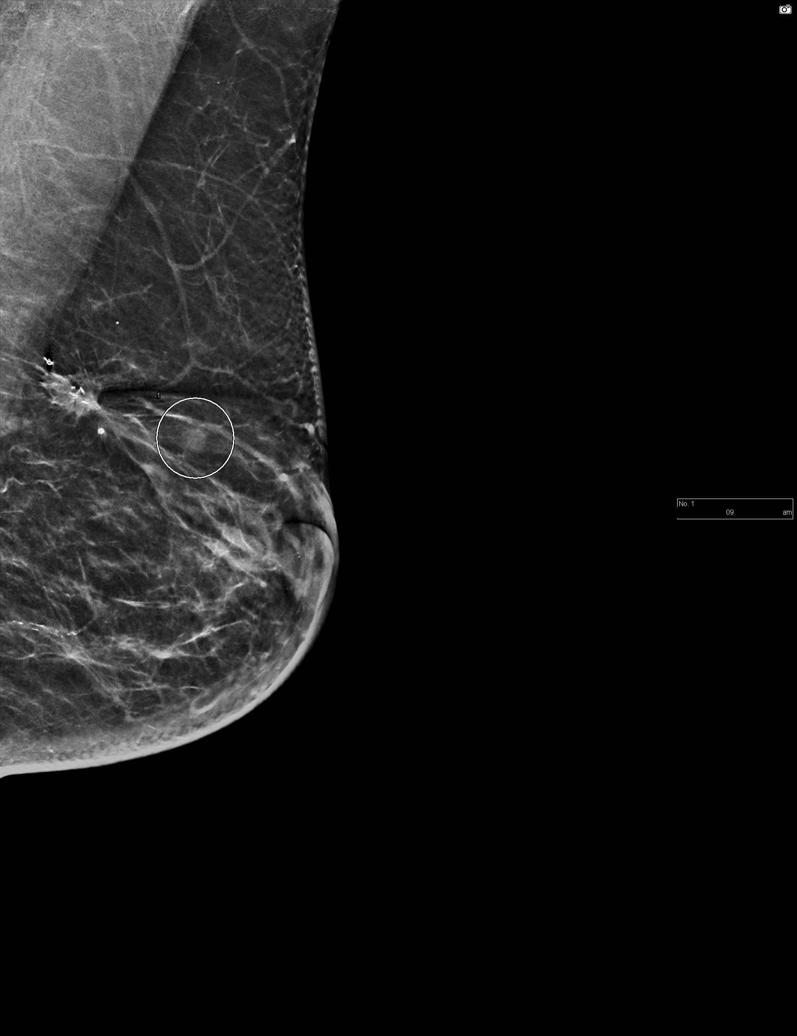

[6 of 26 positions shown; findings below may reference images not displayed]

ACR Breast Density Category b: There are scattered areas of
fibroglandular density.
FINDINGS: In the left breast, a possible mass warrants further evaluation. In
the right breast, no findings suspicious for malignancy.

Images were processed with CAD.
IMPRESSION: Further evaluation is suggested for possible mass in the left
breast.

RECOMMENDATION:
Diagnostic mammogram and possibly ultrasound of the left breast.
(Code:[id number])

The patient will be contacted regarding the findings, and additional
imaging will be scheduled.

BI-RADS CATEGORY  0: Incomplete. Need additional imaging evaluation
and/or prior mammograms for comparison.

## 2019-11-01 MED ORDER — TURMERIC 500 MG PO CAPS
500.0000 mg | ORAL_CAPSULE | Freq: Every day | ORAL | Status: DC
Start: 2019-11-01 — End: 2020-01-18

## 2019-11-01 NOTE — Progress Notes (Signed)
Hearing test not done. Patient wearing hearting aids in bilateral ears.

## 2019-11-01 NOTE — Assessment & Plan Note (Signed)
Continue daily turmeric

## 2019-11-01 NOTE — Progress Notes (Signed)
This visit was conducted in person.  BP 126/70   Pulse 67   Temp 98 F (36.7 C) (Temporal)   Ht 5' 6.34" (1.685 m)   Wt 147 lb 8 oz (66.9 kg)   SpO2 98%   BMI 23.56 kg/m    CC: CPE  Subjective:    Patient ID: Paige Medina, female    DOB: 1934/01/19, 86 y.o.   MRN: [id number]  HPI: Paige Medina is a 86 y.o. female presenting on 11/01/2019 for Annual Exam   Did not see health advisor this year.    Hearing Screening   125Hz  250Hz  500Hz  1000Hz  2000Hz  3000Hz  4000Hz  6000Hz  8000Hz   Right ear:           Left ear:           Comments: Patient wears hearing aids in both ears   Visual Acuity Screening   Right eye Left eye Both eyes  Without correction:     With correction: 20/25 20/20 20/20       Office Visit from 11/01/2019 in [place] at [place]  PHQ-2 Total Score 0      Fall Risk  11/01/2019 10/21/2018 10/12/2017 10/07/2016 09/27/2015  Falls in the past year? 0 0 Yes Yes No  Comment - - fall occurred while walking up stairs; denies injury pt reports tripping over a cord which resulted in a fall -  Number falls in past yr: 0 - 1 1 -  Comment - - - - -  Injury with Fall? 0 - No No -  Follow up Falls evaluation completed - - - -    Bosniak 2 L kidney lesion - unchanged over a year consistent with hemorrhagic/proteinaceous cyst.   Preventative: COLONOSCOPY10/2016 mod diverticulosis o/w WNL, f/u prn [name]) Well woman - s/p hysterectomy 1977, ovaries remain Breast cancer - see above. R mammo WNL 11/2018. Rpt scheduled for next month.  Dexa - 08/2012 - WNL Flu yearly Pneumovax 06/2005. prevnar 2015 Td 06/2005  COVID vaccine - Moderna 03/2019, 04/2019 zostavax 04/2005  shingrix - discussed.  Advanced directives:Scanned into chart8/2016. [name] husband then Ray Church and Wille Glaser are [facility]. Does not want prolonged life support if terminal or irreversible condition.  Seat belt use discussed. Sunscreen use discussed. No changing moles on skin.  Non smoker   Alcohol -once a month  Dentist Q6 mo  Eye exam - yearly  Bowel - chronic constipation managed with stool softener and other bowel regimen Bladder - no incontinence  Lives with husband, no pets  Grown children  Occupation: retired, varied jobs Veterinary surgeon  Activity: gym 3d/wk, active mentally with crosswords  Diet: good water, fruits/vegetables daily     Relevant past medical, surgical, family and social history reviewed and updated as indicated. Interim medical history since our last visit reviewed. Allergies and medications reviewed and updated. Outpatient Medications Prior to Visit  Medication Sig Dispense Refill  . amLODipine (NORVASC) 5 MG tablet TAKE 1 TABLET(5 MG) BY MOUTH DAILY 90 tablet 0  . beta carotene w/minerals (OCUVITE) tablet Take 1 tablet by mouth every other day.    . Blood Glucose Monitoring Suppl (ONE TOUCH ULTRA 2) w/Device KIT Use as directed 1 each 0  . Cholecalciferol 125 MCG (5000 UT) TABS Take 1 tablet by mouth every other day.    . clobetasol (TEMOVATE) 0.05 % external solution APPLY A SMALL AMOUNT TO AFFECTED AREA ONCE A DAY 50 mL 0  . clobetasol cream (TEMOVATE) 4.09 % Apply 1 application topically  2 (two) times daily. 30 g 1  . Coenzyme Q10 (CO Q-10) 200 MG CAPS Take 200 mg by mouth every morning.     . docusate sodium (COLACE) 100 MG capsule Take 100 mg by mouth at bedtime.    [name] doxycycline (VIBRA-TABS) 100 MG tablet Take 1 tablet (100 mg total) by mouth daily. (Patient taking differently: Take 100 mg by mouth daily. Takes 2 days on, 1 day off for acne) 90 tablet 3  . GENTEAL SEVERE 0.3 % GEL ophthalmic ointment     . glucose blood test strip Use as instructed (ultrablue 2 strips) E11.9 100 each 3  . Ivermectin 1 % CREA Apply topically at bedtime.    . Lancets (ONETOUCH ULTRASOFT) lancets Use as instructed E11.9 100 each 3  . lovastatin (MEVACOR) 40 MG tablet TAKE 1 TABLET BY MOUTH EVERY DAY 90 tablet 0  . metroNIDAZOLE (METROGEL) 0.75 % gel APPLY  SMALL AMOUNT EXTERNALLY TO THE AFFECTED AREA 1 TO 2 TIMES DAILY AS DIRECTED    . miconazole (MICOTIN) 2 % cream Apply 1 application topically daily. 28.35 g 3  . naproxen sodium (ALEVE) 220 MG tablet Take 220 mg by mouth 2 (two) times daily as needed (for back pain).     [name] olmesartan (BENICAR) 40 MG tablet TAKE 1 TABLET BY MOUTH ONCE DAILY(NEED APPT FOR MORE REFILLS) 90 tablet 3  . Probiotic Product (PROBIOTIC DAILY PO) Take 1 capsule by mouth at bedtime.     . triamcinolone cream (KENALOG) 0.1 % Apply 1 application topically 2 (two) times daily as needed. 30 g 3  . doxycycline (ADOXA) 50 MG tablet Take 50 mg by mouth daily as needed (for rosacea).     . Turmeric 500 MG CAPS Take 500 mg by mouth every other day.      No facility-administered medications prior to visit.     Per HPI unless specifically indicated in ROS section below Review of Systems  Constitutional: Negative for activity change, appetite change, chills, fatigue, fever and unexpected weight change.  HENT: Negative for hearing loss.   Eyes: Negative for visual disturbance.  Respiratory: Negative for cough, chest tightness, shortness of breath and wheezing.   Cardiovascular: Negative for chest pain, palpitations and leg swelling.  Gastrointestinal: Negative for abdominal distention, abdominal pain, blood in stool, constipation, diarrhea, nausea and vomiting.  Genitourinary: Negative for difficulty urinating and hematuria.  Musculoskeletal: Negative for arthralgias, myalgias and neck pain.  Skin: Negative for rash.  Neurological: Negative for dizziness, seizures, syncope and headaches.  Hematological: Negative for adenopathy. Does not bruise/bleed easily.  Psychiatric/Behavioral: Negative for dysphoric mood. The patient is not nervous/anxious.    Objective:  BP 126/70   Pulse 67   Temp 98 F (36.7 C) (Temporal)   Ht 5' 6.34" (1.685 m)   Wt 147 lb 8 oz (66.9 kg)   SpO2 98%   BMI 23.56 kg/m   Wt Readings from Last 3  Encounters:  11/01/19 147 lb 8 oz (66.9 kg)  09/28/19 143 lb 4 oz (65 kg)  04/25/19 144 lb 1 oz (65.3 kg)      Physical Exam Vitals and nursing note reviewed.  Constitutional:      General: She is not in acute distress.    Appearance: Normal appearance. She is well-developed. She is not ill-appearing.  HENT:     Head: Normocephalic and atraumatic.     Right Ear: Hearing, tympanic membrane, ear canal and external ear normal.     Left Ear: Hearing,  tympanic membrane, ear canal and external ear normal.  Eyes:     General: No scleral icterus.    Extraocular Movements: Extraocular movements intact.     Conjunctiva/sclera: Conjunctivae normal.     Pupils: Pupils are equal, round, and reactive to light.  Neck:     Thyroid: No thyroid mass, thyromegaly or thyroid tenderness.     Vascular: No carotid bruit.  Cardiovascular:     Rate and Rhythm: Normal rate and regular rhythm.     Pulses: Normal pulses.          Radial pulses are 2+ on the right side and 2+ on the left side.     Heart sounds: Normal heart sounds. No murmur heard.   Pulmonary:     Effort: Pulmonary effort is normal. No respiratory distress.     Breath sounds: Normal breath sounds. No wheezing, rhonchi or rales.  Abdominal:     General: Abdomen is flat. Bowel sounds are normal. There is no distension.     Palpations: Abdomen is soft. There is no mass.     Tenderness: There is no abdominal tenderness. There is no guarding or rebound.     Hernia: No hernia is present.  Musculoskeletal:        General: Normal range of motion.     Cervical back: Normal range of motion and neck supple.     Right lower leg: No edema.     Left lower leg: No edema.  Lymphadenopathy:     Cervical: No cervical adenopathy.  Skin:    General: Skin is warm and dry.     Findings: No rash.  Neurological:     General: No focal deficit present.     Mental Status: She is alert and oriented to person, place, and time.     Comments:  CN grossly  intact, station and gait intact Recall 3/3 Calculation 5/5 serial 7s  Psychiatric:        Mood and Affect: Mood normal.        Behavior: Behavior normal.        Thought Content: Thought content normal.        Judgment: Judgment normal.       Results for orders placed or performed in visit on 10/25/19  Parathyroid hormone, intact (no Ca)  Result Value Ref Range   PTH 77 (H) 14 - 64 pg/mL  CBC with Differential/Platelet  Result Value Ref Range   WBC 6.8 4.0 - 10.5 K/uL   RBC 4.43 3.87 - 5.11 Mil/uL   Hemoglobin 13.9 12.0 - 15.0 g/dL   HCT 42.0 36 - 46 %   MCV 94.6 78.0 - 100.0 fl   MCHC 33.2 30.0 - 36.0 g/dL   RDW 13.9 11.5 - 15.5 %   Platelets 236.0 150 - 400 K/uL   Neutrophils Relative % 50.3 43 - 77 %   Lymphocytes Relative 36.0 12 - 46 %   Monocytes Relative 7.6 3 - 12 %   Eosinophils Relative 4.2 0 - 5 %   Basophils Relative 1.9 0 - 3 %   Neutro Abs 3.4 1.4 - 7.7 K/uL   Lymphs Abs 2.5 0.7 - 4.0 K/uL   Monocytes Absolute 0.5 0 - 1 K/uL   Eosinophils Absolute 0.3 0 - 0 K/uL   Basophils Absolute 0.1 0 - 0 K/uL  Hemoglobin A1c  Result Value Ref Range   Hgb A1c MFr Bld 6.4 4.6 - 6.5 %  Comprehensive metabolic panel  Result Value  Ref Range   Sodium 143 135 - 145 mEq/L   Potassium 4.7 3.5 - 5.1 mEq/L   Chloride 105 96 - 112 mEq/L   CO2 31 19 - 32 mEq/L   Glucose, Bld 130 (H) 70 - 99 mg/dL   BUN 19 6 - 23 mg/dL   Creatinine, Ser 1.11 0.40 - 1.20 mg/dL   Total Bilirubin 0.6 0.2 - 1.2 mg/dL   Alkaline Phosphatase 75 39 - 117 U/L   AST 16 0 - 37 U/L   ALT 18 0 - 35 U/L   Total Protein 7.0 6.0 - 8.3 g/dL   Albumin 4.2 3.5 - 5.2 g/dL   GFR 46.77 (L) >60.00 mL/min   Calcium 10.0 8.4 - 10.5 mg/dL  Lipid panel  Result Value Ref Range   Cholesterol 193 0 - 200 mg/dL   Triglycerides 161.0 (H) 0 - 149 mg/dL   HDL 53.00 >39.00 mg/dL   VLDL 32.2 0.0 - 40.0 mg/dL   LDL Cholesterol 108 (H) 0 - 99 mg/dL   Total CHOL/HDL Ratio 4    NonHDL 139.74   VITAMIN D 25 Hydroxy (Vit-D  Deficiency, Fractures)  Result Value Ref Range   VITD 35.44 30.00 - 100.00 ng/mL   Assessment & Plan:  This visit occurred during the SARS-CoV-2 public health emergency.  Safety protocols were in place, including screening questions prior to the visit, additional usage of staff PPE, and extensive cleaning of exam room while observing appropriate contact time as indicated for disinfecting solutions.   Problem List Items Addressed This Visit    Rosacea    Followed by derm.       Osteoarthritis    Continue daily turmeric      Medicare annual wellness visit, subsequent - Primary    I have personally reviewed the Medicare Annual Wellness questionnaire and have noted 1. The patient's medical and social history 2. Their use of alcohol, tobacco or illicit drugs 3. Their current medications and supplements 4. The patient's functional ability including ADL's, fall risks, home safety risks and hearing or visual impairment. Cognitive function has been assessed and addressed as indicated.  5. Diet and physical activity 6. Evidence for depression or mood disorders The patients weight, height, BMI have been recorded in the chart. I have made referrals, counseling and provided education to the patient based on review of the above and I have provided the pt with a written personalized care plan for preventive services. Provider list updated.. See scanned questionairre as needed for further documentation. Reviewed preventative protocols and updated unless pt declined.       Lesion of left native kidney    Reviewed recent reassuring MRI       Hypertension    Chronic, stable on current regimen.       Hyperlipidemia associated with type 2 diabetes mellitus (HCC)    Chronic, stable. Continue current regimen of lovastatin 36m daily.  The ASCVD Risk score (Mikey BussingDC Jr., et al., 2013) failed to calculate for the following reasons:   The 2013 ASCVD risk score is only valid for ages 461to 762       History of breast cancer    Upcoming unilateral mammogram.       Health maintenance examination    Preventative protocols reviewed and updated unless pt declined. Discussed healthy diet and lifestyle.       Diabetes mellitus without complication ([place]    Stable period - diet controlled.       CKD  stage 3 due to type 2 diabetes mellitus (HCC)    Chronic, stable. Continue good hydration status.           Meds ordered this encounter  Medications  . Turmeric 500 MG CAPS    Sig: Take 500 mg by mouth daily.   No orders of the defined types were placed in this encounter.   Patient instructions: If interested, check with pharmacy about new 2 shot shingles series (shingrix).  You are doing well today Return as needed or in 6 months for follow up visit.   Follow up plan: Return in about 6 months (around 04/30/2020) for follow up visit.  [name], MD

## 2019-11-01 NOTE — Assessment & Plan Note (Addendum)
Upcoming unilateral mammogram.

## 2019-11-01 NOTE — Assessment & Plan Note (Signed)
Preventative protocols reviewed and updated unless pt declined. Discussed healthy diet and lifestyle.  

## 2019-11-01 NOTE — Assessment & Plan Note (Signed)
Followed by derm. 

## 2019-11-01 NOTE — Assessment & Plan Note (Signed)
Reviewed recent reassuring MRI

## 2019-11-01 NOTE — Assessment & Plan Note (Signed)
Stable period - diet controlled.

## 2019-11-01 NOTE — Assessment & Plan Note (Signed)
Chronic, stable on current regimen.  

## 2019-11-01 NOTE — Assessment & Plan Note (Signed)
Chronic, stable. Continue current regimen of lovastatin 40mg  daily.  The ASCVD Risk score Mikey Bussing DC Jr., et al., 2013) failed to calculate for the following reasons:   The 2013 ASCVD risk score is only valid for ages 31 to 61

## 2019-11-01 NOTE — Patient Instructions (Addendum)
If interested, check with pharmacy about new 2 shot shingles series (shingrix).  You are doing well today Return as needed or in 6 months for follow up visit.   Health Maintenance After Age 84 After age 34, you are at a higher risk for certain long-term diseases and infections as well as injuries from falls. Falls are a major cause of broken bones and head injuries in people who are older than age 30. Getting regular preventive care can help to keep you healthy and well. Preventive care includes getting regular testing and making lifestyle changes as recommended by your health care provider. Talk with your health care provider about:  Which screenings and tests you should have. A screening is a test that checks for a disease when you have no symptoms.  A diet and exercise plan that is right for you. What should I know about screenings and tests to prevent falls? Screening and testing are the best ways to find a health problem early. Early diagnosis and treatment give you the best chance of managing medical conditions that are common after age 50. Certain conditions and lifestyle choices may make you more likely to have a fall. Your health care provider may recommend:  Regular vision checks. Poor vision and conditions such as cataracts can make you more likely to have a fall. If you wear glasses, make sure to get your prescription updated if your vision changes.  Medicine review. Work with your health care provider to regularly review all of the medicines you are taking, including over-the-counter medicines. Ask your health care provider about any side effects that may make you more likely to have a fall. Tell your health care provider if any medicines that you take make you feel dizzy or sleepy.  Osteoporosis screening. Osteoporosis is a condition that causes the bones to get weaker. This can make the bones weak and cause them to break more easily.  Blood pressure screening. Blood pressure changes  and medicines to control blood pressure can make you feel dizzy.  Strength and balance checks. Your health care provider may recommend certain tests to check your strength and balance while standing, walking, or changing positions.  Foot health exam. Foot pain and numbness, as well as not wearing proper footwear, can make you more likely to have a fall.  Depression screening. You may be more likely to have a fall if you have a fear of falling, feel emotionally low, or feel unable to do activities that you used to do.  Alcohol use screening. Using too much alcohol can affect your balance and may make you more likely to have a fall. What actions can I take to lower my risk of falls? General instructions  Talk with your health care provider about your risks for falling. Tell your health care provider if: ? You fall. Be sure to tell your health care provider about all falls, even ones that seem minor. ? You feel dizzy, sleepy, or off-balance.  Take over-the-counter and prescription medicines only as told by your health care provider. These include any supplements.  Eat a healthy diet and maintain a healthy weight. A healthy diet includes low-fat dairy products, low-fat (lean) meats, and fiber from whole grains, beans, and lots of fruits and vegetables. Home safety  Remove any tripping hazards, such as rugs, cords, and clutter.  Install safety equipment such as grab bars in bathrooms and safety rails on stairs.  Keep rooms and walkways well-lit. Activity   Follow a regular exercise  program to stay fit. This will help you maintain your balance. Ask your health care provider what types of exercise are appropriate for you.  If you need a cane or walker, use it as recommended by your health care provider.  Wear supportive shoes that have nonskid soles. Lifestyle  Do not drink alcohol if your health care provider tells you not to drink.  If you drink alcohol, limit how much you  have: ? 0-1 drink a day for women. ? 0-2 drinks a day for men.  Be aware of how much alcohol is in your drink. In the [place] one drink equals one typical bottle of beer (12 oz), one-half glass of wine (5 oz), or one shot of hard liquor (1 oz).  Do not use any products that contain nicotine or tobacco, such as cigarettes and e-cigarettes. If you need help quitting, ask your health care provider. Summary  Having a healthy lifestyle and getting preventive care can help to protect your health and wellness after age 14.  Screening and testing are the best way to find a health problem early and help you avoid having a fall. Early diagnosis and treatment give you the best chance for managing medical conditions that are more common for people who are older than age 70.  Falls are a major cause of broken bones and head injuries in people who are older than age 11. Take precautions to prevent a fall at home.  Work with your health care provider to learn what changes you can make to improve your health and wellness and to prevent falls. This information is not intended to replace advice given to you by your health care provider. Make sure you discuss any questions you have with your health care provider. Document Revised: 06/09/2018 Document Reviewed: 12/30/2016 Elsevier Patient Education  2020 [employer].

## 2019-11-01 NOTE — Assessment & Plan Note (Signed)

## 2019-11-01 NOTE — Assessment & Plan Note (Signed)
Chronic, stable. Continue good hydration status.

## 2019-11-23 ENCOUNTER — Ambulatory Visit
Admission: RE | Admit: 2019-11-23 | Discharge: 2019-11-23 | Disposition: A | Discharge status: Home / Self Care | Payer: PPO | Source: Ambulatory Visit | Attending: Family Medicine | Admitting: Family Medicine

## 2019-11-23 ENCOUNTER — Other Ambulatory Visit: Payer: Self-pay

## 2019-11-23 DIAGNOSIS — Z1231 Encounter for screening mammogram for malignant neoplasm of breast: Secondary | ICD-10-CM | POA: Diagnosis not present

## 2019-11-24 ENCOUNTER — Other Ambulatory Visit: Payer: Self-pay | Admitting: Family Medicine

## 2019-11-24 ENCOUNTER — Other Ambulatory Visit: Payer: Self-pay | Admitting: Cardiovascular Disease

## 2019-11-24 LAB — HM MAMMOGRAPHY

## 2019-11-27 ENCOUNTER — Encounter: Payer: Self-pay | Admitting: Family Medicine

## 2019-11-29 DIAGNOSIS — M1711 Unilateral primary osteoarthritis, right knee: Secondary | ICD-10-CM | POA: Diagnosis not present

## 2019-11-29 DIAGNOSIS — G5702 Lesion of sciatic nerve, left lower limb: Secondary | ICD-10-CM | POA: Diagnosis not present

## 2019-11-29 DIAGNOSIS — S76012A Strain of muscle, fascia and tendon of left hip, initial encounter: Secondary | ICD-10-CM | POA: Diagnosis not present

## 2019-11-29 DIAGNOSIS — M25561 Pain in right knee: Secondary | ICD-10-CM | POA: Diagnosis not present

## 2019-12-05 DIAGNOSIS — M25512 Pain in left shoulder: Secondary | ICD-10-CM | POA: Diagnosis not present

## 2019-12-05 DIAGNOSIS — M7522 Bicipital tendinitis, left shoulder: Secondary | ICD-10-CM | POA: Diagnosis not present

## 2019-12-05 DIAGNOSIS — M19041 Primary osteoarthritis, right hand: Secondary | ICD-10-CM | POA: Diagnosis not present

## 2019-12-05 DIAGNOSIS — M19012 Primary osteoarthritis, left shoulder: Secondary | ICD-10-CM | POA: Diagnosis not present

## 2020-01-17 ENCOUNTER — Telehealth: Payer: Self-pay

## 2020-01-17 NOTE — Telephone Encounter (Signed)
Pt already has appt scheduled 01/18/20 at 9:20 with Dr Lorelei Pont.

## 2020-01-17 NOTE — Telephone Encounter (Signed)
[facility] - Client TELEPHONE ADVICE RECORD [facility] Patient Name: Paige Medina Gender: Female DOB: 1934-02-25 Age: 86 Y 68 M 15 D Return Phone Number: [phone] (Primary) Address: City/State/Zip: [place] [address] Client [facility] - Client Client Site [place] Physician [name] - MD Contact Type Call Who Is Calling Patient / Member / Family / Caregiver Call Type Triage / Clinical Relationship To Patient Self Return Phone Number [phone] (Primary) Chief Complaint Back Pain - General Reason for Call Symptomatic / Request for [place] states she is having pain on the left side of her back. Translation No Nurse Assessment Nurse: Rock Nephew, RN, Juliann Pulse Date/Time Eilene Ghazi Time): 01/17/2020 2:38:52 PM Confirm and document reason for call. If symptomatic, describe symptoms. ---Caller states she is having pain on the left side of her back. Does the patient have any new or worsening symptoms? ---Yes Will a triage be completed? ---Yes Related visit to physician within the last 2 weeks? ---No Does the PT have any chronic conditions? (i.e. diabetes, asthma, this includes High risk factors for pregnancy, etc.) ---Yes List chronic conditions. ---spinal stenosis, bulging disc, diabetes, HTN Is this a behavioral health or substance abuse call? ---No Guidelines Guideline Title Affirmed Question Affirmed Notes Nurse Date/Time (Eastern Time) Back Pain High-risk adult (e.g., history of cancer, HIV, or IV drug use) Wells, RN, Juliann Pulse 01/17/2020 2:40:26 PM Disp. Time Eilene Ghazi Time) Disposition Final User 01/17/2020 2:42:18 PM See PCP within 24 Hours Yes Rock Nephew, RN, Gara Kroner Disagree/Comply Comply Caller Understands Yes PreDisposition Call Doctor PLEASE NOTE: All timestamps contained within this report are represented as [place] Standard  Time. CONFIDENTIALTY NOTICE: This fax transmission is intended only for the addressee. It contains information that is legally privileged, confidential or otherwise protected from use or disclosure. If you are not the intended recipient, you are strictly prohibited from reviewing, disclosing, copying using or disseminating any of this information or taking any action in reliance on or regarding this information. If you have received this fax in error, please notify us immediately by telephone so that we can arrange for its return to [place]. Phone: [phone], Toll-Free: [phone], Fax: [phone] Page: 2 of 2 Call Id: [id number] Care Advice Given Per Guideline SEE PCP WITHIN 24 HOURS: * IF OFFICE WILL BE OPEN: You need to be examined within the next 24 hours. Call your doctor (or NP/PA) when the office opens and make an appointment. CALL BACK IF: * You become worse CARE ADVICE given per Back Pain (Adult) guideline. Referrals REFERRED TO PCP OFFICE

## 2020-01-18 ENCOUNTER — Encounter: Payer: Self-pay | Admitting: Family Medicine

## 2020-01-18 ENCOUNTER — Ambulatory Visit (INDEPENDENT_AMBULATORY_CARE_PROVIDER_SITE_OTHER): Payer: PPO | Admitting: Family Medicine

## 2020-01-18 ENCOUNTER — Other Ambulatory Visit: Payer: Self-pay

## 2020-01-18 VITALS — BP 122/60 | HR 58 | Temp 97.1°F | Ht 66.34 in | Wt 141.0 lb

## 2020-01-18 DIAGNOSIS — S39011A Strain of muscle, fascia and tendon of abdomen, initial encounter: Secondary | ICD-10-CM

## 2020-01-18 DIAGNOSIS — R109 Unspecified abdominal pain: Secondary | ICD-10-CM | POA: Diagnosis not present

## 2020-01-18 LAB — POC URINALSYSI DIPSTICK (AUTOMATED)
Bilirubin, UA: NEGATIVE
Blood, UA: NEGATIVE
Glucose, UA: NEGATIVE
Ketones, UA: NEGATIVE
Leukocytes, UA: NEGATIVE
Nitrite, UA: NEGATIVE
Protein, UA: NEGATIVE
Spec Grav, UA: 1.01 (ref 1.010–1.025)
Urobilinogen, UA: 0.2 E.U./dL
pH, UA: 6 (ref 5.0–8.0)

## 2020-01-18 NOTE — Progress Notes (Signed)
[name], MD, [name]  [facility] [place] at [facility] [place], [address]  Phone: [phone]  FAX: [place] - 86 y.o. female  MRN [id number]  Date of Birth: 12-23-1933  Date: 01/18/2020  PCP: [name], MD  Referral: [name], MD  Chief Complaint  Patient presents with  . Left Flank Pain    This visit occurred during the SARS-CoV-2 public health emergency.  Safety protocols were in place, including screening questions prior to the visit, additional usage of staff PPE, and extensive cleaning of exam room while observing appropriate contact time as indicated for disinfecting solutions.   Subjective:   Paige SPRUIELL is a 86 y.o. very pleasant female patient with Body mass index is 22.53 kg/m. who presents with the following:  She does have some deep left lateral pain.  Does have a feeling as if she has some gas from belching as well as passing flatus.  She does have a deep ache in this region.  Is been present for about 1 month.  Additional history is provided by her daughter.  She has known history of nephrolithiasis.  Generally speaking she does go to the gym about 3 times a week.  She also has a history of having a renal cyst, but otherwise benign intra-abdominal findings.  There is some pain on palpation.  Oblique strain  Review of Systems is noted in the HPI, as appropriate  Objective:   BP 122/60   Pulse (!) 58   Temp (!) 97.1 F (36.2 C) (Temporal)   Ht 5' 6.34" (1.685 m)   Wt 141 lb (64 kg)   SpO2 98%   BMI 22.53 kg/m   GEN: No acute distress; alert,appropriate. PULM: Breathing comfortably in no respiratory distress PSYCH: Normally interactive.  ABD: S, NT, ND, + BS, No rebound, No HSM  No CVAT  Abdomen is nontender throughout the entirety of the right lower quadrant, hypogastric quadrant, as well as the left lower  quadrant.  Nontender along the erector spinae complex.  With deep palpation on the left lateral side the patient does have some tenderness, and this is reproduced with resisted rotations concentrically and eccentric Lee.  Lateral bending also produces pain.  Laboratory and Imaging Data: Results for orders placed or performed in visit on 01/18/20  POCT Urinalysis Dipstick (Automated)  Result Value Ref Range   Color, UA Yellow    Clarity, UA Clear    Glucose, UA Negative Negative   Bilirubin, UA Negative    Ketones, UA Negative    Spec Grav, UA 1.010 1.010 - 1.025   Blood, UA Negative    pH, UA 6.0 5.0 - 8.0   Protein, UA Negative Negative   Urobilinogen, UA 0.2 0.2 or 1.0 E.U./dL   Nitrite, UA Negative    Leukocytes, UA Negative Negative     Assessment and Plan:     ICD-10-CM   1. Strain of abdominal muscle, initial encounter  S39.011A   2. Left flank pain  R10.9 POCT Urinalysis Dipstick (Automated)   This is musculoskeletal, oblique plus or minus transverses.  Basic range of motion and very basic rehab reviewed with the patient.  Reassurance.  No orders of the defined types were placed in this encounter.  Medications Discontinued During This Encounter  Medication Reason  . miconazole (MICOTIN) 2 % cream Completed Course  . Turmeric 500 MG CAPS Completed Course  Orders Placed This Encounter  Procedures  . POCT Urinalysis Dipstick (Automated)    Follow-up: No follow-ups on file.  Signed,  Maud Deed. Lacara Dunsworth, MD   Outpatient Encounter Medications as of 01/18/2020  Medication Sig  . amLODipine (NORVASC) 5 MG tablet TAKE 1 TABLET(5 MG) BY MOUTH DAILY  . beta carotene w/minerals (OCUVITE) tablet Take 1 tablet by mouth every other day.  . Blood Glucose Monitoring Suppl (ONE TOUCH ULTRA 2) w/Device KIT Use as directed  . Cholecalciferol 125 MCG (5000 UT) TABS Take 1 tablet by mouth every other day.  . clobetasol (TEMOVATE) 0.05 % external solution APPLY A SMALL AMOUNT TO  AFFECTED AREA ONCE A DAY  . clobetasol cream (TEMOVATE) 5.99 % Apply 1 application topically 2 (two) times daily.  . Coenzyme Q10 (CO Q-10) 200 MG CAPS Take 200 mg by mouth every morning.   . docusate sodium (COLACE) 100 MG capsule Take 100 mg by mouth at bedtime.  [name] doxycycline (VIBRA-TABS) 100 MG tablet Take 1 tablet (100 mg total) by mouth daily. (Patient taking differently: Take 100 mg by mouth daily. Takes 2 days on, 1 day off for acne)  . GENTEAL SEVERE 0.3 % GEL ophthalmic ointment   . glucose blood test strip Use as instructed (ultrablue 2 strips) E11.9  . Ivermectin 1 % CREA Apply topically at bedtime.  . Lancets (ONETOUCH ULTRASOFT) lancets Use as instructed E11.9  . lovastatin (MEVACOR) 40 MG tablet TAKE 1 TABLET BY MOUTH EVERY DAY  . metroNIDAZOLE (METROGEL) 0.75 % gel APPLY SMALL AMOUNT EXTERNALLY TO THE AFFECTED AREA 1 TO 2 TIMES DAILY AS DIRECTED  . naproxen sodium (ALEVE) 220 MG tablet Take 220 mg by mouth 2 (two) times daily as needed (for back pain).   [name] olmesartan (BENICAR) 40 MG tablet TAKE 1 TABLET BY MOUTH ONCE DAILY(NEED APPT FOR MORE REFILLS)  . Probiotic Product (PROBIOTIC DAILY PO) Take 1 capsule by mouth at bedtime.   . triamcinolone cream (KENALOG) 0.1 % Apply 1 application topically 2 (two) times daily as needed.  . [DISCONTINUED] miconazole (MICOTIN) 2 % cream Apply 1 application topically daily.  . [DISCONTINUED] Turmeric 500 MG CAPS Take 500 mg by mouth daily.   No facility-administered encounter medications on file as of 01/18/2020.

## 2020-01-22 ENCOUNTER — Ambulatory Visit: Payer: PPO | Admitting: Family Medicine
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
## 2020-02-08 ENCOUNTER — Other Ambulatory Visit
Admission: RE | Admit: 2020-02-08 | Discharge: 2020-02-08 | Disposition: A | Discharge status: Home / Self Care | Payer: PPO | Attending: Physician Assistant | Admitting: Physician Assistant

## 2020-02-08 ENCOUNTER — Other Ambulatory Visit: Payer: Self-pay

## 2020-02-08 ENCOUNTER — Encounter: Payer: Self-pay | Admitting: Physician Assistant

## 2020-02-08 ENCOUNTER — Ambulatory Visit: Payer: PPO | Admitting: Medical

## 2020-02-08 ENCOUNTER — Ambulatory Visit (INDEPENDENT_AMBULATORY_CARE_PROVIDER_SITE_OTHER): Payer: PPO

## 2020-02-08 VITALS — BP 128/64 | HR 75 | Ht 66.0 in | Wt 143.0 lb

## 2020-02-08 DIAGNOSIS — R002 Palpitations: Secondary | ICD-10-CM | POA: Diagnosis not present

## 2020-02-08 DIAGNOSIS — E1122 Type 2 diabetes mellitus with diabetic chronic kidney disease: Secondary | ICD-10-CM | POA: Diagnosis not present

## 2020-02-08 DIAGNOSIS — I10 Essential (primary) hypertension: Secondary | ICD-10-CM

## 2020-02-08 DIAGNOSIS — I493 Ventricular premature depolarization: Secondary | ICD-10-CM | POA: Diagnosis not present

## 2020-02-08 DIAGNOSIS — E782 Mixed hyperlipidemia: Secondary | ICD-10-CM | POA: Diagnosis not present

## 2020-02-08 DIAGNOSIS — N183 Chronic kidney disease, stage 3 unspecified: Secondary | ICD-10-CM | POA: Diagnosis not present

## 2020-02-08 LAB — TSH: TSH: 2.8 u[IU]/mL (ref 0.350–4.500)

## 2020-02-08 NOTE — Patient Instructions (Signed)
Medication Instructions:  No change in medications  *If you need a refill on your cardiac medications before your next appointment, please call your pharmacy*   Lab Work: TSH  Walk into medical mall at the check in desk, they will direct you to lab registration, hours for labs are Monday-Friday 07:00am-5:30pm (no appointment necessary)  If you have any lab test that is abnormal or we need to change your treatment, we will call you to review the results.   Testing/Procedures: Zio monitor (heart monitor)  Your physician has recommended that you wear a Zio monitor. This monitor is a medical device that records the heart's electrical activity. Doctors most often use these monitors to diagnose arrhythmias. Arrhythmias are problems with the speed or rhythm of the heartbeat. The monitor is a small device applied to your chest. You can wear one while you do your normal daily activities. While wearing this monitor if you have any symptoms to push the button and record what you felt. Once you have worn this monitor for the period of time provider prescribed (Usually 14 days), you will return the monitor device in the postage paid box. Once it is returned they will download the data collected and provide [place] with a report which the provider will then review and we will call you with those results. Important tips:  1. Avoid showering during the first 24 hours of wearing the monitor. 2. Avoid excessive sweating to help maximize wear time. 3. Do not submerge the device, no hot tubs, and no swimming pools. 4. Keep any lotions or oils away from the patch. 5. After 24 hours you may shower with the patch on. Take brief showers with your back facing the shower head.  6. Do not remove patch once it has been placed because that will interrupt data and decrease adhesive wear time. 7. Push the button when you have any symptoms and write down what you were feeling. 8. Once you have completed wearing your monitor,  remove and place into box which has postage paid and place in your outgoing mailbox.  9. If for some reason you have misplaced your box then call our office and we can provide another box and/or mail it off for you.         Follow-Up: At [facility], you and your health needs are our priority.  As part of our continuing mission to provide you with exceptional heart care, we have created designated Provider Care Teams.  These Care Teams include your primary Cardiologist (physician) and Advanced Practice Providers (APPs -  Physician Assistants and Nurse Practitioners) who all work together to provide you with the care you need, when you need it.  We recommend signing up for the patient portal called "MyChart".  Sign up information is provided on this After Visit Summary.  MyChart is used to connect with patients for Virtual Visits (Telemedicine).  Patients are able to view lab/test results, encounter notes, upcoming appointments, etc.  Non-urgent messages can be sent to your provider as well.   To learn more about what you can do with MyChart, go to [email or web address].    Your next appointment:   1 month(s)  The format for your next appointment:   In Person  Provider:   [name], MD   Other Instructions n/a

## 2020-03-15 DIAGNOSIS — M7062 Trochanteric bursitis, left hip: Secondary | ICD-10-CM | POA: Diagnosis not present

## 2020-03-15 DIAGNOSIS — M1711 Unilateral primary osteoarthritis, right knee: Secondary | ICD-10-CM | POA: Diagnosis not present

## 2020-03-25 DIAGNOSIS — M1711 Unilateral primary osteoarthritis, right knee: Secondary | ICD-10-CM | POA: Diagnosis not present

## 2020-03-25 DIAGNOSIS — M7522 Bicipital tendinitis, left shoulder: Secondary | ICD-10-CM | POA: Diagnosis not present

## 2020-03-25 DIAGNOSIS — M7582 Other shoulder lesions, left shoulder: Secondary | ICD-10-CM | POA: Diagnosis not present

## 2020-03-25 DIAGNOSIS — M19012 Primary osteoarthritis, left shoulder: Secondary | ICD-10-CM | POA: Diagnosis not present

## 2020-03-26 ENCOUNTER — Ambulatory Visit: Payer: Medicare Other | Admitting: Nurse Practitioner

## 2020-03-26 ENCOUNTER — Encounter: Payer: Self-pay | Admitting: Cardiovascular Disease

## 2020-03-26 ENCOUNTER — Other Ambulatory Visit: Payer: Self-pay

## 2020-03-26 VITALS — BP 140/60 | HR 59 | Ht 66.0 in | Wt 143.2 lb

## 2020-03-26 DIAGNOSIS — I491 Atrial premature depolarization: Secondary | ICD-10-CM | POA: Diagnosis not present

## 2020-03-26 DIAGNOSIS — E782 Mixed hyperlipidemia: Secondary | ICD-10-CM

## 2020-03-26 DIAGNOSIS — I493 Ventricular premature depolarization: Secondary | ICD-10-CM

## 2020-03-26 DIAGNOSIS — I10 Essential (primary) hypertension: Secondary | ICD-10-CM

## 2020-03-26 DIAGNOSIS — I471 Supraventricular tachycardia: Secondary | ICD-10-CM | POA: Diagnosis not present
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
## 2020-03-26 NOTE — Patient Instructions (Signed)

## 2020-04-01 DIAGNOSIS — M1711 Unilateral primary osteoarthritis, right knee: Secondary | ICD-10-CM | POA: Diagnosis not present

## 2020-04-02 ENCOUNTER — Telehealth: Payer: Self-pay

## 2020-04-02 DIAGNOSIS — E119 Type 2 diabetes mellitus without complications: Secondary | ICD-10-CM

## 2020-04-02 MED ORDER — ONETOUCH DELICA LANCETS 33G MISC: 2 refills | Status: DC

## 2020-04-02 MED ORDER — ONETOUCH ULTRA VI STRP: ORAL_STRIP | 2 refills | Status: DC

## 2020-04-02 NOTE — Telephone Encounter (Signed)
E-scribed refills.  

## 2020-04-02 NOTE — Chronic Care Management (AMB) (Addendum)
Chronic Care Management Pharmacy Assistant   Name: Paige Medina  MRN: [id number] DOB: Feb 17, 1934  Reason for Encounter: CCM follow up appointment reminder   Patient Questions:  1.  Have you seen any other providers since your last visit? Yes 03/26/20 Paige Medina- Cardiology 03/25/20  Paige Medina, [place]- Orthopedics 1/14/22James Mikle Medina, [place]  02/08/20- Paige Medina- Cardiology 01/18/20- Paige Medina- PCP. 12/05/19-Paige Medina, [place]- Orthopedics  11/29/19- Paige Hippo, [place]- Orthopedics  11/01/19- Paige Medina- PCP   PCP : Paige Bush, MD  Allergies:   Allergies  Allergen Reactions   Lidocaine Itching, Swelling and Other (See Comments)    Blisters, tetracaine okay for epidural injections   Lipitor [Atorvastatin] Other (See Comments)    myalgias   Latex Other (See Comments)    Blisters   Ciprofloxacin Nausea Only   Oxycodone Nausea Only and Other (See Comments)    constipation   Tape Other (See Comments)    Blisters--(Paper or cloth tape okay)   Tegaderm Ag Mesh [Silver] Rash   Tramadol Nausea Only and Other (See Comments)    constipation    Medications: Outpatient Encounter Medications as of 04/02/2020  Medication Sig   amLODipine (NORVASC) 5 MG tablet TAKE 1 TABLET(5 MG) BY MOUTH DAILY   beta carotene w/minerals (OCUVITE) tablet Take 1 tablet by mouth every other day.   Blood Glucose Monitoring Suppl (ONE TOUCH ULTRA 2) w/Device KIT Use as directed   Cholecalciferol 125 MCG (5000 UT) TABS Take 1 tablet by mouth every other day.   clobetasol (TEMOVATE) 0.05 % external solution APPLY A SMALL AMOUNT TO AFFECTED AREA ONCE A DAY   Coenzyme Q10 (CO Q-10) 200 MG CAPS Take 200 mg by mouth every morning.    docusate sodium (COLACE) 100 MG capsule Take 100 mg by mouth 2 (two) times daily.   doxycycline (VIBRA-TABS) 100 MG tablet Take 1 tablet (100 mg total) by mouth daily. (Patient taking differently: Take 100 mg by mouth daily. Takes 2  days on, 1 day off for acne)   GENTEAL SEVERE 0.3 % GEL ophthalmic ointment    glucose blood test strip Use as instructed (ultrablue 2 strips) E11.9   Ivermectin 1 % CREA Apply topically at bedtime.   Lancets (ONETOUCH ULTRASOFT) lancets Use as instructed E11.9   lovastatin (MEVACOR) 40 MG tablet TAKE 1 TABLET BY MOUTH EVERY DAY   metroNIDAZOLE (METROGEL) 0.75 % gel APPLY SMALL AMOUNT EXTERNALLY TO THE AFFECTED AREA 1 TO 2 TIMES DAILY AS DIRECTED   naproxen sodium (ALEVE) 220 MG tablet Take 220 mg by mouth 2 (two) times daily as needed (for back pain).    olmesartan (BENICAR) 40 MG tablet TAKE 1 TABLET BY MOUTH ONCE DAILY(NEED APPT FOR MORE REFILLS)   Probiotic Product (PROBIOTIC DAILY PO) Take 1 capsule by mouth at bedtime.    triamcinolone cream (KENALOG) 0.1 % Apply 1 application topically 2 (two) times daily as needed.   No facility-administered encounter medications on file as of 04/02/2020.    Current Diagnosis: Patient Active Problem List   Diagnosis Date Noted   Chronic low back pain 10/21/2018   Breast cancer, left ([place]) 01/02/2018   Osteopenia of left forearm 01/02/2018   Simple cyst of kidney 11/06/2017   Lesion of left native kidney 10/20/2017   Status post unicompartmental knee replacement, left 09/17/2016   Health maintenance examination 10/04/2015   Vulvar dermatitis 12/24/2014   Advanced care planning/counseling discussion 09/19/2013   Medicare annual wellness visit, subsequent 09/14/2013  CKD stage 3 due to type 2 diabetes mellitus ([place]) 06/13/2013   Osteoarthritis    Diabetes mellitus without complication ([place])    Hypertension    Hyperlipidemia associated with type 2 diabetes mellitus ([place])    Rosacea    History of breast cancer      Contacted Paige Medina ahead of their visit with CPP, [name], Pharm. D to review any health or medication changes. Patient denied changes to health or medications.   Are you having any problems with your medications?  Not at this time.   Any concerns Paige Medina would like to discuss with the pharmacist? No concerns.   Patient reminded to have all medications, supplements and any blood sugar and blood pressure readings available for review with [name], Pharm. D, at their telephone visit on 04/04/20 at 1:00 PM.    Patient also stated that she was out of her needles, delica one touch lancets and test strips. Advised patient I will relay the message. She would like these sent to [facility] on [address] in [place].   Follow-Up:  Pharmacist Review  [name], CPP notified  Margaretmary Dys, [place] [phone]  Total time spent for month: 10:04  Requested refill on diabetic supplies from Dr. Danise Mina.  [name], PharmD Clinical Pharmacist [facility] at [facility] [phone]

## 2020-04-02 NOTE — Telephone Encounter (Signed)
-----   Message from [place], [facility] sent at 04/02/2020  2:55 PM EST ----- Regarding: Diabetic Supplies Refill During CMA call today patient requested refill on One Tough Delica lancets, test trips and pen needles to current pharmacy, [place].  [name], PharmD Clinical Pharmacist [facility] at [facility] [phone]

## 2020-04-04 ENCOUNTER — Ambulatory Visit (INDEPENDENT_AMBULATORY_CARE_PROVIDER_SITE_OTHER): Payer: Medicare Other

## 2020-04-04 ENCOUNTER — Other Ambulatory Visit: Payer: Self-pay

## 2020-04-04 DIAGNOSIS — I10 Essential (primary) hypertension: Secondary | ICD-10-CM | POA: Diagnosis not present

## 2020-04-04 DIAGNOSIS — E1169 Type 2 diabetes mellitus with other specified complication: Secondary | ICD-10-CM | POA: Diagnosis not present

## 2020-04-04 DIAGNOSIS — E785 Hyperlipidemia, unspecified: Secondary | ICD-10-CM

## 2020-04-04 NOTE — Patient Instructions (Addendum)
Dear Paige Medina,  Below is a summary of the goals we discussed during our follow up appointment on April 04, 2020. Please contact me anytime with questions or concerns.   Visit Information  Goals Addressed            This Visit's Progress   . Pharmacy Care Plan       CARE PLAN ENTRY  Current Barriers:  . Chronic Disease Management support, education, and care coordination needs related to Hypertension and Hyperlipidemia   Hypertension BP Readings from Last 3 Encounters:  03/26/20 140/60  02/08/20 128/64  01/18/20 122/60 .  Pharmacist Clinical Goal(s): o Over the next 6 months, patient will work with PharmD and providers to maintain BP goal <140/90 mmHg . Current regimen:   Olmesartan 40 mg - take 1 tablet daily   Amlodipine 5 mg - take 1 tablet daily  . Interventions: o Reviewed home blood pressure readings - 135/69, 54 (03/19/20), 130/86, 55 (03/02/20), 138/81, 55 (03/12/20) . Patient self care activities - Over the next 6 months, patient will: o Continue to check blood pressure 2-3 times per month, document, and provide at future appointments  Hyperlipidemia Lab Results  Component Value Date/Time   LDLCALC 108 (H) 10/25/2019 08:10 AM   LDLCALC 50 03/15/2013 12:00 AM .  Pharmacist Clinical Goal(s): o Over the next 6 months, patient will work with PharmD and providers to maintain LDL goal < 100 . Current regimen:  o Lovastatin 40 mg - take one tablet daily at bedtime . Interventions: o Discussed exercise - Continue exercise and heart healthy diet . Patient self care activities - Over the next 6 months, patient will: . Continue going to the [facility] three days weekly   Please see past updates related to this goal by clicking on the "Past Updates" button in the selected goal        The patient verbalized understanding of instructions, educational materials, and care plan provided today and declined offer to receive copy of patient instructions, educational  materials, and care plan.   Telephone follow up appointment with pharmacy team member scheduled for: October 01, 2020 at 1 PM (phone)  [name], PharmD Clinical Pharmacist [facility] [phone]   Basics of Medicine Management Taking your medicines correctly is an important part of managing or preventing medical problems. Make sure you know what disease or condition your medicine is treating, and how and when to take it. If you do not take your medicine correctly, it may not work well and may cause unpleasant side effects, including serious health problems. What should I do when I am taking medicines?  Read all the labels and inserts that come with your medicines. Review the information often.  Talk with your pharmacist if you get a refill and notice a change in the size, color, or shape of your medicines.  Know the potential side effects for each medicine that you take.  Try to get all your medicines from the same pharmacy. The pharmacist will have all your information and will understand how your medicines will affect each other (interact).  Tell your health care provider about all your medicines, including over-the-counter medicines, vitamins, and herbal or dietary supplements. He or she will make sure that nothing will interact with any of your prescribed medicines.   How can I take my medicines safely?  Take medicines only as told by your health care provider. ? Do not take more of your medicine than instructed. ?  Do not take anyone else's medicines. ? Do not share your medicines with others. ? Do not stop taking your medicines unless your health care provider tells you to do so. ? You may need to avoid alcohol or certain foods or liquids when taking certain medicines. Follow your health care provider's instructions.  Do not split, mash, or chew your medicines unless your health care provider tells you to do so. Tell your health care provider if you  have trouble swallowing your medicines.  For liquid medicine, use the dosing container that was provided. How should I organize my medicines? Know your medicines  Know what each of your medicines looks like. This includes size, color, and shape. Tell your health care provider if you are having trouble recognizing all the medicines that you are taking.  If you cannot tell your medicines apart because they look similar, keep them in original bottles.  If you cannot read the labels on the bottles, tell your pharmacist to put your medicines in containers with large print.  Review your medicines and your schedule with family members, a friend, or a caregiver. Use a pill organizer  Use a tool to organize your medicine schedule. Tools include a weekly pillbox, a written chart, a notebook, or a calendar.  Your tool should help you remember the following things about each medicine: ? The name of the medicine. ? The amount (dose) to take. ? The schedule. This is the day and time the medicine should be taken. ? The appearance. This includes color, shape, size, and stamp. ? How to take your medicines. This includes instructions to take them with food, without food, with fluids, or with other medicines.  Create reminders for taking your medicines. Use sticky notes, or alarms on your watch, mobile device, or phone calendar.  You may choose to use a more advanced management system. These systems have storage, alarms, and visual and audio prompts.  Some medicines can be taken on an "as-needed" basis. These include medicines for nausea or pain. If you take an as-needed medicine, write down the name and dose, as well as the date and time that you took it.   How should I plan for travel?  Take your pillbox, medicines, and organization system with you when traveling.  Have your medicines refilled before you travel. This will ensure that you do not run out of your medicines while you are away from  home.  Always carry an updated list of your medicines with you. If there is an emergency, a first responder can quickly see what medicines you are taking.  Do not pack your medicines in checked luggage in case your luggage is lost or delayed.  If any of your medicines is considered a controlled substance, make sure you bring a letter from your health care provider with you. How should I store and discard my medicines? For safe storage:  Store medicines in a cool, dry area away from light, or as directed by your health care provider. Do not store medicines in the bathroom. Heat and humidity will affect them.  Do not store your medicines with other chemicals, or with medicines for pets or other household members.  Keep medicines away from children and pets. Do not leave them on counters or bedside tables. Store them in high cabinets or on high shelves. For safe disposal:  Check expiration dates regularly. Do not take expired medicines. Discard medicines that are older than the expiration date.  Learn a safe way to  dispose of your medicines. You may: ? Use a local government, hospital, or pharmacy medicine-take-back program. ? Mix the medicines with inedible substances, put them in a sealed bag or empty container, and throw them in the trash. What should I remember?  Tell your health care provider if you: ? Experience side effects. ? Have new symptoms. ? Have other concerns about taking your medicines.  Review your medicines regularly with your health care provider. Other medicines, diet, medical conditions, weight changes, and daily habits can all affect how medicines work. Ask if you need to continue taking each medicine, and discuss how well each one is working.  Refill your medicines early to avoid running out of them.  In case of an accidental overdose, call your local [place] at [phone] or visit your local emergency department immediately. This is  important. Summary  Taking your medicines correctly is an important part of managing or preventing medical problems.  You need to make sure that you understand what you are taking a medicine for, as well as how and when you need to take it.  Know your medicines and use a pill organizer to help you take your medicines correctly.  In case of an accidental overdose, call your local [place] at [phone] or visit your local emergency department immediately. This is important. This information is not intended to replace advice given to you by your health care provider. Make sure you discuss any questions you have with your health care provider. Document Revised: 02/11/2017 Document Reviewed: 02/11/2017 Elsevier Patient Education  2021 [employer].

## 2020-04-04 NOTE — Chronic Care Management (AMB) (Signed)
Chronic Care Management Pharmacy  Name: Paige Medina  MRN: [id number] DOB: [birth year]  Chief Complaint/ HPI  [name],  87 y.o. , female presents for their Follow-Up CCM visit with the clinical pharmacist in office.  PCP : [name], MD  Their chronic conditions include: HTN, HLD, DM, CKD, arthritis, osteopenia, chronic low back pain, breast cancer   Patient concerns: Patient reports main problem is arthritis. She is getting steroid injections from orthopedic which are helping. Denies any pain today.   Office Visits:   11/01/19: Danise Mina, AWV - OA, continues daily tumeric, HTN stable, HLD, stable. History of breast cancer, mammogram upcoming. DM stable, diet controlled. CKD stable, continue hydration.   2/23/21Danise Mina, DM 6 month f/u, foot exam completed, doing well, rtc 54month  10/21/18: GDanise Mina AWV - no medication changes, recommend Shingrix  Consult Visit:  04/01/20: Orthopedic - right knee Synvisc injection #2  03/26/20: Cardiology - Reviewed Zio monitor, pt is not interested in as needed therapy at this time and will continue conservative management. RTC 6 months.  03/25/20: Orthopedic - right knee Synvisc injection  03/15/20: Orthopedic - The patient does not feel that she experienced any relief following her right knee steroid injection. The patient would like to proceed with viscosupplementation for the right knee. She will follow-up with me on 03/25/2020 for evaluation of left shoulder and right thumb pain.  02/07/21: Cardiology - palpitations, Check TSH. Will order a 14 day Zio. EKG showed frequent PACs with atrial bigeminy and rate PVC with first degree AV block. Might have to re-trial BB. Will see patient back after heart monitor.  01/18/20: [facility] - strain of abnominal muscle, This is musculoskeletal, oblique plus or minus transverses.  Basic range of motion and very basic rehab reviewed with the patient.  Reassurance.  12/05/19: Orthopedic -  follow up   11/29/19: Orthopedic - initial consult, right knee injection  09/28/19: Cardiology - mildly elevated BP, but pt reports some hypotension at home, continue amlodipine and olmesartan, f/u 12 months  03/21/19: [facility] - HTN f/u much improved 120s/60s on amlodipine and olmesartan, no medication changes  Allergies  Allergen Reactions  . Lidocaine Itching, Swelling and Other (See Comments)    Blisters, tetracaine okay for epidural injections  . Lipitor [Atorvastatin] Other (See Comments)    myalgias  . Latex Other (See Comments)    Blisters  . Ciprofloxacin Nausea Only  . Oxycodone Nausea Only and Other (See Comments)    constipation  . Tape Other (See Comments)    Blisters--(Paper or cloth tape okay)  . Tegaderm Ag Mesh [Silver] Rash  . Tramadol Nausea Only and Other (See Comments)    constipation   Medications: Outpatient Encounter Medications as of 04/04/2020  Medication Sig  . amLODipine (NORVASC) 5 MG tablet TAKE 1 TABLET(5 MG) BY MOUTH DAILY  . beta carotene w/minerals (OCUVITE) tablet Take 1 tablet by mouth every other day.  . Blood Glucose Monitoring Suppl (ONE TOUCH ULTRA 2) w/Device KIT Use as directed  . Cholecalciferol 125 MCG (5000 UT) TABS Take 1 tablet by mouth every other day.  . clobetasol (TEMOVATE) 0.05 % external solution APPLY A SMALL AMOUNT TO AFFECTED AREA ONCE A DAY  . Coenzyme Q10 (CO Q-10) 200 MG CAPS Take 200 mg by mouth every morning.   . docusate sodium (COLACE) 100 MG capsule Take 100 mg by mouth 2 (two) times daily.  .Marland Kitchendoxycycline (VIBRA-TABS) 100 MG tablet Take 1 tablet (100 mg total) by mouth  daily. (Patient taking differently: Take 100 mg by mouth daily. Takes 2 days on, 1 day off for acne)  . GENTEAL SEVERE 0.3 % GEL ophthalmic ointment   . glucose blood (ONETOUCH ULTRA) test strip Use as instructed to check blood sugar once daily.  . Ivermectin 1 % CREA Apply topically at bedtime.  . lovastatin (MEVACOR) 40 MG tablet TAKE 1  TABLET BY MOUTH EVERY DAY  . metroNIDAZOLE (METROGEL) 0.75 % gel APPLY SMALL AMOUNT EXTERNALLY TO THE AFFECTED AREA 1 TO 2 TIMES DAILY AS DIRECTED  . olmesartan (BENICAR) 40 MG tablet TAKE 1 TABLET BY MOUTH ONCE DAILY(NEED APPT FOR MORE REFILLS)  . OneTouch Delica Lancets 08Q MISC Use as instructed to check blood sugar once daily.  . Probiotic Product (PROBIOTIC DAILY PO) Take 1 capsule by mouth at bedtime.   . triamcinolone cream (KENALOG) 0.1 % Apply 1 application topically 2 (two) times daily as needed.  . naproxen sodium (ALEVE) 220 MG tablet Take 220 mg by mouth 2 (two) times daily as needed (for back pain).  (Patient not taking: Reported on 04/04/2020)   No facility-administered encounter medications on file as of 04/04/2020.    Current Diagnosis/Assessment:  SDOH Interventions   Flowsheet Row Most Recent Value  SDOH Interventions   Financial Strain Interventions Intervention Not Indicated  [medications affordable]      Goals    . Increase physical activity     Starting 10/12/2017, I will continue exercising for at least 60 min 3 days per week.     [name] Pharmacy Care Plan     CARE PLAN ENTRY  Current Barriers:  . Chronic Disease Management support, education, and care coordination needs related to Hypertension and Hyperlipidemia   Hypertension BP Readings from Last 3 Encounters:  03/26/20 140/60  02/08/20 128/64  01/18/20 122/60 .  Pharmacist Clinical Goal(s): o Over the next 6 months, patient will work with PharmD and providers to maintain BP goal <140/90 mmHg . Current regimen:   Olmesartan 40 mg - take 1 tablet daily   Amlodipine 5 mg - take 1 tablet daily  . Interventions: o Reviewed home blood pressure readings - 135/69, 54 (03/19/20), 130/86, 55 (03/02/20), 138/81, 55 (03/12/20) . Patient self care activities - Over the next 6 months, patient will: o Continue to check blood pressure 2-3 times per month, document, and provide at future appointments  Hyperlipidemia Lab  Results  Component Value Date/Time   LDLCALC 108 (H) 10/25/2019 08:10 AM   LDLCALC 50 03/15/2013 12:00 AM .  Pharmacist Clinical Goal(s): o Over the next 6 months, patient will work with PharmD and providers to maintain LDL goal < 100 . Current regimen:  o Lovastatin 40 mg - take one tablet daily at bedtime . Interventions: o Discussed exercise - Continue exercise and heart healthy diet . Patient self care activities - Over the next 6 months, patient will: . Continue going to the [facility] three days weekly   Please see past updates related to this goal by clicking on the "Past Updates" button in the selected goal        Hyperlipidemia   Lipid Panel     Component Value Date/Time   CHOL 193 10/25/2019 0810   TRIG 161.0 (H) 10/25/2019 0810   TRIG 243 03/15/2013 0000   HDL 53.00 10/25/2019 0810   CHOLHDL 4 10/25/2019 0810   VLDL 32.2 10/25/2019 0810   LDLCALC 108 (H) 10/25/2019 0810   LDLCALC 50 03/15/2013 0000   LDL goal < 100  Patient has failed these meds in past: none reported Patient is currently controlled on the following medications:   Lovastatin 40 mg - take one tablet daily at bedtime  Update 04/04/20: Last LDL at goal. Still taking lovastatin daily. Stopped krill oil. Patient eats fish once or twice weekly and is getting omega-3 fatty acids in Ocuvite.  Plan: Continue current medications  Diabetes   Recent Relevant Labs: Lab Results  Component Value Date/Time   HGBA1C 6.4 10/25/2019 08:10 AM   HGBA1C 6.3 (A) 04/25/2019 08:21 AM   HGBA1C 6.9 (H) 10/14/2018 08:03 AM   MICROALBUR 3.2 (H) 10/12/2017 08:35 AM   MICROALBUR 0.1 06/13/2013 03:17 PM    Patient has failed these meds in past: none Patient is currently controlled on the following medications:   No pharmacotherapy  Last diabetic eye exam:  Lab Results  Component Value Date/Time   HMDIABEYEEXA No Retinopathy 05/08/2019 12:00 AM    Update 04/04/20: Pt purchased new glucose meter supplies recently to keep  close check on BG with steroid injections.  Fasting BG 110 this AM. Discussed exercise - pt reports knee pain is limiting her walking. She goes to the [facility] three days a week for an hour.   Plan: Continue control with diet and exercise   Hypertension   Office blood pressures are  BP Readings from Last 3 Encounters:  03/26/20 140/60  02/08/20 128/64  01/18/20 122/60   CMP Latest Ref Rng & Units 10/25/2019 04/26/2019 10/14/2018  Glucose 70 - 99 mg/dL 130(H) 114(H) 109(H)  BUN 6 - 23 mg/dL 19 25(H) 22  Creatinine 0.40 - 1.20 mg/dL 1.11 1.15 1.08  Sodium 135 - 145 mEq/L 143 138 140  Potassium 3.5 - 5.1 mEq/L 4.7 4.9 4.8  Chloride 96 - 112 mEq/L 105 103 104  CO2 19 - 32 mEq/L 31 28 30   Calcium 8.4 - 10.5 mg/dL 10.0 9.8 9.5  Total Protein 6.0 - 8.3 g/dL 7.0 - 7.2  Total Bilirubin 0.2 - 1.2 mg/dL 0.6 - 0.5  Alkaline Phos 39 - 117 U/L 75 - 74  AST 0 - 37 U/L 16 - 17  ALT 0 - 35 U/L 18 - 15   Patient checks BP at home: a couple times a month, time of day varies   Patient has failed these meds in the past: no beta blocker due to bradycardia, losartan (hypotension, fluctuations), Benicar (cost) Patient is currently controlled on the following medications:   Olmesartan 40 mg - take 1 tablet daily (AM)  Amlodipine 5 mg - take 1 tablet daily (AM)  Update 04/04/20: Pt reports recent home BP monitoring: 135/69, 54 (03/19/20), 130/86, 55 (03/02/20), 138/81, 55 (03/12/20) Feels great. Confirms adherence.   Plan: Continue current medications      Arthritis/Lower back pain   History of bulging disc, bursitis in hip, spinal stenosis Patient has failed these meds in past: oxycodone (constipation)  Patient is currently controlled on the following medications:   Tylenol 325 mg  2 tablets PRN  Tumeric 500 mg - 1 tablet daily  Orthopedic provides steroid and synvisc injections  Update 04/04/20: Confirms taking Tumeric daily. Takes 2 Tylenol most mornings. Very rarely uses naproxen. Uses CBD cream.  Discussed BG elevations possible with frequent steroid injections. She is aware and monitoring BG. Reports injections are helping with pain.  Plan: Continue current medications   Osteopenia   Bone density (02/10/18): T-score (forearm) -2.2  Vitamin D (10/25/19): 36  Patient has failed these meds in past:  None  Patient is currently controlled on the following medications:  (Citracal) Calcium citrate-Vitamin D 400 mg/500 IU - 1 caplet daily  Vitamin D 5000 IU - 1 capsule every other day  We discussed: Pt taking calcium once daily instead of BID due to constipation. Suggested trial off in the past and increase dietary calcium but pt continues and reports constipation stable on 1 calcium daily.  Plan:  Continue current medications   Medication Management   Misc: clobetasol 0.05% cream (PRN vaginal dermatitis), doxycycline 50 mg (PRN rosacea), triamcinolone cream 0.1% (PRN eczema) OTCs: Probiotic, coq10, stool softener, occuvite qod   Patient's preferred pharmacy is:  [facility] [place], [place] [address] [place] [address] Phone: [phone] Fax: [phone]  Uses pill box? Yes Pt endorses compliance  We discussed: Current pharmacy is preferred with insurance plan and patient is satisfied with pharmacy services. Pt reports she switched to [facility] from [facility] and its working very well.   Plan  Continue current medication management strategy  Follow up:  6 month phone visit   [name], PharmD Clinical Pharmacist [facility] at [facility] [phone]

## 2020-04-04 NOTE — Progress Notes (Signed)
I have collaborated with the care management provider regarding care management and care coordination activities outlined in this encounter and have reviewed this encounter including documentation in the note and care plan. I am certifying that I agree with the content of this note and encounter as supervising physician.  

## 2020-04-08 DIAGNOSIS — M1711 Unilateral primary osteoarthritis, right knee: Secondary | ICD-10-CM | POA: Diagnosis not present

## 2020-04-29 ENCOUNTER — Telehealth: Payer: Self-pay | Admitting: Cardiovascular Disease

## 2020-04-29 NOTE — Telephone Encounter (Signed)
Patient calling to discuss zio billing and DOS . Transferred to billing.

## 2020-04-30 ENCOUNTER — Encounter: Payer: Self-pay | Admitting: Family Medicine

## 2020-04-30 ENCOUNTER — Other Ambulatory Visit: Payer: Self-pay

## 2020-04-30 ENCOUNTER — Ambulatory Visit (INDEPENDENT_AMBULATORY_CARE_PROVIDER_SITE_OTHER): Payer: Medicare Other | Admitting: Family Medicine

## 2020-04-30 VITALS — BP 140/62 | HR 67 | Temp 98.0°F | Ht 66.0 in | Wt 141.4 lb

## 2020-04-30 DIAGNOSIS — I10 Essential (primary) hypertension: Secondary | ICD-10-CM | POA: Diagnosis not present

## 2020-04-30 DIAGNOSIS — E119 Type 2 diabetes mellitus without complications: Secondary | ICD-10-CM

## 2020-04-30 LAB — POCT GLYCOSYLATED HEMOGLOBIN (HGB A1C): Hemoglobin A1C: 6.3 % — AB (ref 4.0–5.6)

## 2020-04-30 NOTE — Assessment & Plan Note (Addendum)
Chronic, stable, diet controlled.  Recent readings in prediabetes range.  Congratulated.

## 2020-04-30 NOTE — Patient Instructions (Addendum)
You are doing well today  Return as needed or in 6 months for physical/wellness visit  Happy early birthday! Enjoy your trip to [place].

## 2020-04-30 NOTE — Progress Notes (Signed)
Patient ID: Paige Medina, female    DOB: 12/20/1933, 87 y.o.   MRN: [id number]  This visit was conducted in person.  BP 140/62   Pulse 67   Temp 98 F (36.7 C) (Temporal)   Ht _0  (1.676 m)   Wt 141 lb 7 oz (64.2 kg)   SpO2 97%   BMI 22.83 kg/m    CC: 6 mo DM f/u visit  Subjective:   HPI: Paige Medina is a 87 y.o. female presenting on 04/30/2020 for Diabetes (Here for 6 mo f/u.  Pt states she was recently on prednisone. )   About to celebrate 87th Bday! Just received 3rd synvisc injection to R knee.   HTN - home BP readings 277-412 systolic. No HA, vision changes, CP/tightness, SOB, leg swelling.   DM - does regularly check sugars fasting this morning 110. Compliant with antihyperglycemic regimen which includes: diet controlled. Denies low sugars or hypoglycemic symptoms. Denies paresthesias. Last diabetic eye exam 05/2019 - rpt scheduled 07/2020. Pneumovax: 2007. Prevnar: 2015. Glucometer brand: onetouch. DSME: 2016.  Lab Results  Component Value Date   HGBA1C 6.3 (A) 04/30/2020   Diabetic Foot Exam - Simple   Simple Foot Form Diabetic Foot exam was performed with the following findings: Yes 04/30/2020  3:38 PM  Visual Inspection See comments: Yes Sensation Testing Intact to touch and monofilament testing bilaterally: Yes Pulse Check Posterior Tibialis and Dorsalis pulse intact bilaterally: Yes Comments Thickened brittle great toenails bilaterally    Lab Results  Component Value Date   MICROALBUR 3.2 (H) 10/12/2017         Relevant past medical, surgical, family and social history reviewed and updated as indicated. Interim medical history since our last visit reviewed. Allergies and medications reviewed and updated. Outpatient Medications Prior to Visit  Medication Sig Dispense Refill  . amLODipine (NORVASC) 5 MG tablet TAKE 1 TABLET(5 MG) BY MOUTH DAILY 90 tablet 3  . beta carotene w/minerals (OCUVITE) tablet Take 1 tablet by mouth every other day.     . Blood Glucose Monitoring Suppl (ONE TOUCH ULTRA 2) w/Device KIT Use as directed 1 each 0  . Cholecalciferol 125 MCG (5000 UT) TABS Take 1 tablet by mouth every other day.    . clobetasol (TEMOVATE) 0.05 % external solution APPLY A SMALL AMOUNT TO AFFECTED AREA ONCE A DAY 50 mL 0  . Coenzyme Q10 (CO Q-10) 200 MG CAPS Take 200 mg by mouth every morning.     . docusate sodium (COLACE) 100 MG capsule Take 100 mg by mouth 2 (two) times daily.    [name] doxycycline (VIBRA-TABS) 100 MG tablet Take 1 tablet (100 mg total) by mouth daily. (Patient taking differently: Take 100 mg by mouth daily. Takes 2 days on, 1 day off for acne) 90 tablet 3  . GENTEAL SEVERE 0.3 % GEL ophthalmic ointment     . glucose blood (ONETOUCH ULTRA) test strip Use as instructed to check blood sugar once daily. 100 each 2  . Ivermectin 1 % CREA Apply topically at bedtime.    . lovastatin (MEVACOR) 40 MG tablet TAKE 1 TABLET BY MOUTH EVERY DAY 90 tablet 3  . metroNIDAZOLE (METROGEL) 0.75 % gel APPLY SMALL AMOUNT EXTERNALLY TO THE AFFECTED AREA 1 TO 2 TIMES DAILY AS DIRECTED    . naproxen sodium (ALEVE) 220 MG tablet Take 220 mg by mouth 2 (two) times daily as needed (for back pain).    [name] olmesartan (BENICAR) 40 MG tablet TAKE  1 TABLET BY MOUTH ONCE DAILY(NEED APPT FOR MORE REFILLS) 90 tablet 3  . OneTouch Delica Lancets 99I MISC Use as instructed to check blood sugar once daily. 100 each 2  . Probiotic Product (PROBIOTIC DAILY PO) Take 1 capsule by mouth at bedtime.     . triamcinolone cream (KENALOG) 0.1 % Apply 1 application topically 2 (two) times daily as needed. 30 g 3   No facility-administered medications prior to visit.     Per HPI unless specifically indicated in ROS section below Review of Systems Objective:  BP 140/62   Pulse 67   Temp 98 F (36.7 C) (Temporal)   Ht _0  (1.676 m)   Wt 141 lb 7 oz (64.2 kg)   SpO2 97%   BMI 22.83 kg/m   Wt Readings from Last 3 Encounters:  04/30/20 141 lb 7 oz (64.2 kg)   03/26/20 143 lb 4 oz (65 kg)  02/08/20 143 lb (64.9 kg)      Physical Exam Vitals and nursing note reviewed.  Constitutional:      General: She is not in acute distress.    Appearance: She is well-developed and well-nourished.  HENT:     Head: Normocephalic and atraumatic.     Mouth/Throat:     Mouth: Oropharynx is clear and moist.  Eyes:     General: No scleral icterus.    Extraocular Movements: EOM normal.     Conjunctiva/sclera: Conjunctivae normal.     Pupils: Pupils are equal, round, and reactive to light.  Cardiovascular:     Rate and Rhythm: Normal rate and regular rhythm.     Pulses: Normal pulses and intact distal pulses.     Heart sounds: Murmur (2/6 systolic USB) heard.    Pulmonary:     Effort: Pulmonary effort is normal. No respiratory distress.     Breath sounds: Normal breath sounds. No wheezing, rhonchi or rales.  Musculoskeletal:        General: No edema.     Cervical back: Normal range of motion and neck supple.     Right lower leg: No edema.     Left lower leg: No edema.     Comments: See HPI for foot exam if done  Lymphadenopathy:     Cervical: No cervical adenopathy.  Skin:    General: Skin is warm and dry.     Findings: No rash.  Psychiatric:        Mood and Affect: Mood and affect normal.       Results for orders placed or performed in visit on 04/30/20  POCT glycosylated hemoglobin (Hb A1C)  Result Value Ref Range   Hemoglobin A1C 6.3 (A) 4.0 - 5.6 %   HbA1c POC (<> result, manual entry)     HbA1c, POC (prediabetic range)     HbA1c, POC (controlled diabetic range)     Assessment & Plan:  This visit occurred during the SARS-CoV-2 public health emergency.  Safety protocols were in place, including screening questions prior to the visit, additional usage of staff PPE, and extensive cleaning of exam room while observing appropriate contact time as indicated for disinfecting solutions.   Problem List Items Addressed This Visit     Hypertension    Chronic, adequate readings - continue current regimen.      Diabetes mellitus without complication ([place]) - Primary    Chronic, stable, diet controlled.  Recent readings in prediabetes range.  Congratulated.       Relevant Orders  POCT glycosylated hemoglobin (Hb A1C) (Completed)       No orders of the defined types were placed in this encounter.  Orders Placed This Encounter  Procedures  . POCT glycosylated hemoglobin (Hb A1C)    Patient Instructions  You are doing well today  Return as needed or in 6 months for physical/wellness visit  Happy early birthday! Enjoy your trip to [place].   Follow up plan: Return in about 6 months (around 10/31/2020) for medicare wellness visit, follow up visit.  [name], MD

## 2020-04-30 NOTE — Assessment & Plan Note (Signed)
Chronic, adequate readings - continue current regimen.

## 2020-06-20 ENCOUNTER — Other Ambulatory Visit: Payer: Self-pay

## 2020-06-20 ENCOUNTER — Encounter: Payer: Self-pay | Admitting: Dermatology

## 2020-06-20 ENCOUNTER — Ambulatory Visit (INDEPENDENT_AMBULATORY_CARE_PROVIDER_SITE_OTHER): Payer: Medicare Other | Admitting: Dermatology

## 2020-06-20 DIAGNOSIS — Z1283 Encounter for screening for malignant neoplasm of skin: Secondary | ICD-10-CM | POA: Diagnosis not present

## 2020-06-20 DIAGNOSIS — L820 Inflamed seborrheic keratosis: Secondary | ICD-10-CM

## 2020-06-20 DIAGNOSIS — Z85828 Personal history of other malignant neoplasm of skin: Secondary | ICD-10-CM

## 2020-06-20 DIAGNOSIS — L719 Rosacea, unspecified: Secondary | ICD-10-CM | POA: Diagnosis not present

## 2020-06-20 DIAGNOSIS — L603 Nail dystrophy: Secondary | ICD-10-CM | POA: Diagnosis not present

## 2020-06-20 DIAGNOSIS — L821 Other seborrheic keratosis: Secondary | ICD-10-CM

## 2020-06-20 DIAGNOSIS — Z853 Personal history of malignant neoplasm of breast: Secondary | ICD-10-CM

## 2020-06-20 DIAGNOSIS — L814 Other melanin hyperpigmentation: Secondary | ICD-10-CM

## 2020-06-20 DIAGNOSIS — D229 Melanocytic nevi, unspecified: Secondary | ICD-10-CM

## 2020-06-20 DIAGNOSIS — D1800 Hemangioma unspecified site: Secondary | ICD-10-CM

## 2020-06-20 DIAGNOSIS — L578 Other skin changes due to chronic exposure to nonionizing radiation: Secondary | ICD-10-CM

## 2020-06-20 MED ORDER — DOXYCYCLINE HYCLATE 100 MG PO TABS: 100.0000 mg | ORAL_TABLET | Freq: Every day | ORAL | 3 refills | Status: DC

## 2020-06-20 NOTE — Patient Instructions (Addendum)
  Cryotherapy Aftercare  . Wash gently with soap and water everyday.   [name] Apply Vaseline and Band-Aid daily until healed.    For toenail   Kerasal Multi-Purpose Nail Repair,    If you have any questions or concerns for your doctor, please call our main line at [phone] and press option 4 to reach your doctor's medical assistant. If no one answers, please leave a voicemail as directed and we will return your call as soon as possible. Messages left after 4 pm will be answered the following business day.   You may also send [place] a message via [facility]. We typically respond to MyChart messages within 1-2 business days.  For prescription refills, please ask your pharmacy to contact our office. Our fax number is [phone].  If you have an urgent issue when the clinic is closed that cannot wait until the next business day, you can page your doctor at the number below.    Please note that while we do our best to be available for urgent issues outside of office hours, we are not available 24/7.   If you have an urgent issue and are unable to reach [place], you may choose to seek medical care at your doctor's office, retail clinic, urgent care center, or emergency room.  If you have a medical emergency, please immediately call 911 or go to the emergency department.  Pager Numbers  - Dr. Nehemiah Massed: [phone]  - Dr. Laurence Ferrari: [phone]  - Dr. Nicole Kindred: [phone]  In the event of inclement weather, please call our main line at [phone] for an update on the status of any delays or closures.  Dermatology Medication Tips: Please keep the boxes that topical medications come in in order to help keep track of the instructions about where and how to use these. Pharmacies typically print the medication instructions only on the boxes and not directly on the medication tubes.   If your medication is too expensive, please contact our office at [phone] option 4 or send [place] a message through  [place].   We are unable to tell what your co-pay for medications will be in advance as this is different depending on your insurance coverage. However, we may be able to find a substitute medication at lower cost or fill out paperwork to get insurance to cover a needed medication.   If a prior authorization is required to get your medication covered by your insurance company, please allow [place] 1-2 business days to complete this process.  Drug prices often vary depending on where the prescription is filled and some pharmacies may offer cheaper prices.  The website www.goodrx.com contains coupons for medications through different pharmacies. The prices here do not account for what the cost may be with help from insurance (it may be cheaper with your insurance), but the website can give you the price if you did not use any insurance.  - You can print the associated coupon and take it with your prescription to the pharmacy.  - You may also stop by our office during regular business hours and pick up a GoodRx coupon card.  - If you need your prescription sent electronically to a different pharmacy, notify our office through [facility] or by phone at [phone] option 4.

## 2020-06-20 NOTE — Progress Notes (Signed)
Follow-Up Visit   Subjective  Paige Medina is a 87 y.o. female who presents for the following: Annual Exam (Mole check ). Hx of BCC. Check Rosacea on the face treating with Metro-gel and Doxycycline 4 tablets a week. Pt c/o irritated spots on her body changing.  The patient presents for Total-Body Skin Exam (TBSE) for skin cancer screening and mole check.  The following portions of the chart were reviewed this encounter and updated as appropriate:   Tobacco  Allergies  Meds  Problems  Med Hx  Surg Hx  Fam Hx     Review of Systems:  No other skin or systemic complaints except as noted in HPI or Assessment and Plan.  Objective  Well appearing patient in no apparent distress; mood and affect are within normal limits.  A full examination was performed including scalp, head, eyes, ears, nose, lips, neck, chest, axillae, abdomen, back, buttocks, bilateral upper extremities, bilateral lower extremities, hands, feet, fingers, toes, fingernails, and toenails. All findings within normal limits unless otherwise noted below.  Objective  Left cheek x 1, Left inframammary x 1 (2): Erythematous keratotic or waxy stuck-on papule or plaque.   Objective  face, scalp: Mild pinkness mainly clear   Objective  Left Breast: Well healed scar with no evidence of recurrence, no lymphadenopathy.   Objective  toenails: Nail dystrophy    Assessment & Plan  Inflamed seborrheic keratosis (2) Left cheek x 1, Left inframammary x 1 Destruction of lesion - Left cheek x 1, Left inframammary x 1 Complexity: simple   Destruction method: cryotherapy   Informed consent: discussed and consent obtained   Timeout:  patient name, date of birth, surgical site, and procedure verified Lesion destroyed using liquid nitrogen: Yes   Region frozen until ice ball extended beyond lesion: Yes   Outcome: patient tolerated procedure well with no complications   Post-procedure details: wound care instructions given     Rosacea face, scalp Rosacea/Folliculitis/Occular rosacea- controlled on Doxycyline   Rosacea is a chronic progressive skin condition usually affecting the face of adults, causing redness and/or acne bumps. It is treatable but not curable. It sometimes affects the eyes (ocular rosacea) as well. It may respond to topical and/or systemic medication and can flare with stress, sun exposure, alcohol, exercise and some foods.  Daily application of broad spectrum spf 30+ sunscreen to face is recommended to reduce flares.   Cont Doxycyline 100 gm take 1 tablets daily   Reordered Medications doxycycline (VIBRA-TABS) 100 MG tablet  Hx of breast cancer Left Breast Clear. Observe for recurrence.   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 - Call for any changes  Seborrheic Keratoses - Stuck-on, waxy, tan-brown papules and/or plaques  - Benign-appearing - Discussed benign etiology and prognosis. - Observe - Call for any changes  Melanocytic Nevi - Tan-brown and/or pink-flesh-colored symmetric macules and papules - Benign appearing on exam today - Observation - Call clinic for new or changing moles - Recommend daily use of broad spectrum spf 30+ sunscreen to sun-exposed areas.   Hemangiomas - Red papules - Discussed benign nature - Observe - Call for any changes  Actinic Damage - Chronic condition, secondary to cumulative UV/sun exposure - diffuse scaly erythematous macules with underlying dyspigmentation - Recommend daily broad spectrum sunscreen SPF 30+ to sun-exposed areas, reapply  every 2 hours as needed.  - Staying in the shade or wearing long sleeves, sun glasses (UVA+UVB protection) and wide brim hats (4-inch brim around the entire circumference of the hat) are also recommended for sun protection.  - Call for new or changing lesions.  History of Basal Cell Carcinoma of the Skin Multiple see history  - No evidence of recurrence today - Recommend regular full body skin exams - Recommend daily broad spectrum sunscreen SPF 30+ to sun-exposed areas, reapply every 2 hours as needed.  - Call if any new or changing lesions are noted between office visits  Skin cancer screening performed today.  Return in about 1 year (around 06/20/2021) for TBSE .  IMarye Round, CMA, am acting as scribe for Sarina Ser, MD .  Documentation: I have reviewed the above documentation for accuracy and completeness, and I agree with the above.  Sarina Ser, MD

## 2020-06-23 ENCOUNTER — Encounter: Payer: Self-pay | Admitting: Dermatology

## 2020-07-04 DIAGNOSIS — C50212 Malignant neoplasm of upper-inner quadrant of left female breast: Secondary | ICD-10-CM | POA: Diagnosis not present

## 2020-07-04 DIAGNOSIS — Z170 Estrogen receptor positive status [ER+]: Secondary | ICD-10-CM | POA: Diagnosis not present

## 2020-07-08 DIAGNOSIS — N183 Chronic kidney disease, stage 3 unspecified: Secondary | ICD-10-CM | POA: Diagnosis not present

## 2020-07-08 DIAGNOSIS — M1811 Unilateral primary osteoarthritis of first carpometacarpal joint, right hand: Secondary | ICD-10-CM | POA: Diagnosis not present

## 2020-07-08 DIAGNOSIS — M19041 Primary osteoarthritis, right hand: Secondary | ICD-10-CM | POA: Insufficient documentation

## 2020-07-08 DIAGNOSIS — E1122 Type 2 diabetes mellitus with diabetic chronic kidney disease: Secondary | ICD-10-CM | POA: Diagnosis not present

## 2020-07-10 ENCOUNTER — Telehealth: Payer: Self-pay

## 2020-07-10 NOTE — Chronic Care Management (AMB) (Addendum)
Chronic Care Management Pharmacy Assistant   Name: Paige Medina  MRN: [id number] DOB: [birth year]  Reason for Encounter: Disease State - DM and HTN   Recent office visits:  04/30/2020 - Dr. [name], PCP - No medication changes. F/u 6 months  Recent consult visits:  07/15/2020 - Orthopedics - Back pain injection 06/20/2020 - Dermatology  - Continues Doxycyline 121m 1 tablet daily 04/08/2020 - Orthopedics  - Visco injection Rt. Knee   Hospital visits:   None in previous 6 months  Medications: Outpatient Encounter Medications as of 07/10/2020  Medication Sig   amLODipine (NORVASC) 5 MG tablet TAKE 1 TABLET(5 MG) BY MOUTH DAILY   beta carotene w/minerals (OCUVITE) tablet Take 1 tablet by mouth every other day.   Blood Glucose Monitoring Suppl (ONE TOUCH ULTRA 2) w/Device KIT Use as directed   Cholecalciferol 125 MCG (5000 UT) TABS Take 1 tablet by mouth every other day.   clobetasol (TEMOVATE) 0.05 % external solution APPLY A SMALL AMOUNT TO AFFECTED AREA ONCE A DAY   Coenzyme Q10 (CO Q-10) 200 MG CAPS Take 200 mg by mouth every morning.    docusate sodium (COLACE) 100 MG capsule Take 100 mg by mouth 2 (two) times daily.   doxycycline (VIBRA-TABS) 100 MG tablet Take 1 tablet (100 mg total) by mouth daily. Takes 2 days on, 1 day off for acne   GENTEAL SEVERE 0.3 % GEL ophthalmic ointment    glucose blood (ONETOUCH ULTRA) test strip Use as instructed to check blood sugar once daily.   Ivermectin 1 % CREA Apply topically at bedtime.   lovastatin (MEVACOR) 40 MG tablet TAKE 1 TABLET BY MOUTH EVERY DAY   metroNIDAZOLE (METROGEL) 0.75 % gel APPLY SMALL AMOUNT EXTERNALLY TO THE AFFECTED AREA 1 TO 2 TIMES DAILY AS DIRECTED   naproxen sodium (ALEVE) 220 MG tablet Take 220 mg by mouth 2 (two) times daily as needed (for back pain).   olmesartan (BENICAR) 40 MG tablet TAKE 1 TABLET BY MOUTH ONCE DAILY(NEED APPT FOR MORE REFILLS)   OneTouch Delica Lancets 346NMISC Use as instructed to  check blood sugar once daily.   Probiotic Product (PROBIOTIC DAILY PO) Take 1 capsule by mouth at bedtime.    triamcinolone cream (KENALOG) 0.1 % Apply 1 application topically 2 (two) times daily as needed.   No facility-administered encounter medications on file as of 07/10/2020.   Recent Office Vitals: BP Readings from Last 3 Encounters:  04/30/20 140/62  03/26/20 140/60  02/08/20 128/64   Pulse Readings from Last 3 Encounters:  04/30/20 67  03/26/20 (!) 59  02/08/20 75    Wt Readings from Last 3 Encounters:  04/30/20 141 lb 7 oz (64.2 kg)  03/26/20 143 lb 4 oz (65 kg)  02/08/20 143 lb (64.9 kg)     Kidney Function Lab Results  Component Value Date/Time   CREATININE 1.11 10/25/2019 08:10 AM   CREATININE 1.15 04/26/2019 12:29 PM   CREATININE 1.09 (H) 10/05/2016 11:03 AM   CREATININE 1.10 03/15/2013 12:00 AM   GFR 46.77 (L) 10/25/2019 08:10 AM   GFRNONAA 48 (L) 08/10/2017 01:13 AM   GFRAA 55 (L) 08/10/2017 01:13 AM    BMP Latest Ref Rng & Units 10/25/2019 04/26/2019 10/14/2018  Glucose 70 - 99 mg/dL 130(H) 114(H) 109(H)  BUN 6 - 23 mg/dL 19 25(H) 22  Creatinine 0.40 - 1.20 mg/dL 1.11 1.15 1.08  Sodium 135 - 145 mEq/L 143 138 140  Potassium 3.5 - 5.1 mEq/L 4.7  4.9 4.8  Chloride 96 - 112 mEq/L 105 103 104  CO2 19 - 32 mEq/L _0 Calcium 8.4 - 10.5 mg/dL 10.0 9.8 9.5   Current antihypertensive regimen:  Olmesartan 40 mg - take 1 tablet daily  Amlodipine 5 mg - take 1 tablet daily   Patient verbally confirms she is taking the above medications as directed. Yes the patient reports she is doing fine with her mediations and not having issues with her BP  How often are you checking your Blood Pressure? 1-2x per week Current home BP readings: per the patient she has averages of 126/78  62P                                                                                                            128/72  61P Wrist or arm cuff: arm cuff Salt intake: limits adding to  food  Any readings above 180/120? No  What recent interventions/DTPs have been made by any provider to improve Blood Pressure control since last CPP Visit: none identified  Any recent hospitalizations or ED visits since last visit with CPP? No  What diet changes have been made to improve Blood Pressure Control?  The patient reports she is maintaining healthy choices with meals   What exercise is being done to improve your Blood Pressure Control?   The patient reports she is going to the [facility] weekly   Recent Relevant Labs: Lab Results  Component Value Date/Time   HGBA1C 6.3 (A) 04/30/2020 03:04 PM   HGBA1C 6.4 10/25/2019 08:10 AM   HGBA1C 6.3 (A) 04/25/2019 08:21 AM   HGBA1C 6.9 (H) 10/14/2018 08:03 AM   MICROALBUR 3.2 (H) 10/12/2017 08:35 AM   MICROALBUR 0.1 06/13/2013 03:17 PM    Kidney Function Lab Results  Component Value Date/Time   CREATININE 1.11 10/25/2019 08:10 AM   CREATININE 1.15 04/26/2019 12:29 PM   CREATININE 1.09 (H) 10/05/2016 11:03 AM   CREATININE 1.10 03/15/2013 12:00 AM   GFR 46.77 (L) 10/25/2019 08:10 AM   GFRNONAA 48 (L) 08/10/2017 01:13 AM   GFRAA 55 (L) 08/10/2017 01:13 AM   Current antihyperglycemic regimen: No pharmacotherapy/diet controlled  Have there been any recent hospitalizations or ED visits since last visit with CPP? No  Patient denies hypoglycemic symptoms, including Pale, Sweaty, Shaky, Hungry, Nervous/irritable and Vision changes  Patient denies hyperglycemic symptoms, including blurry vision, excessive thirst, fatigue, polyuria and weakness  How often are you checking your blood sugar? The patient reports infrequent testing   What are your blood sugars ranging?  Fasting:  Averages around  120  On insulin? No  During the week, how often does your blood glucose drop below 70? Never the patient feels her BG's are well controlled   Are you checking your feet daily/regularly? Yes  Adherence Review: Is the patient currently on a  STATIN medication? Yes Is the patient currently on ACE/ARB medication? Yes Does the patient have >5 day gap between last estimated fill dates? No  Star Rating Drugs:  Medication:  Last Fill:  Day Supply Lovastatin 58m 05/22/20 90 Olmesartan 465m3/28/22 90  Follow-Up:  Pharmacist Review  MiDebbora DusCPP notified  VeAvel SensorCGunders[facility] St [facility] Svcslinical Pharmacy Assistant [phone] have reviewed the care management and care coordination activities outlined in this encounter and I am certifying that I agree with the content of this note. No further action required.  MiDebbora DusPharmD Clinical Pharmacist [phone]

## 2020-07-15 DIAGNOSIS — M47816 Spondylosis without myelopathy or radiculopathy, lumbar region: Secondary | ICD-10-CM | POA: Diagnosis not present

## 2020-07-15 DIAGNOSIS — M5441 Lumbago with sciatica, right side: Secondary | ICD-10-CM | POA: Diagnosis not present

## 2020-07-15 DIAGNOSIS — M5442 Lumbago with sciatica, left side: Secondary | ICD-10-CM | POA: Diagnosis not present

## 2020-07-15 DIAGNOSIS — M5416 Radiculopathy, lumbar region: Secondary | ICD-10-CM | POA: Diagnosis not present

## 2020-07-18 ENCOUNTER — Other Ambulatory Visit: Payer: Self-pay | Admitting: Student

## 2020-07-18 DIAGNOSIS — M47816 Spondylosis without myelopathy or radiculopathy, lumbar region: Secondary | ICD-10-CM

## 2020-07-18 DIAGNOSIS — M5416 Radiculopathy, lumbar region: Secondary | ICD-10-CM

## 2020-07-18 DIAGNOSIS — M5442 Lumbago with sciatica, left side: Secondary | ICD-10-CM

## 2020-07-18 DIAGNOSIS — M5441 Lumbago with sciatica, right side: Secondary | ICD-10-CM

## 2020-07-22 DIAGNOSIS — M79644 Pain in right finger(s): Secondary | ICD-10-CM | POA: Diagnosis not present

## 2020-07-22 DIAGNOSIS — M13849 Other specified arthritis, unspecified hand: Secondary | ICD-10-CM | POA: Diagnosis not present

## 2020-07-22 DIAGNOSIS — M1811 Unilateral primary osteoarthritis of first carpometacarpal joint, right hand: Secondary | ICD-10-CM | POA: Diagnosis not present

## 2020-07-23 DIAGNOSIS — M19012 Primary osteoarthritis, left shoulder: Secondary | ICD-10-CM | POA: Diagnosis not present

## 2020-07-23 DIAGNOSIS — M7582 Other shoulder lesions, left shoulder: Secondary | ICD-10-CM | POA: Diagnosis not present

## 2020-07-23 DIAGNOSIS — M7062 Trochanteric bursitis, left hip: Secondary | ICD-10-CM | POA: Diagnosis not present

## 2020-07-23 DIAGNOSIS — M7061 Trochanteric bursitis, right hip: Secondary | ICD-10-CM | POA: Diagnosis not present

## 2020-07-30 ENCOUNTER — Other Ambulatory Visit: Payer: Self-pay | Admitting: Dermatology

## 2020-08-01 ENCOUNTER — Other Ambulatory Visit: Payer: Self-pay

## 2020-08-01 ENCOUNTER — Ambulatory Visit
Admission: RE | Admit: 2020-08-01 | Discharge: 2020-08-01 | Disposition: A | Discharge status: Home / Self Care | Payer: Medicare Other | Source: Ambulatory Visit | Attending: Student | Admitting: Student

## 2020-08-01 DIAGNOSIS — M5441 Lumbago with sciatica, right side: Secondary | ICD-10-CM | POA: Diagnosis not present

## 2020-08-01 DIAGNOSIS — M5442 Lumbago with sciatica, left side: Secondary | ICD-10-CM | POA: Insufficient documentation

## 2020-08-01 DIAGNOSIS — M5416 Radiculopathy, lumbar region: Secondary | ICD-10-CM | POA: Insufficient documentation

## 2020-08-01 DIAGNOSIS — M47816 Spondylosis without myelopathy or radiculopathy, lumbar region: Secondary | ICD-10-CM | POA: Diagnosis not present

## 2020-08-01 DIAGNOSIS — M545 Low back pain, unspecified: Secondary | ICD-10-CM | POA: Diagnosis not present

## 2020-08-09 DIAGNOSIS — E119 Type 2 diabetes mellitus without complications: Secondary | ICD-10-CM | POA: Diagnosis not present

## 2020-08-09 LAB — HM DIABETES EYE EXAM

## 2020-08-14 ENCOUNTER — Encounter: Payer: Self-pay | Admitting: Family Medicine

## 2020-08-20 ENCOUNTER — Other Ambulatory Visit: Payer: Self-pay | Admitting: Cardiovascular Disease

## 2020-08-20 DIAGNOSIS — G8918 Other acute postprocedural pain: Secondary | ICD-10-CM | POA: Diagnosis not present

## 2020-08-20 DIAGNOSIS — M19041 Primary osteoarthritis, right hand: Secondary | ICD-10-CM | POA: Diagnosis not present

## 2020-08-20 HISTORY — PX: METACARPOPHALANGEAL JOINT ARTHRODESIS: SUR59

## 2020-08-28 ENCOUNTER — Other Ambulatory Visit: Payer: Self-pay

## 2020-08-28 MED ORDER — TRIAMCINOLONE ACETONIDE 0.1 % EX CREA: 1.0000 "application " | TOPICAL_CREAM | Freq: Two times a day (BID) | CUTANEOUS | 1 refills | Status: DC | PRN

## 2020-08-28 NOTE — Progress Notes (Signed)
Patient came in asking for RF of Triamcinolone Cream. RX sent in.

## 2020-09-04 DIAGNOSIS — M25641 Stiffness of right hand, not elsewhere classified: Secondary | ICD-10-CM | POA: Diagnosis not present

## 2020-09-04 DIAGNOSIS — Z4789 Encounter for other orthopedic aftercare: Secondary | ICD-10-CM | POA: Diagnosis not present

## 2020-09-04 DIAGNOSIS — M1811 Unilateral primary osteoarthritis of first carpometacarpal joint, right hand: Secondary | ICD-10-CM | POA: Diagnosis not present

## 2020-09-06 ENCOUNTER — Encounter: Payer: Self-pay | Admitting: Family Medicine

## 2020-09-17 ENCOUNTER — Other Ambulatory Visit: Payer: Self-pay

## 2020-09-17 ENCOUNTER — Encounter: Payer: Self-pay | Admitting: Cardiovascular Disease

## 2020-09-17 ENCOUNTER — Ambulatory Visit: Payer: Medicare Other | Admitting: Cardiovascular Disease

## 2020-09-17 VITALS — BP 110/78 | HR 59 | Ht 65.5 in | Wt 134.1 lb

## 2020-09-17 DIAGNOSIS — I10 Essential (primary) hypertension: Secondary | ICD-10-CM | POA: Diagnosis not present

## 2020-09-17 DIAGNOSIS — I493 Ventricular premature depolarization: Secondary | ICD-10-CM | POA: Diagnosis not present

## 2020-09-17 DIAGNOSIS — E785 Hyperlipidemia, unspecified: Secondary | ICD-10-CM | POA: Diagnosis not present

## 2020-09-17 NOTE — Patient Instructions (Signed)
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
## 2020-09-30 ENCOUNTER — Telehealth: Payer: Self-pay

## 2020-09-30 NOTE — Chronic Care Management (AMB) (Addendum)
    Chronic Care Management Pharmacy Assistant   Name: Paige Medina  MRN: [id number] DOB: January 26, 1934   Reason for Encounter: Reminder Call   Conditions to be addressed/monitored: HTN, DMII, and CKD Stage 3  Medications: Outpatient Encounter Medications as of 09/30/2020  Medication Sig   beta carotene w/minerals (OCUVITE) tablet Take 1 tablet by mouth every other day.   Blood Glucose Monitoring Suppl (ONE TOUCH ULTRA 2) w/Device KIT Use as directed   Cholecalciferol 125 MCG (5000 UT) TABS Take 1 tablet by mouth every other day.   clobetasol (TEMOVATE) 0.05 % external solution APPLY A SMALL AMOUNT TO AFFECTED AREA ONCE A DAY   Coenzyme Q10 (CO Q-10) 200 MG CAPS Take 200 mg by mouth every morning.    docusate sodium (COLACE) 100 MG capsule Take 100 mg by mouth 2 (two) times daily.   doxycycline (VIBRA-TABS) 100 MG tablet Take 1 tablet (100 mg total) by mouth daily. Takes 2 days on, 1 day off for acne   GENTEAL SEVERE 0.3 % GEL ophthalmic ointment    glucose blood (ONETOUCH ULTRA) test strip Use as instructed to check blood sugar once daily.   Ivermectin 1 % CREA Apply topically at bedtime.   lovastatin (MEVACOR) 40 MG tablet TAKE 1 TABLET BY MOUTH EVERY DAY   metroNIDAZOLE (METROGEL) 0.75 % gel APPLY SMALL AMOUNT EXTERNALLY TO THE AFFECTED AREA 1 TO 2 TIMES DAILY AS DIRECTED   naproxen sodium (ALEVE) 220 MG tablet Take 220 mg by mouth 2 (two) times daily as needed (for back pain).   olmesartan (BENICAR) 40 MG tablet TAKE 1 TABLET BY MOUTH EVERY DAY   OneTouch Delica Lancets 25K MISC Use as instructed to check blood sugar once daily.   Probiotic Product (PROBIOTIC DAILY PO) Take 1 capsule by mouth at bedtime.    triamcinolone cream (KENALOG) 0.1 % Apply 1 application topically 2 (two) times daily as needed.   No facility-administered encounter medications on file as of 09/30/2020.    [name]  did not answer the phone to remind of upcoming telephone visit with [name]  on 10/01/2020 at 1:00pm. Patient was reminded to have all medications, supplements and any blood glucose and blood pressure readings available for review at appointment. Left voicemail.  Star Rating Drugs: Medication:  Last Fill: Day Supply Olmesartan $RemoveBeforeDEI'40mg'nhUxdehiugVEhYst$  08/09/20 90 Lovastatin $RemoveBefor'40mg'mxPWvwAmjWda$  08/20/20 [place], CPP notified  Avel Sensor, [place] Assistant [phone]  I have reviewed the care management and care coordination activities outlined in this encounter and I am certifying that I agree with the content of this note. No further action required.  [name], PharmD Clinical Pharmacist [facility] at [facility] [phone]

## 2020-10-01 ENCOUNTER — Telehealth: Payer: Medicare Other

## 2020-10-01 ENCOUNTER — Telehealth: Payer: Self-pay

## 2020-10-01 NOTE — Telephone Encounter (Signed)
  Chronic Care Management   Outreach Note  10/01/2020 Name: Paige Medina MRN: [id number] DOB: [birth year]  Referred by: [name], MD Reason for referral : Chronic Care Management  Spoke with patient to discuss health concerns and medication management. Reviewed chart. Patient recently discontinued amlodipine due to hypotension/dizziness under cardiology direction. Her BP is well controlled on olmesartan - 117/62 yesterday on home check. 115/71 last week. She is feeling much better off amlodipine. She denies any health or medications concerns. Last CCM 6 months ago. PCP visit scheduled 11/01/20. Chronic prescriptions include doxycycline, olmesartan, and lovastatin. Affirms adherence. She has added vitamin B and C supplements. No medication concerns identified today. We will touch base in 6 months.  [name], PharmD Clinical Pharmacist [facility] at [facility] [phone]

## 2020-10-02 DIAGNOSIS — M79644 Pain in right finger(s): Secondary | ICD-10-CM | POA: Diagnosis not present

## 2020-10-02 DIAGNOSIS — Z4789 Encounter for other orthopedic aftercare: Secondary | ICD-10-CM | POA: Diagnosis not present

## 2020-10-02 DIAGNOSIS — M1811 Unilateral primary osteoarthritis of first carpometacarpal joint, right hand: Secondary | ICD-10-CM | POA: Diagnosis not present

## 2020-10-10 ENCOUNTER — Other Ambulatory Visit: Payer: Self-pay | Admitting: General Surgery

## 2020-10-10 ENCOUNTER — Other Ambulatory Visit: Payer: Self-pay | Admitting: Cardiovascular Disease

## 2020-10-10 DIAGNOSIS — Z1231 Encounter for screening mammogram for malignant neoplasm of breast: Secondary | ICD-10-CM

## 2020-10-27 ENCOUNTER — Other Ambulatory Visit: Payer: Self-pay | Admitting: Family Medicine

## 2020-10-27 DIAGNOSIS — E1122 Type 2 diabetes mellitus with diabetic chronic kidney disease: Secondary | ICD-10-CM

## 2020-10-27 DIAGNOSIS — E1169 Type 2 diabetes mellitus with other specified complication: Secondary | ICD-10-CM

## 2020-10-27 DIAGNOSIS — E785 Hyperlipidemia, unspecified: Secondary | ICD-10-CM

## 2020-10-27 DIAGNOSIS — N183 Chronic kidney disease, stage 3 unspecified: Secondary | ICD-10-CM

## 2020-10-27 DIAGNOSIS — E119 Type 2 diabetes mellitus without complications: Secondary | ICD-10-CM

## 2020-10-29 ENCOUNTER — Other Ambulatory Visit (INDEPENDENT_AMBULATORY_CARE_PROVIDER_SITE_OTHER): Payer: Medicare Other

## 2020-10-29 ENCOUNTER — Other Ambulatory Visit: Payer: Self-pay

## 2020-10-29 DIAGNOSIS — E1169 Type 2 diabetes mellitus with other specified complication: Secondary | ICD-10-CM

## 2020-10-29 DIAGNOSIS — E785 Hyperlipidemia, unspecified: Secondary | ICD-10-CM

## 2020-10-29 DIAGNOSIS — E1122 Type 2 diabetes mellitus with diabetic chronic kidney disease: Secondary | ICD-10-CM

## 2020-10-29 DIAGNOSIS — N183 Chronic kidney disease, stage 3 unspecified: Secondary | ICD-10-CM | POA: Diagnosis not present

## 2020-10-29 DIAGNOSIS — E119 Type 2 diabetes mellitus without complications: Secondary | ICD-10-CM | POA: Diagnosis not present

## 2020-10-29 LAB — CBC WITH DIFFERENTIAL/PLATELET
Basophils Absolute: 0.1 10*3/uL (ref 0.0–0.1)
Basophils Relative: 1.9 % (ref 0.0–3.0)
Eosinophils Absolute: 0.2 10*3/uL (ref 0.0–0.7)
Eosinophils Relative: 3.3 % (ref 0.0–5.0)
HCT: 41.9 % (ref 36.0–46.0)
Hemoglobin: 13.8 g/dL (ref 12.0–15.0)
Lymphocytes Relative: 29.4 % (ref 12.0–46.0)
Lymphs Abs: 2 10*3/uL (ref 0.7–4.0)
MCHC: 32.9 g/dL (ref 30.0–36.0)
MCV: 96.1 fl (ref 78.0–100.0)
Monocytes Absolute: 0.5 10*3/uL (ref 0.1–1.0)
Monocytes Relative: 7.3 % (ref 3.0–12.0)
Neutro Abs: 4 10*3/uL (ref 1.4–7.7)
Neutrophils Relative %: 58.1 % (ref 43.0–77.0)
Platelets: 238 10*3/uL (ref 150.0–400.0)
RBC: 4.36 Mil/uL (ref 3.87–5.11)
RDW: 13.6 % (ref 11.5–15.5)
WBC: 6.9 10*3/uL (ref 4.0–10.5)

## 2020-10-29 LAB — LIPID PANEL
Cholesterol: 183 mg/dL (ref 0–200)
HDL: 63 mg/dL (ref >39.00)
LDL Cholesterol: 96 mg/dL (ref 0–99)
NonHDL: 119.52
Total CHOL/HDL Ratio: 3
Triglycerides: 118 mg/dL (ref 0.0–149.0)
VLDL: 23.6 mg/dL (ref 0.0–40.0)

## 2020-10-29 LAB — COMPREHENSIVE METABOLIC PANEL
ALT: 19 U/L (ref 0–35)
AST: 21 U/L (ref 0–37)
Albumin: 4.2 g/dL (ref 3.5–5.2)
Alkaline Phosphatase: 73 U/L (ref 39–117)
BUN: 17 mg/dL (ref 6–23)
CO2: 28 mEq/L (ref 19–32)
Calcium: 10 mg/dL (ref 8.4–10.5)
Chloride: 105 mEq/L (ref 96–112)
Creatinine, Ser: 0.98 mg/dL (ref 0.40–1.20)
GFR: 52.64 mL/min — ABNORMAL LOW (ref >60.00)
Glucose, Bld: 104 mg/dL — ABNORMAL HIGH (ref 70–99)
Potassium: 4.4 mEq/L (ref 3.5–5.1)
Sodium: 142 mEq/L (ref 135–145)
Total Bilirubin: 0.7 mg/dL (ref 0.2–1.2)
Total Protein: 7.2 g/dL (ref 6.0–8.3)

## 2020-10-29 LAB — MICROALBUMIN / CREATININE URINE RATIO
Creatinine,U: 47.7 mg/dL
Microalb Creat Ratio: 1.5 mg/g (ref 0.0–30.0)
Microalb, Ur: <0.7 mg/dL (ref 0.0–1.9)

## 2020-10-29 LAB — HEMOGLOBIN A1C: Hgb A1c MFr Bld: 6.1 % (ref 4.6–6.5)

## 2020-10-29 LAB — VITAMIN D 25 HYDROXY (VIT D DEFICIENCY, FRACTURES): VITD: 35.34 ng/mL (ref 30.00–100.00)

## 2020-10-30 LAB — PARATHYROID HORMONE, INTACT (NO CA): PTH: 82 pg/mL — ABNORMAL HIGH (ref 16–77)

## 2020-11-01 ENCOUNTER — Ambulatory Visit: Payer: Medicare Other

## 2020-11-05 ENCOUNTER — Other Ambulatory Visit: Payer: Self-pay

## 2020-11-05 ENCOUNTER — Encounter: Payer: Self-pay | Admitting: Family Medicine

## 2020-11-05 ENCOUNTER — Ambulatory Visit (INDEPENDENT_AMBULATORY_CARE_PROVIDER_SITE_OTHER): Payer: Medicare Other | Admitting: Family Medicine

## 2020-11-05 VITALS — BP 140/68 | HR 70 | Temp 98.0°F | Ht 65.0 in | Wt 135.4 lb

## 2020-11-05 DIAGNOSIS — I10 Essential (primary) hypertension: Secondary | ICD-10-CM

## 2020-11-05 DIAGNOSIS — M8949 Other hypertrophic osteoarthropathy, multiple sites: Secondary | ICD-10-CM

## 2020-11-05 DIAGNOSIS — E119 Type 2 diabetes mellitus without complications: Secondary | ICD-10-CM

## 2020-11-05 DIAGNOSIS — M159 Polyosteoarthritis, unspecified: Secondary | ICD-10-CM

## 2020-11-05 DIAGNOSIS — Z0000 Encounter for general adult medical examination without abnormal findings: Secondary | ICD-10-CM

## 2020-11-05 DIAGNOSIS — E1122 Type 2 diabetes mellitus with diabetic chronic kidney disease: Secondary | ICD-10-CM

## 2020-11-05 DIAGNOSIS — M85832 Other specified disorders of bone density and structure, left forearm: Secondary | ICD-10-CM

## 2020-11-05 DIAGNOSIS — Z853 Personal history of malignant neoplasm of breast: Secondary | ICD-10-CM

## 2020-11-05 DIAGNOSIS — E1169 Type 2 diabetes mellitus with other specified complication: Secondary | ICD-10-CM

## 2020-11-05 DIAGNOSIS — N183 Chronic kidney disease, stage 3 unspecified: Secondary | ICD-10-CM

## 2020-11-05 DIAGNOSIS — E785 Hyperlipidemia, unspecified: Secondary | ICD-10-CM

## 2020-11-05 NOTE — Assessment & Plan Note (Addendum)
Has decided to stop turmeric due to poor effect and increased bruising noted.

## 2020-11-05 NOTE — Assessment & Plan Note (Signed)
Chronic, diet controlled - recently readings actually in prediabetes range.

## 2020-11-05 NOTE — Assessment & Plan Note (Signed)
Preventative protocols reviewed and updated unless pt declined. Discussed healthy diet and lifestyle.  

## 2020-11-05 NOTE — Assessment & Plan Note (Addendum)
Chronic, improvement noted today.

## 2020-11-05 NOTE — Assessment & Plan Note (Signed)
Continue calcium, vit D, she does have increased fracture risk based on latest DEXA.

## 2020-11-05 NOTE — Assessment & Plan Note (Signed)
Chronic, stable. Continue current regimen. 

## 2020-11-05 NOTE — Assessment & Plan Note (Signed)
Chronic, improving readings noted. Continue lovastatin.  The ASCVD Risk score Paige Bussing DC Jr., et al., 2013) failed to calculate for the following reasons:   The 2013 ASCVD risk score is only valid for ages 44 to 1

## 2020-11-05 NOTE — Patient Instructions (Addendum)
If interested, check with pharmacy about new 2 shot shingles series (shingrix).  You are doing well today. Good to see you today Return as needed or in 1 year for next physical.   Health Maintenance After Age 85 After age 32, you are at a higher risk for certain long-term diseases and infections as well as injuries from falls. Falls are a major cause of broken bones and head injuries in people who are older than age 62. Getting regular preventive care can help to keep you healthy and well. Preventive care includes getting regular testing and making lifestyle changes as recommended by your health care provider. Talk with your health care provider about: Which screenings and tests you should have. A screening is a test that checks for a disease when you have no symptoms. A diet and exercise plan that is right for you. What should I know about screenings and tests to prevent falls? Screening and testing are the best ways to find a health problem early. Early diagnosis and treatment give you the best chance of managing medical conditions that are common after age 74. Certain conditions and lifestyle choices may make you more likely to have a fall. Your health care provider may recommend: Regular vision checks. Poor vision and conditions such as cataracts can make you more likely to have a fall. If you wear glasses, make sure to get your prescription updated if your vision changes. Medicine review. Work with your health care provider to regularly review all of the medicines you are taking, including over-the-counter medicines. Ask your health care provider about any side effects that may make you more likely to have a fall. Tell your health care provider if any medicines that you take make you feel dizzy or sleepy. Osteoporosis screening. Osteoporosis is a condition that causes the bones to get weaker. This can make the bones weak and cause them to break more easily. Blood pressure screening. Blood pressure  changes and medicines to control blood pressure can make you feel dizzy. Strength and balance checks. Your health care provider may recommend certain tests to check your strength and balance while standing, walking, or changing positions. Foot health exam. Foot pain and numbness, as well as not wearing proper footwear, can make you more likely to have a fall. Depression screening. You may be more likely to have a fall if you have a fear of falling, feel emotionally low, or feel unable to do activities that you used to do. Alcohol use screening. Using too much alcohol can affect your balance and may make you more likely to have a fall. What actions can I take to lower my risk of falls? General instructions Talk with your health care provider about your risks for falling. Tell your health care provider if: You fall. Be sure to tell your health care provider about all falls, even ones that seem minor. You feel dizzy, sleepy, or off-balance. Take over-the-counter and prescription medicines only as told by your health care provider. These include any supplements. Eat a healthy diet and maintain a healthy weight. A healthy diet includes low-fat dairy products, low-fat (lean) meats, and fiber from whole grains, beans, and lots of fruits and vegetables. Home safety Remove any tripping hazards, such as rugs, cords, and clutter. Install safety equipment such as grab bars in bathrooms and safety rails on stairs. Keep rooms and walkways well-lit. Activity  Follow a regular exercise program to stay fit. This will help you maintain your balance. Ask your health care  provider what types of exercise are appropriate for you. If you need a cane or walker, use it as recommended by your health care provider. Wear supportive shoes that have nonskid soles. Lifestyle Do not drink alcohol if your health care provider tells you not to drink. If you drink alcohol, limit how much you have: 0-1 drink a day for  women. 0-2 drinks a day for men. Be aware of how much alcohol is in your drink. In the [place] one drink equals one typical bottle of beer (12 oz), one-half glass of wine (5 oz), or one shot of hard liquor (1 oz). Do not use any products that contain nicotine or tobacco, such as cigarettes and e-cigarettes. If you need help quitting, ask your health care provider. Summary Having a healthy lifestyle and getting preventive care can help to protect your health and wellness after age 96. Screening and testing are the best way to find a health problem early and help you avoid having a fall. Early diagnosis and treatment give you the best chance for managing medical conditions that are more common for people who are older than age 9. Falls are a major cause of broken bones and head injuries in people who are older than age 58. Take precautions to prevent a fall at home. Work with your health care provider to learn what changes you can make to improve your health and wellness and to prevent falls. This information is not intended to replace advice given to you by your health care provider. Make sure you discuss any questions you have with your health care provider. Document Revised: 04/26/2020 Document Reviewed: 02/02/2020 Elsevier Patient Education  2022 [employer].

## 2020-11-05 NOTE — Assessment & Plan Note (Signed)

## 2020-11-05 NOTE — Progress Notes (Signed)
Patient ID: Paige Medina, female    DOB: [birth year], 87 y.o.   MRN: [id number]  This visit was conducted in person.  BP 140/68   Pulse 70   Temp 98 F (36.7 C) (Temporal)   Ht $R'5\' 5"'jd$  (1.651 m)   Wt 135 lb 6 oz (61.4 kg)   SpO2 98%   BMI 22.53 kg/m    CC: AMW/CPE Subjective:   HPI: Paige Medina is a 87 y.o. female presenting on 11/05/2020 for Medicare Wellness   Did not see health advisor.   Hearing Screening   '500Hz'$  $Remo'1000Hz'WNTyh$'2000Hz'$'4000Hz'$   Right ear 20 40 40 0  Left ear 20 40 25 0  Comments: Wears bilateral hearing aids.  Not wearing at today's OV.   Vision Screening - Comments:: Last eye exam, 07/2020.  Paige Medina Office Visit from 11/05/2020 in [facility]  PHQ-2 Total Score 0       Fall Risk  11/05/2020 11/01/2019 10/21/2018 10/12/2017 10/07/2016  Falls in the past year? 0 0 0 Yes Yes  Comment - - - fall occurred while walking up stairs; denies injury pt reports tripping over a cord which resulted in a fall  Number falls in past yr: - 0 - 1 1  Comment - - - - -  Injury with Fall? - 0 - No No  Follow up - Falls evaluation completed - - -   Home BPs run 120/70s.  Bosniak 2 L kidney lesion - unchanged over a year consistent with hemorrhagic/proteinaceous cyst.    Preventative:   COLONOSCOPY 12/2014 mod diverticulosis o/w WNL, f/u prn Paige Medina) Well woman - s/p hysterectomy 1977, ovaries remain. Denies sxs.  Breast cancer - [facility]1 11/2019 @ [place]. Upcoming appt already scheduled DEXA 01/2018 - T -1.3 LFN, -2.2 L forearm, hip fracture risk 3.3% (elevated) Lung cancer screening - not eligible  Flu yearly  COVID vaccine - Moderna 03/2019, 04/2019, booster 12/2019, 06/2020 Pneumovax 06/2005. prevnar 2015 Td 06/2005  shingrix - discussed.  Advanced directives: Scanned into chart 10/2014. Paige Medina husband then Paige Medina and Paige Medina are [facility]. Does not want prolonged life support if terminal or irreversible condition.  Seat belt use discussed.   Sunscreen use discussed. No changing moles on skin.  Sleep - averages 7-8 hours Non smoker  Alcohol - once a month  Dentist Q6 mo  Eye exam - yearly  Bowel - chronic constipation managed with stool softener and probiotic, occasional miralax  Bladder - no incontinence   Lives with husband, no pets   Grown children   Occupation: retired, varied jobs Veterinary surgeon   Activity: gym 3d/wk, active mentally with crosswords   Diet: good water, fruits/vegetables daily      Relevant past medical, surgical, family and social history reviewed and updated as indicated. Interim medical history since our last visit reviewed. Allergies and medications reviewed and updated. Outpatient Medications Prior to Visit  Medication Sig Dispense Refill   b complex vitamins capsule Take 1 capsule by mouth daily.     beta carotene w/minerals (OCUVITE) tablet Take 1 tablet by mouth every other day.     Blood Glucose Monitoring Suppl (ONE TOUCH ULTRA 2) w/Device KIT Use as directed 1 each 0   Cholecalciferol 125 MCG (5000 UT) TABS Take 1 tablet by mouth every other day.     clobetasol (TEMOVATE) 0.05 % external solution APPLY A SMALL AMOUNT TO AFFECTED AREA ONCE A DAY 50 mL 0   Coenzyme Q10 (CO Q-10)  200 MG CAPS Take 200 mg by mouth every morning.      docusate sodium (COLACE) 100 MG capsule Take 100 mg by mouth 2 (two) times daily.     doxycycline (VIBRA-TABS) 100 MG tablet Take 1 tablet (100 mg total) by mouth daily. Takes 2 days on, 1 day off for acne 90 tablet 3   GENTEAL SEVERE 0.3 % GEL ophthalmic ointment      glucose blood (ONETOUCH ULTRA) test strip Use as instructed to check blood sugar once daily. 100 each 2   Ivermectin 1 % CREA Apply topically at bedtime.     lovastatin (MEVACOR) 40 MG tablet TAKE 1 TABLET BY MOUTH EVERY DAY 90 tablet 3   metroNIDAZOLE (METROGEL) 0.75 % gel APPLY SMALL AMOUNT EXTERNALLY TO THE AFFECTED AREA 1 TO 2 TIMES DAILY AS DIRECTED 45 g 9   naproxen sodium (ALEVE) 220 MG tablet  Take 220 mg by mouth 2 (two) times daily as needed (for back pain).     olmesartan (BENICAR) 40 MG tablet TAKE 1 TABLET BY MOUTH EVERY DAY 90 tablet 2   OneTouch Delica Lancets 85Y MISC Use as instructed to check blood sugar once daily. 100 each 2   Probiotic Product (PROBIOTIC DAILY PO) Take 1 capsule by mouth at bedtime.      triamcinolone cream (KENALOG) 0.1 % Apply 1 application topically 2 (two) times daily as needed. 30 g 1   vitamin C (ASCORBIC ACID) 500 MG tablet Take 500 mg by mouth daily.     No facility-administered medications prior to visit.     Per HPI unless specifically indicated in ROS section below Review of Systems  Constitutional:  Negative for activity change, appetite change, chills, fatigue, fever and unexpected weight change.  HENT:  Negative for hearing loss.   Eyes:  Negative for visual disturbance.  Respiratory:  Positive for cough (dry - ?ARB related). Negative for chest tightness, shortness of breath and wheezing.   Cardiovascular:  Negative for chest pain, palpitations and leg swelling.  Gastrointestinal:  Negative for abdominal distention, abdominal pain, blood in stool, constipation, diarrhea, nausea and vomiting.  Genitourinary:  Negative for difficulty urinating and hematuria.  Musculoskeletal:  Negative for arthralgias, myalgias and neck pain.  Skin:  Negative for rash.  Neurological:  Negative for dizziness, seizures, syncope and headaches.  Hematological:  Negative for adenopathy. Bruises/bleeds easily.  Psychiatric/Behavioral:  Negative for dysphoric mood. The patient is not nervous/anxious.    Objective:  BP 140/68   Pulse 70   Temp 98 F (36.7 C) (Temporal)   Ht 5' 5"  (1.651 m)   Wt 135 lb 6 oz (61.4 kg)   SpO2 98%   BMI 22.53 kg/m   Wt Readings from Last 3 Encounters:  11/05/20 135 lb 6 oz (61.4 kg)  09/17/20 134 lb 2 oz (60.8 kg)  04/30/20 141 lb 7 oz (64.2 kg)      Physical Exam Vitals and nursing note reviewed.  Constitutional:       Appearance: Normal appearance. She is not ill-appearing.  HENT:     Head: Normocephalic and atraumatic.     Right Ear: Tympanic membrane, ear canal and external ear normal. There is no impacted cerumen.     Left Ear: Tympanic membrane, ear canal and external ear normal. There is no impacted cerumen.  Eyes:     General:        Right eye: No discharge.        Left eye: No discharge.  Extraocular Movements: Extraocular movements intact.     Conjunctiva/sclera: Conjunctivae normal.     Pupils: Pupils are equal, round, and reactive to light.  Neck:     Thyroid: No thyroid mass or thyromegaly.     Vascular: No carotid bruit.  Cardiovascular:     Rate and Rhythm: Normal rate and regular rhythm.     Pulses: Normal pulses.     Heart sounds: Normal heart sounds. No murmur heard. Pulmonary:     Effort: Pulmonary effort is normal. No respiratory distress.     Breath sounds: Normal breath sounds. No wheezing, rhonchi or rales.  Abdominal:     General: Bowel sounds are normal. There is no distension.     Palpations: Abdomen is soft. There is no mass.     Tenderness: There is no abdominal tenderness. There is no guarding or rebound.     Hernia: No hernia is present.  Musculoskeletal:     Cervical back: Normal range of motion and neck supple. No rigidity.     Right lower leg: No edema.     Left lower leg: No edema.  Lymphadenopathy:     Cervical: No cervical adenopathy.  Skin:    General: Skin is warm and dry.     Findings: No rash.  Neurological:     General: No focal deficit present.     Mental Status: She is alert. Mental status is at baseline.     Comments:  Recall 3/3 Calculation 5/5 DLROW  Psychiatric:        Mood and Affect: Mood normal.        Behavior: Behavior normal.      Results for orders placed or performed in visit on 10/29/20  Parathyroid hormone, intact (no Ca)  Result Value Ref Range   PTH 82 (H) 16 - 77 pg/mL  Microalbumin / creatinine urine ratio   Result Value Ref Range   Microalb, Ur <0.7 0.0 - 1.9 mg/dL   Creatinine,U 47.7 mg/dL   Microalb Creat Ratio 1.5 0.0 - 30.0 mg/g  CBC with Differential/Platelet  Result Value Ref Range   WBC 6.9 4.0 - 10.5 K/uL   RBC 4.36 3.87 - 5.11 Mil/uL   Hemoglobin 13.8 12.0 - 15.0 g/dL   HCT 41.9 36.0 - 46.0 %   MCV 96.1 78.0 - 100.0 fl   MCHC 32.9 30.0 - 36.0 g/dL   RDW 13.6 11.5 - 15.5 %   Platelets 238.0 150.0 - 400.0 K/uL   Neutrophils Relative % 58.1 43.0 - 77.0 %   Lymphocytes Relative 29.4 12.0 - 46.0 %   Monocytes Relative 7.3 3.0 - 12.0 %   Eosinophils Relative 3.3 0.0 - 5.0 %   Basophils Relative 1.9 0.0 - 3.0 %   Neutro Abs 4.0 1.4 - 7.7 K/uL   Lymphs Abs 2.0 0.7 - 4.0 K/uL   Monocytes Absolute 0.5 0.1 - 1.0 K/uL   Eosinophils Absolute 0.2 0.0 - 0.7 K/uL   Basophils Absolute 0.1 0.0 - 0.1 K/uL  Hemoglobin A1c  Result Value Ref Range   Hgb A1c MFr Bld 6.1 4.6 - 6.5 %  Comprehensive metabolic panel  Result Value Ref Range   Sodium 142 135 - 145 mEq/L   Potassium 4.4 3.5 - 5.1 mEq/L   Chloride 105 96 - 112 mEq/L   CO2 28 19 - 32 mEq/L   Glucose, Bld 104 (H) 70 - 99 mg/dL   BUN 17 6 - 23 mg/dL   Creatinine, Ser 0.98 0.40 -  1.20 mg/dL   Total Bilirubin 0.7 0.2 - 1.2 mg/dL   Alkaline Phosphatase 73 39 - 117 U/L   AST 21 0 - 37 U/L   ALT 19 0 - 35 U/L   Total Protein 7.2 6.0 - 8.3 g/dL   Albumin 4.2 3.5 - 5.2 g/dL   GFR 52.64 (L) >60.00 mL/min   Calcium 10.0 8.4 - 10.5 mg/dL  Lipid panel  Result Value Ref Range   Cholesterol 183 0 - 200 mg/dL   Triglycerides 118.0 0.0 - 149.0 mg/dL   HDL 63.00 >39.00 mg/dL   VLDL 23.6 0.0 - 40.0 mg/dL   LDL Cholesterol 96 0 - 99 mg/dL   Total CHOL/HDL Ratio 3    NonHDL 119.52   VITAMIN D 25 Hydroxy (Vit-D Deficiency, Fractures)  Result Value Ref Range   VITD 35.34 30.00 - 100.00 ng/mL    Assessment & Plan:  This visit occurred during the SARS-CoV-2 public health emergency.  Safety protocols were in place, including screening  questions prior to the visit, additional usage of staff PPE, and extensive cleaning of exam room while observing appropriate contact time as indicated for disinfecting solutions.   Problem List Items Addressed This Visit     Medicare annual wellness visit, subsequent (Chronic)    I have personally reviewed the Medicare Annual Wellness questionnaire and have noted 1. The patient's medical and social history 2. Their use of alcohol, tobacco or illicit drugs 3. Their current medications and supplements 4. The patient's functional ability including ADL's, fall risks, home safety risks and hearing or visual impairment. Cognitive function has been assessed and addressed as indicated.  5. Diet and physical activity 6. Evidence for depression or mood disorders The patients weight, height, BMI have been recorded in the chart. I have made referrals, counseling and provided education to the patient based on review of the above and I have provided the pt with a written personalized care plan for preventive services. Provider list updated.. See scanned questionairre as needed for further documentation. Reviewed preventative protocols and updated unless pt declined.       Health maintenance examination (Chronic)    Preventative protocols reviewed and updated unless pt declined. Discussed healthy diet and lifestyle.       History of breast cancer    Continues yearly mammograms and home breast exams without concerns. Surgeon retired.       Osteoarthritis    Has decided to stop turmeric due to poor effect and increased bruising noted.       Diabetes mellitus without complication (Home)    Chronic, diet controlled - recently readings actually in prediabetes range.       Hypertension    Chronic, stable. Continue current regimen.       Hyperlipidemia associated with type 2 diabetes mellitus (HCC)    Chronic, improving readings noted. Continue lovastatin.  The ASCVD Risk score Mikey Bussing DC Jr., et  al., 2013) failed to calculate for the following reasons:   The 2013 ASCVD risk score is only valid for ages 64 to 51       CKD stage 3 due to type 2 diabetes mellitus (HCC)    Chronic, improvement noted today.       Osteopenia of left forearm    Continue calcium, vit D, she does have increased fracture risk based on latest DEXA.         No orders of the defined types were placed in this encounter.  No orders of the defined types  were placed in this encounter.    Patient instructions: If interested, check with pharmacy about new 2 shot shingles series (shingrix).  You are doing well today. Good to see you today Return as needed or in 1 year for next physical.   Follow up plan: Return in about 1 year (around 11/05/2021) for annual exam, prior fasting for blood work, medicare wellness visit.  [name], MD

## 2020-11-05 NOTE — Assessment & Plan Note (Signed)
Continues yearly mammograms and home breast exams without concerns. Surgeon retired.

## 2020-11-13 ENCOUNTER — Other Ambulatory Visit: Payer: Self-pay | Admitting: Family Medicine

## 2020-11-13 ENCOUNTER — Other Ambulatory Visit: Payer: Self-pay | Admitting: Cardiovascular Disease

## 2020-11-15 DIAGNOSIS — M7582 Other shoulder lesions, left shoulder: Secondary | ICD-10-CM | POA: Diagnosis not present

## 2020-11-15 DIAGNOSIS — M7061 Trochanteric bursitis, right hip: Secondary | ICD-10-CM | POA: Diagnosis not present

## 2020-11-15 DIAGNOSIS — M7062 Trochanteric bursitis, left hip: Secondary | ICD-10-CM | POA: Diagnosis not present

## 2020-11-15 DIAGNOSIS — M19012 Primary osteoarthritis, left shoulder: Secondary | ICD-10-CM | POA: Diagnosis not present

## 2020-11-19 DIAGNOSIS — C50212 Malignant neoplasm of upper-inner quadrant of left female breast: Secondary | ICD-10-CM | POA: Diagnosis not present

## 2020-11-25 ENCOUNTER — Other Ambulatory Visit: Payer: Self-pay | Admitting: General Surgery

## 2020-11-25 ENCOUNTER — Ambulatory Visit
Admission: RE | Admit: 2020-11-25 | Discharge: 2020-11-25 | Disposition: A | Discharge status: Home / Self Care | Payer: Medicare Other | Source: Ambulatory Visit | Attending: General Surgery | Admitting: General Surgery

## 2020-11-25 ENCOUNTER — Other Ambulatory Visit: Payer: Self-pay

## 2020-11-25 DIAGNOSIS — Z1231 Encounter for screening mammogram for malignant neoplasm of breast: Secondary | ICD-10-CM | POA: Insufficient documentation

## 2020-11-26 ENCOUNTER — Other Ambulatory Visit: Payer: Self-pay | Admitting: Dermatology

## 2020-12-05 DIAGNOSIS — C50212 Malignant neoplasm of upper-inner quadrant of left female breast: Secondary | ICD-10-CM | POA: Diagnosis not present

## 2020-12-18 DIAGNOSIS — C50212 Malignant neoplasm of upper-inner quadrant of left female breast: Secondary | ICD-10-CM | POA: Diagnosis not present

## 2021-01-03 ENCOUNTER — Other Ambulatory Visit: Payer: Self-pay | Admitting: Dermatology

## 2021-01-28 DIAGNOSIS — M5442 Lumbago with sciatica, left side: Secondary | ICD-10-CM | POA: Diagnosis not present

## 2021-01-28 DIAGNOSIS — M542 Cervicalgia: Secondary | ICD-10-CM | POA: Diagnosis not present

## 2021-01-28 DIAGNOSIS — M48062 Spinal stenosis, lumbar region with neurogenic claudication: Secondary | ICD-10-CM | POA: Diagnosis not present

## 2021-01-28 DIAGNOSIS — M25552 Pain in left hip: Secondary | ICD-10-CM | POA: Diagnosis not present

## 2021-02-07 DIAGNOSIS — M25552 Pain in left hip: Secondary | ICD-10-CM | POA: Diagnosis not present

## 2021-02-07 DIAGNOSIS — G8929 Other chronic pain: Secondary | ICD-10-CM | POA: Diagnosis not present

## 2021-02-13 DIAGNOSIS — M6281 Muscle weakness (generalized): Secondary | ICD-10-CM | POA: Diagnosis not present

## 2021-02-13 DIAGNOSIS — M542 Cervicalgia: Secondary | ICD-10-CM | POA: Diagnosis not present

## 2021-02-25 DIAGNOSIS — M25552 Pain in left hip: Secondary | ICD-10-CM | POA: Diagnosis not present

## 2021-02-25 DIAGNOSIS — M48062 Spinal stenosis, lumbar region with neurogenic claudication: Secondary | ICD-10-CM | POA: Diagnosis not present

## 2021-02-25 DIAGNOSIS — M5442 Lumbago with sciatica, left side: Secondary | ICD-10-CM | POA: Diagnosis not present

## 2021-02-25 DIAGNOSIS — M542 Cervicalgia: Secondary | ICD-10-CM | POA: Diagnosis not present

## 2021-04-30 ENCOUNTER — Telehealth: Payer: Self-pay | Admitting: Family Medicine

## 2021-04-30 DIAGNOSIS — Z66 Do not resuscitate: Secondary | ICD-10-CM

## 2021-04-30 NOTE — Telephone Encounter (Signed)
Spoke with patient about DNR order request. She confirms she wants to be DNR. Order filled and placed in Lisa's box to scan copy into chart and give 2 originals back to patient.  ?

## 2021-04-30 NOTE — Telephone Encounter (Signed)
Left a detailed message on voicemail (on DPR) that paperwork is up front ready for pickup. ?

## 2021-04-30 NOTE — Telephone Encounter (Signed)
Pt dropped off paper work ? ?Type of forms received:DNR ? ?Routed TR:RNHA ? ?Paperwork received by : terrill ? ? ?Individual made aware of 3-5 business day turn around (Y/N): ?y ?Form completed and patient made aware of charges(Y/N): ?y ? ?Faxed to :  ? ?Form location:  dr g box ?

## 2021-04-30 NOTE — Telephone Encounter (Signed)
Forms are in your basket

## 2021-05-15 ENCOUNTER — Telehealth: Payer: Self-pay

## 2021-05-15 NOTE — Progress Notes (Signed)
? ? ?Chronic Care Management ?Pharmacy Assistant  ? ?Name: Paige Medina  MRN: [id number] DOB: Sep 01, 1933 ? ?Reason for Encounter: CCM (Hypertension and Hyperlipidemia Disease State) ?  ?Recent office visits:  ?None since last CCM contact ? ?Recent consult visits:  ?02/25/2021 - [name], MD - Physical Medicine - In office visit. No changes.  ?11/25/2020 - Mammogram ? ?Rehabilitation Visits for left hip pain: ?01/28/2021 - 02/07/2021 ? ?Hospital visits:  ?None in previous 6 months ? ?Medications: ?Outpatient Encounter Medications as of 05/15/2021  ?Medication Sig  ? b complex vitamins capsule Take 1 capsule by mouth daily.  ? beta carotene w/minerals (OCUVITE) tablet Take 1 tablet by mouth every other day.  ? Blood Glucose Monitoring Suppl (ONE TOUCH ULTRA 2) w/Device KIT Use as directed  ? Cholecalciferol 125 MCG (5000 UT) TABS Take 1 tablet by mouth every other day.  ? clobetasol (TEMOVATE) 0.05 % external solution APPLY A SMALL AMOUNT TO AFFECTED AREA ONCE A DAY  ? Coenzyme Q10 (CO Q-10) 200 MG CAPS Take 200 mg by mouth every morning.   ? docusate sodium (COLACE) 100 MG capsule Take 100 mg by mouth 2 (two) times daily.  ? doxycycline (VIBRA-TABS) 100 MG tablet Take 1 tablet (100 mg total) by mouth daily. Takes 2 days on, 1 day off for acne  ? GENTEAL SEVERE 0.3 % GEL ophthalmic ointment   ? glucose blood (ONETOUCH ULTRA) test strip Use as instructed to check blood sugar once daily.  ? Ivermectin 1 % CREA APPLY EXTERNALLY TO THE AFFECTED AREA 3 TIMES A WEEK. LET SIT OVERNIGHT AND RINSE OUT IN MORNING  ? lovastatin (MEVACOR) 40 MG tablet TAKE 1 TABLET BY MOUTH EVERY DAY  ? metroNIDAZOLE (METROGEL) 0.75 % gel APPLY SMALL AMOUNT EXTERNALLY TO THE AFFECTED AREA 1 TO 2 TIMES DAILY AS DIRECTED  ? naproxen sodium (ALEVE) 220 MG tablet Take 220 mg by mouth 2 (two) times daily as needed (for back pain).  ? olmesartan (BENICAR) 40 MG tablet TAKE 1 TABLET BY MOUTH EVERY DAY  ? OneTouch Delica Lancets 48G MISC Use as  instructed to check blood sugar once daily.  ? Probiotic Product (PROBIOTIC DAILY PO) Take 1 capsule by mouth at bedtime.   ? triamcinolone cream (KENALOG) 0.1 % Apply 1 application topically 2 (two) times daily as needed.  ? vitamin C (ASCORBIC ACID) 500 MG tablet Take 500 mg by mouth daily.  ? ?No facility-administered encounter medications on file as of 05/15/2021.  ? ? ?Recent Office Vitals: ?BP Readings from Last 3 Encounters:  ?11/05/20 140/68  ?09/17/20 110/78  ?04/30/20 140/62  ? ?Pulse Readings from Last 3 Encounters:  ?11/05/20 70  ?09/17/20 (!) 59  ?04/30/20 67  ?  ?Wt Readings from Last 3 Encounters:  ?11/05/20 135 lb 6 oz (61.4 kg)  ?09/17/20 134 lb 2 oz (60.8 kg)  ?04/30/20 141 lb 7 oz (64.2 kg)  ?  ? ?Kidney Function ?Lab Results  ?Component Value Date/Time  ? CREATININE 0.98 10/29/2020 08:05 AM  ? CREATININE 1.11 10/25/2019 08:10 AM  ? CREATININE 1.09 (H) 10/05/2016 11:03 AM  ? CREATININE 1.10 03/15/2013 12:00 AM  ? GFR 52.64 (L) 10/29/2020 08:05 AM  ? GFRNONAA 48 (L) 08/10/2017 01:13 AM  ? GFRAA 55 (L) 08/10/2017 01:13 AM  ? ?BMP Latest Ref Rng & Units 10/29/2020 10/25/2019 04/26/2019  ?Glucose 70 - 99 mg/dL 104(H) 130(H) 114(H)  ?BUN 6 - 23 mg/dL 17 19 25(H)  ?Creatinine 0.40 - 1.20 mg/dL 0.98 1.11 1.15  ?  Sodium 135 - 145 mEq/L 142 143 138  ?Potassium 3.5 - 5.1 mEq/L 4.4 4.7 4.9  ?Chloride 96 - 112 mEq/L 105 105 103  ?CO2 19 - 32 mEq/L 28 31 28   ?Calcium 8.4 - 10.5 mg/dL 10.0 10.0 9.8  ? ?Lipid Panel: ?   ?Component Value Date/Time  ? CHOL 183 10/29/2020 0805  ? TRIG 118.0 10/29/2020 0805  ? TRIG 243 03/15/2013 0000  ? HDL 63.00 10/29/2020 0805  ? [place] 96 10/29/2020 0805  ? [place] 50 03/15/2013 0000  ? ?Contacted patient on 05/19/2021 to discuss hypertension disease state ? ?Current antihypertensive regimen:  ?Olmesartan 40 mg - take 1 tablet daily  ? Patient verbally confirms she is taking the above medications as directed. Yes ? ?How often are you checking your Blood Pressure? infrequently at  various times.  ? ?Date  Blood Pressure ?03/18  175/95 ? ?Patient was taking CBD Gummies - 300 mg - 2 in morning; 2 in afternoon/evening - last dose was 05/17/2021. Patient feels that could be raising her blood pressure. Patient has since stopped taking them. I asked patient to take her blood pressure daily and keep a log. I advised patient I would call her back on 05/23/2021 for her numbers. Patient understood and agreed.  ? ?Wrist or arm cuff: Arm cuff ?Caffeine intake: 1 cup decaf coffee  ?Salt intake: Does not add salt ?Over the counter medications including pseudoephedrine or NSAIDs? Tylenol (650 mg) - 1 in morning; 1 at night ?Patient was taking CBD Gummies - 300 mg - 2 in morning; 2 in afternoon/evening - last dose was 05/17/2021. ? ?Any readings above 180/120? No ? ?What recent interventions/DTPs have been made by any provider to improve Blood Pressure control and/or Cholesterol since last CPP Visit: No recent interventions ? ?Any recent hospitalizations or ED visits since last visit with CPP? No ? ?What diet changes have been made to improve Blood Pressure Control and/or Cholesterol? ?Patient stated she tries to eat healthy. She watches her salt and sugar.  ? ?What exercise is being done to improve your Blood Pressure Control and/or Cholesterol? ?Patient goes to [employer]; three times a week.   ? ?Contacted patient on 05/19/2021 to discuss hyperlipidemia. ?  ?10-year ASCVD risk score: The ASCVD Risk score (Arnett DK, et al., 2019) failed to calculate for the following reasons: ?  The 2019 ASCVD risk score is only valid for ages 60 to 64 ? ?Current antihyperlipidemic regimen:  ?No Pharmacotherapy ? ?ASCVD risk enhancing conditions: age >107, DM, HTN, CKD, and current smoker ? ?What recent interventions/DTPs have been made by any provider to improve Cholesterol control since last CPP Visit: No recent interventions ? ?Adherence Review: ?Does the patient have >5 day gap between last estimated fill dates?  No ? ?Annual wellness visit in last year? Yes 11/05/2020 ?Most Recent BP reading: 140/68 on 11/05/2020 ? ?If Diabetic: ?Most recent A1C reading: 6.1 on 10/29/2020 ?Last eye exam / retinopathy screening: Up to date ?Last diabetic foot exam: 04/30/2020 ? ?Upcoming appointments: ?No appointments scheduled within the next 30 days. ? ?Star Rating Drugs ?Medication Name/Dose: Last fill date Days supply ?Lovastatin 40 mg  03/29/2021 90 ?Olmesartan 40 mg  03/22/2021 90 ? ?Charlene Brooke, CPP notified ? ?Marijean Niemann, RMA ?Clinical Pharmacy Assistant ?[phone] ? ?   ? ? ? ? ? ? ?

## 2021-05-19 ENCOUNTER — Telehealth: Payer: Self-pay

## 2021-05-19 NOTE — Progress Notes (Signed)
? ? ?  Chronic Care Management ?Pharmacy Assistant  ? ?Name: Paige Medina  MRN: [id number] DOB: 1933-04-22 ? ?Reason for Encounter: CCM (Schedule Appointment) ?  ?Patient has been scheduled for a follow-up face to face appointment with Charlene Brooke on 05/27/2021. ? ?Charlene Brooke, CPP notified ? ?Marijean Niemann, RMA ?Clinical Pharmacy Assistant ?[phone] ? ? ?

## 2021-05-22 ENCOUNTER — Telehealth: Payer: Self-pay

## 2021-05-22 NOTE — Chronic Care Management (AMB) (Signed)
? ? ?  Chronic Care Management ?Pharmacy Assistant  ? ?Name: Paige Medina  MRN: [id number] DOB: January 06, 1934 ? ? ?Reason for Encounter: Reminder Call ?  ?Conditions to be addressed/monitored: ?HTN, HLD, DMII, and CKD Stage 3 ? ?Medications: ?Outpatient Encounter Medications as of 05/22/2021  ?Medication Sig  ? b complex vitamins capsule Take 1 capsule by mouth daily.  ? beta carotene w/minerals (OCUVITE) tablet Take 1 tablet by mouth every other day.  ? Blood Glucose Monitoring Suppl (ONE TOUCH ULTRA 2) w/Device KIT Use as directed  ? Cholecalciferol 125 MCG (5000 UT) TABS Take 1 tablet by mouth every other day.  ? clobetasol (TEMOVATE) 0.05 % external solution APPLY A SMALL AMOUNT TO AFFECTED AREA ONCE A DAY  ? Coenzyme Q10 (CO Q-10) 200 MG CAPS Take 200 mg by mouth every morning.   ? docusate sodium (COLACE) 100 MG capsule Take 100 mg by mouth 2 (two) times daily.  ? doxycycline (VIBRA-TABS) 100 MG tablet Take 1 tablet (100 mg total) by mouth daily. Takes 2 days on, 1 day off for acne  ? GENTEAL SEVERE 0.3 % GEL ophthalmic ointment   ? glucose blood (ONETOUCH ULTRA) test strip Use as instructed to check blood sugar once daily.  ? Ivermectin 1 % CREA APPLY EXTERNALLY TO THE AFFECTED AREA 3 TIMES A WEEK. LET SIT OVERNIGHT AND RINSE OUT IN MORNING  ? lovastatin (MEVACOR) 40 MG tablet TAKE 1 TABLET BY MOUTH EVERY DAY  ? metroNIDAZOLE (METROGEL) 0.75 % gel APPLY SMALL AMOUNT EXTERNALLY TO THE AFFECTED AREA 1 TO 2 TIMES DAILY AS DIRECTED  ? naproxen sodium (ALEVE) 220 MG tablet Take 220 mg by mouth 2 (two) times daily as needed (for back pain).  ? olmesartan (BENICAR) 40 MG tablet TAKE 1 TABLET BY MOUTH EVERY DAY  ? OneTouch Delica Lancets 56E MISC Use as instructed to check blood sugar once daily.  ? Probiotic Product (PROBIOTIC DAILY PO) Take 1 capsule by mouth at bedtime.   ? triamcinolone cream (KENALOG) 0.1 % Apply 1 application topically 2 (two) times daily as needed.  ? vitamin C (ASCORBIC ACID) 500 MG tablet Take  500 mg by mouth daily.  ? ?No facility-administered encounter medications on file as of 05/22/2021.  ? ? ?Paige Medina was contacted to remind of upcoming office visit with Paige Medina on 05/27/21 at 8:45am. Patient was reminded to have any blood glucose and blood pressure readings available for review at appointment.  ? ?Patient confirmed appointment. ? ? ?Are you having any problems with your medications? No  ? ?Do you have any concerns you like to discuss with the pharmacist?  Concerns of taking CBD supplements ? ? ? ?Star Rating Drugs: ?Medication:  Last Fill: Day Supply ?Lovastatin 40mg  03/29/21 90 ?Olmesartan 40mg  03/22/21 90 ? ? ?Paige Medina, CPP notified ? ?Paige Medina, CCMA ?Health concierge  ?[phone]  ?

## 2021-05-27 ENCOUNTER — Ambulatory Visit (INDEPENDENT_AMBULATORY_CARE_PROVIDER_SITE_OTHER): Payer: Medicare Other | Admitting: Pharmacist

## 2021-05-27 ENCOUNTER — Other Ambulatory Visit: Payer: Self-pay

## 2021-05-27 DIAGNOSIS — I10 Essential (primary) hypertension: Secondary | ICD-10-CM

## 2021-05-27 DIAGNOSIS — E1169 Type 2 diabetes mellitus with other specified complication: Secondary | ICD-10-CM

## 2021-05-27 NOTE — Patient Instructions (Signed)
Visit Information ? ?Phone number for Pharmacist: [phone] ? ? Goals Addressed   ? ?  ?  ?  ?  ? This Visit's Progress  ?  Track and Manage My Blood Pressure-Hypertension     ?  Timeframe:  Long-Range Goal ?Priority:  Medium ?Start Date:  05/27/21                           ?Expected End Date:     05/28/22                 ? ?Follow Up Date April 2023 ?  ?- check blood pressure daily ?- choose a place to take my blood pressure (home, clinic or office, retail store) ?- write blood pressure results in a log or diary  ?  ?Why is this important?   ?You won't feel high blood pressure, but it can still hurt your blood vessels.  ?High blood pressure can cause heart or kidney problems. It can also cause a stroke.  ?Making lifestyle changes like losing a little weight or eating less salt will help.  ?Checking your blood pressure at home and at different times of the day can help to control blood pressure.  ?If the doctor prescribes medicine remember to take it the way the doctor ordered.  ?Call the office if you cannot afford the medicine or if there are questions about it.   ?  ?Notes:  ?  ? ?  ? ? ?Care Plan : [place]  ?Updates made by Charlton Haws, [place] since 05/27/2021 12:00 AM  ?  ? ?Problem: Hypertension and Hyperlipidemia   ?Priority: High  ?  ? ?Long-Range Goal: Disease mgmt   ?Start Date: 05/27/2021  ?Expected End Date: 05/28/2022  ?This Visit's Progress: On track  ?Priority: High  ?Note:   ?Current Barriers:  ?Unable to maintain control of BP ? ?Pharmacist Clinical Goal(s):  ?Patient will adhere to plan to optimize therapeutic regimen for BP as evidenced by report of adherence to recommended medication management changes through collaboration with PharmD and provider.  ? ?Interventions: ?1:1 collaboration with [name], MD regarding development and update of comprehensive plan of care as evidenced by provider attestation and co-signature ?Inter-disciplinary care team collaboration (see  longitudinal plan of care) ?Comprehensive medication review performed; medication list updated in electronic medical record ? ?Hypertension (BP goal <130/80) ?-Not ideally controlled - home BP has been elevated; pt endorses increased stress lately due to moving, describes herself as "Type A"; she also reports pain due to arthritis in her shoulder and knee - she denies taking NSAIDs  ?-Current home readings: ? Date: AM  HS ? 3/18 175/95 ? 3/20   144/80 ? 3/21 146/87  184/87 ? 3/22 124/68  151/82 ? 3/23 134/73  135/69 ? 3/24 143/68  170/76 ? 3/25 152/68  176/97 ? 3/26 163/79  151/74 ? 3/27 152/76  163/86 ?-Current treatment: ?Olmesartan 40 mg daily AM - Appropriate, Query Effective ?-Medications previously tried: amlodipine (low BP), HCTZ, losartan, metoprolol ?-Current dietary habits: limits salt ?-Current exercise habits: 3x weekly ?-Educated on BP goals and benefits of medications for prevention of heart attack, stroke and kidney damage; Importance of home blood pressure monitoring; proper BP monitoring technique; ?-Discussed treatment options - pt is reluctant to start a new medication today, she reports "I will never take amlodipine again" but may be open to HCTZ in future; for now we will conservatively change timing of  olmesartan to bedtime to maximize BP lowering effect and keep tracking BP  ?-Counseled to monitor BP at home daily, document, and provide log at future appointments ? ?Hyperlipidemia: (LDL goal < 100) ?-Controlled - LDL 96 (09/2020) at [facility]; pt endorses compliance with lovastatin; pt believes lovastatin is causing joint pain ?-Current treatment: ?Lovastatin 40 mg daily - Appropriate, Effective, Query Safe ?Coenzyme Q10 - Appropriate, Effective, Safe, Accessible ?-Medications previously tried: simvastatin, atorvastatin (myalgias) ?-Educated on Cholesterol goals; Benefits of statin for ASCVD risk reduction; Strategies to manage statin-induced myalgias; ?-Recommended to continue current medication;  consider changing to hydrophillic statin (prava, rosuva) in future if desired ? ?Patient Goals/Self-Care Activities ?Patient will:  ?- take medications as prescribed as evidenced by patient report and record review ?focus on medication adherence by routine ?check blood pressure daily, document, and provide at future appointments ? ?  ?  ? ?Patient verbalizes understanding of instructions and care plan provided today and agrees to view in [place]. Active MyChart status confirmed with patient.   ?Telephone follow up appointment with pharmacy team member scheduled for: 1 month ? ?Charlene Brooke, PharmD, BCACP ?Clinical Pharmacist ?[facility] ?[phone] ?  ?

## 2021-05-27 NOTE — Progress Notes (Signed)
? ?Chronic Care Management ?Pharmacy Note ? ?05/27/2021 ?Name:  Paige Medina MRN:  [id number] DOB:  [birth year] ? ?Summary: CCM F/U visit ?-BP has been elevated at home. Patient is taking olmesartan in AM and is not currently willing to add another medication, may be open to HCTZ in future. She does report increased stress lately due to moving which should settle down shortly. ?-Home BP readings: ? Date: AM  HS ? 3/18 175/95 ? 3/20   144/80 ? 3/21 146/87  184/87 ? 3/22 124/68  151/82 ? 3/23 134/73  135/69 ? 3/24 143/68  170/76 ? 3/25 152/68  176/97 ? 3/26 163/79  151/74 ? 3/27 152/76  163/86 ? ?Recommendations/Changes made from today's visit: ?-Move Olmesartan to bedtime. Keep checking BP twice daily. Consider adding HCTZ at follow up. ? ?Plan: ?-Paige Medina will call patient 2 weeks for BP log ?-Pharmacist follow up televisit scheduled for 1 month ?-PCP CPE 11/07/21 ? ? ? ?Subjective: ?Paige Medina is an 88 y.o. year old female who is a primary patient of [name], MD.  The CCM team was consulted for assistance with disease management and care coordination needs.   ? ?Engaged with patient face to face for follow up visit in response to provider referral for pharmacy case management and/or care coordination services.  ? ?Consent to Services:  ?The patient was given information about Chronic Care Management services, agreed to services, and gave verbal consent prior to initiation of services.  Please see initial visit note for detailed documentation.  ? ?Patient Care Team: ?[name], MD as PCP - General (Family Medicine) ?Wellington Hampshire, MD as PCP - Cardiology (Cardiology) ?Christene Lye, MD (General Surgery) ?Ralene Bathe, MD as Referring Physician (Dermatology) ?Rodney Langton., MD as Referring Physician (Dentistry) ?[name], MD as Referring Physician (Ophthalmology) ?Donnamae Jude, MD as Consulting Physician (Obstetrics and Gynecology) ?Jovita Kussmaul, MD as Consulting Physician (General Surgery) ?Delisha Peaden, Cleaster Corin, [facility] as Pharmacist (Pharmacist) ? ?Recent office visits: ?11/05/20 Dr Danise Mina V: AWV; no changes. ? ?Recent consult visits: ?N/a ? ?Hospital visits: ?None in previous 6 months ? ? ?Objective: ? ?Lab Results  ?Component Value Date  ? CREATININE 0.98 10/29/2020  ? BUN 17 10/29/2020  ? GFR 52.64 (L) 10/29/2020  ? GFRNONAA 48 (L) 08/10/2017  ? GFRAA 55 (L) 08/10/2017  ? NA 142 10/29/2020  ? K 4.4 10/29/2020  ? CALCIUM 10.0 10/29/2020  ? CO2 28 10/29/2020  ? GLUCOSE 104 (H) 10/29/2020  ? ? ?Lab Results  ?Component Value Date/Time  ? HGBA1C 6.1 10/29/2020 08:05 AM  ? HGBA1C 6.3 (A) 04/30/2020 03:04 PM  ? HGBA1C 6.4 10/25/2019 08:10 AM  ? GFR 52.64 (L) 10/29/2020 08:05 AM  ? GFR 46.77 (L) 10/25/2019 08:10 AM  ? MICROALBUR <0.7 10/29/2020 08:05 AM  ? MICROALBUR 3.2 (H) 10/12/2017 08:35 AM  ?  ?Last diabetic Eye exam:  ?Lab Results  ?Component Value Date/Time  ? HMDIABEYEEXA No Retinopathy 08/09/2020 12:00 AM  ?  ?Last diabetic Foot exam: No results found for: HMDIABFOOTEX  ? ?Lab Results  ?Component Value Date  ? CHOL 183 10/29/2020  ? HDL 63.00 10/29/2020  ? [place] 96 10/29/2020  ? TRIG 118.0 10/29/2020  ? CHOLHDL 3 10/29/2020  ? ? ? ?  Latest Ref Rng & Units 10/29/2020  ?  8:05 AM 10/25/2019  ?  8:10 AM 04/26/2019  ? 12:29 PM  ?Hepatic Function  ?Total Protein 6.0 - 8.3 g/dL 7.2  7.0     ?Albumin 3.5 - 5.2 g/dL 4.2   4.2   4.1    ?AST 0 - 37 U/L 21   16     ?ALT 0 - 35 U/L 19   18     ?Alk Phosphatase 39 - 117 U/L 73   75     ?Total Bilirubin 0.2 - 1.2 mg/dL 0.7   0.6     ? ? ?Lab Results  ?Component Value Date/Time  ? TSH 2.800 02/08/2020 01:06 PM  ? TSH 2.07 03/17/2016 10:56 AM  ? FREET4 0.73 03/17/2016 10:56 AM  ? ? ? ?  Latest Ref Rng & Units 10/29/2020  ?  8:05 AM 10/25/2019  ?  8:10 AM 10/14/2018  ?  8:03 AM  ?CBC  ?WBC 4.0 - 10.5 K/uL 6.9   6.8   8.9    ?Hemoglobin 12.0 - 15.0 g/dL 13.8   13.9   13.9    ?Hematocrit 36.0 - 46.0 % 41.9   42.0   42.2     ?Platelets 150.0 - 400.0 K/uL 238.0   236.0   236.0    ? ? ?Lab Results  ?Component Value Date/Time  ? VD25OH 35.34 10/29/2020 08:05 AM  ? VD25OH 35.44 10/25/2019 08:10 AM  ? ? ?Clinical ASCVD: No  ?The ASCVD Risk score (Arnett DK, et al., 2019) failed to calculate for the following reasons: ?  The 2019 ASCVD risk score is only valid for ages 86 to 25   ? ? ?  11/05/2020  ?  9:00 AM 11/01/2019  ?  3:04 PM 10/21/2018  ?  9:37 AM  ?Depression screen PHQ 2/9  ?Decreased Interest 0 0 0  ?Down, Depressed, Hopeless 0 0 0  ?PHQ - 2 Score 0 0 0  ?  ? ? ?Social History  ? ?Tobacco Use  ?Smoking Status Never  ?Smokeless Tobacco Never  ?Tobacco Comments  ? + second hand smoker exposure  ? ?BP Readings from Last 3 Encounters:  ?11/05/20 140/68  ?09/17/20 110/78  ?04/30/20 140/62  ? ?Pulse Readings from Last 3 Encounters:  ?11/05/20 70  ?09/17/20 (!) 59  ?04/30/20 67  ? ?Wt Readings from Last 3 Encounters:  ?11/05/20 135 lb 6 oz (61.4 kg)  ?09/17/20 134 lb 2 oz (60.8 kg)  ?04/30/20 141 lb 7 oz (64.2 kg)  ? ?BMI Readings from Last 3 Encounters:  ?11/05/20 22.53 kg/m?  ?09/17/20 21.98 kg/m?  ?04/30/20 22.83 kg/m?  ? ? ?Assessment/Interventions: Review of patient past medical history, allergies, medications, health status, including review of consultants reports, laboratory and other test data, was performed as part of comprehensive evaluation and provision of chronic care management services.  ? ?SDOH:  (Social Determinants of Health) assessments and interventions performed: Yes ?SDOH Interventions   ? ?Flowsheet Row Most Recent Value  ?SDOH Interventions   ?Financial Strain Interventions Intervention Not Indicated  ?Transportation Interventions Intervention Not Indicated  ? ?  ? ?SDOH Screenings  ? ?Alcohol Screen: Not on file  ?Depression (PHQ2-9): Low Risk   ? PHQ-2 Score: 0  ?Financial Resource Strain: Low Risk   ? Difficulty of Paying Living Expenses: Not very hard  ?Food Insecurity: Not on file  ?Housing: Not on file  ?Physical  Activity: Not on file  ?Social Connections: Not on file  ?Stress: Not on file  ?Tobacco Use: Low Risk   ? Smoking Tobacco Use: Never  ? Smokeless Tobacco Use: Never  ? Passive Exposure: Not on file  ?Transportation Needs: No  Transportation Needs  ? Lack of Transportation (Medical): No  ? Lack of Transportation (Non-Medical): No  ? ? ?CCM Care Plan ? ?Allergies  ?Allergen Reactions  ? Lidocaine Itching, Swelling and Other (See Comments)  ?  Blisters, tetracaine okay for epidural injections  ? Lipitor [Atorvastatin] Other (See Comments)  ?  myalgias  ? Latex Other (See Comments)  ?  Blisters  ? Ciprofloxacin Nausea Only  ? Oxycodone Nausea Only and Other (See Comments)  ?  constipation  ? Tape Other (See Comments)  ?  Blisters--(Paper or cloth tape okay)  ? Tegaderm Ag Mesh [Silver] Rash  ? Tramadol Nausea Only and Other (See Comments)  ?  constipation  ? ? ?Medications Reviewed Today   ? ? Reviewed by Charlton Haws, [facility] (Pharmacist) on 05/27/21 at [phone]  Med List Status: <None>  ? ?Medication Order Taking? Sig Documenting Provider Last Dose Status Informant  ?b complex vitamins capsule [id number] Yes Take 1 capsule by mouth daily. [provider] Taking Active   ?beta carotene w/minerals (OCUVITE) tablet [id number] Yes Take 1 tablet by mouth every other day. [provider] Taking Active Self  ?Blood Glucose Monitoring Suppl (ONE TOUCH ULTRA 2) w/Device KIT [id number] Yes Use as directed Lucille Passy, MD Taking Active Self  ?Cholecalciferol 125 MCG (5000 UT) TABS [id number] Yes Take 1 tablet by mouth every other day. [provider] Taking Active Self  ?clobetasol (TEMOVATE) 0.05 % external solution [id number] Yes APPLY A SMALL AMOUNT TO AFFECTED AREA ONCE A DAY Ralene Bathe, MD Taking Active   ?Coenzyme Q10 (CO Q-10) 200 MG CAPS [id number] Yes Take 200 mg by mouth every morning.  [provider] Taking Active Self  ?docusate sodium (COLACE) 100 MG capsule [id number] Yes Take  100 mg by mouth 2 (two) times daily. [provider] Taking Active Self  ?         ?Med Note Wannetta Sender Feb 08, 2020 11:37 AM)    ?doxycycline (VIBRA-TABS) 100 MG tablet [id number]

## 2021-05-30 DIAGNOSIS — I10 Essential (primary) hypertension: Secondary | ICD-10-CM | POA: Diagnosis not present

## 2021-05-30 DIAGNOSIS — E785 Hyperlipidemia, unspecified: Secondary | ICD-10-CM

## 2021-06-12 DIAGNOSIS — M25552 Pain in left hip: Secondary | ICD-10-CM | POA: Diagnosis not present

## 2021-06-12 DIAGNOSIS — G8929 Other chronic pain: Secondary | ICD-10-CM | POA: Diagnosis not present

## 2021-06-17 ENCOUNTER — Other Ambulatory Visit: Payer: Self-pay | Admitting: Cardiovascular Disease

## 2021-06-18 DIAGNOSIS — M7582 Other shoulder lesions, left shoulder: Secondary | ICD-10-CM | POA: Diagnosis not present

## 2021-06-18 DIAGNOSIS — M19011 Primary osteoarthritis, right shoulder: Secondary | ICD-10-CM | POA: Diagnosis not present

## 2021-06-18 DIAGNOSIS — M25811 Other specified joint disorders, right shoulder: Secondary | ICD-10-CM | POA: Diagnosis not present

## 2021-06-18 DIAGNOSIS — M19012 Primary osteoarthritis, left shoulder: Secondary | ICD-10-CM | POA: Diagnosis not present

## 2021-06-18 DIAGNOSIS — M25511 Pain in right shoulder: Secondary | ICD-10-CM | POA: Diagnosis not present

## 2021-06-18 DIAGNOSIS — M7581 Other shoulder lesions, right shoulder: Secondary | ICD-10-CM | POA: Diagnosis not present

## 2021-06-20 ENCOUNTER — Telehealth: Payer: Self-pay

## 2021-06-20 NOTE — Progress Notes (Signed)
? ? ?  Chronic Care Management ?Pharmacy Assistant  ? ?Name: Paige Medina  MRN: [id number] DOB: 10-26-1933 ? ?Reason for Encounter: CCM Counsellor) ?  ?Medications: ?Outpatient Encounter Medications as of 06/20/2021  ?Medication Sig  ? b complex vitamins capsule Take 1 capsule by mouth daily.  ? beta carotene w/minerals (OCUVITE) tablet Take 1 tablet by mouth every other day.  ? Blood Glucose Monitoring Suppl (ONE TOUCH ULTRA 2) w/Device KIT Use as directed  ? Cholecalciferol 125 MCG (5000 UT) TABS Take 1 tablet by mouth every other day.  ? clobetasol (TEMOVATE) 0.05 % external solution APPLY A SMALL AMOUNT TO AFFECTED AREA ONCE A DAY  ? Coenzyme Q10 (CO Q-10) 200 MG CAPS Take 200 mg by mouth every morning.   ? docusate sodium (COLACE) 100 MG capsule Take 100 mg by mouth 2 (two) times daily.  ? doxycycline (VIBRA-TABS) 100 MG tablet Take 1 tablet (100 mg total) by mouth daily. Takes 2 days on, 1 day off for acne  ? GENTEAL SEVERE 0.3 % GEL ophthalmic ointment   ? glucose blood (ONETOUCH ULTRA) test strip Use as instructed to check blood sugar once daily.  ? Ivermectin 1 % CREA APPLY EXTERNALLY TO THE AFFECTED AREA 3 TIMES A WEEK. LET SIT OVERNIGHT AND RINSE OUT IN MORNING (Patient not taking: Reported on 05/27/2021)  ? lovastatin (MEVACOR) 40 MG tablet TAKE 1 TABLET BY MOUTH EVERY DAY  ? metroNIDAZOLE (METROGEL) 0.75 % gel APPLY SMALL AMOUNT EXTERNALLY TO THE AFFECTED AREA 1 TO 2 TIMES DAILY AS DIRECTED  ? olmesartan (BENICAR) 40 MG tablet TAKE 1 TABLET BY MOUTH EVERY DAY  ? OneTouch Delica Lancets 36I MISC Use as instructed to check blood sugar once daily.  ? Probiotic Product (PROBIOTIC DAILY PO) Take 1 capsule by mouth at bedtime.   ? triamcinolone cream (KENALOG) 0.1 % Apply 1 application topically 2 (two) times daily as needed.  ? vitamin C (ASCORBIC ACID) 500 MG tablet Take 500 mg by mouth daily.  ? ?No facility-administered encounter medications on file as of 06/20/2021.  ? ?Paige Medina was  contacted to remind of upcoming telephone visit with Charlene Brooke on 06/25/2021 at 11:00. Patient was reminded to have any blood glucose and blood pressure readings available for review at appointment.  ? ?Message was left reminding patient of appointment. ? ?Star Rating Drugs: ?Medication:  Last Fill: Day Supply ?Olmesartan 40 mg 06/17/2021 90 ?Lovastatin 40 mg 06/17/2021 90 ? ?Charlene Brooke, CPP notified ? ?Marijean Niemann, RMA ?Clinical Pharmacy Assistant ?[phone] ? ? ? ?

## 2021-06-25 ENCOUNTER — Ambulatory Visit (INDEPENDENT_AMBULATORY_CARE_PROVIDER_SITE_OTHER): Payer: PPO | Admitting: Pharmacist

## 2021-06-25 DIAGNOSIS — E785 Hyperlipidemia, unspecified: Secondary | ICD-10-CM

## 2021-06-25 DIAGNOSIS — I10 Essential (primary) hypertension: Secondary | ICD-10-CM

## 2021-06-25 NOTE — Progress Notes (Signed)
? ?Chronic Care Management ?Pharmacy Note ? ?06/25/2021 ?Name:  Paige Medina MRN:  [id number] DOB:  September 08, 1933 ? ?Summary: CCM F/U visit ?-BP much improved over the last few weeks - 120/77 today, on average < 120/80 per patient report. ? ?Recommendations/Changes made from today's visit: ?-No med changes ? ?Plan: ?-[place] will call patient 7 months for BP update ?-Pharmacist follow up televisit scheduled for 1 year ?-PCP CPE 11/07/21 ? ? ? ?Subjective: ?Paige Medina is an 88 y.o. year old female who is a primary patient of [name], MD.  The CCM team was consulted for assistance with disease management and care coordination needs.   ? ?Engaged with patient face to face for follow up visit in response to provider referral for pharmacy case management and/or care coordination services.  ? ?Consent to Services:  ?The patient was given information about Chronic Care Management services, agreed to services, and gave verbal consent prior to initiation of services.  Please see initial visit note for detailed documentation.  ? ?Patient Care Team: ?[name], MD as PCP - General (Family Medicine) ?Wellington Hampshire, MD as PCP - Cardiology (Cardiology) ?Christene Lye, MD (General Surgery) ?Ralene Bathe, MD as Referring Physician (Dermatology) ?Rodney Langton., MD as Referring Physician (Dentistry) ?[name], MD as Referring Physician (Ophthalmology) ?Donnamae Jude, MD as Consulting Physician (Obstetrics and Gynecology) ?Jovita Kussmaul, MD as Consulting Physician (General Surgery) ?Makayle Krahn, Cle[facility] [place], [facility] as Pharmacist (Pharmacist) ? ?Recent office visits: ?11/05/20 Dr Danise Mina V: AWV; no changes. ? ?Recent consult visits: ?06/18/21 Dr Mikle Bosworth (ortho): initial consult - R rotator cuff tendonitis. Bilateral steroid injections given. Can repeat 3 mos.  ? ?Hospital visits: ?None in previous 6 months ? ? ?Objective: ? ?Lab Results  ?Component Value Date  ? CREATININE  0.98 10/29/2020  ? BUN 17 10/29/2020  ? GFR 52.64 (L) 10/29/2020  ? GFRNONAA 48 (L) 08/10/2017  ? GFRAA 55 (L) 08/10/2017  ? NA 142 10/29/2020  ? K 4.4 10/29/2020  ? CALCIUM 10.0 10/29/2020  ? CO2 28 10/29/2020  ? GLUCOSE 104 (H) 10/29/2020  ? ? ?Lab Results  ?Component Value Date/Time  ? HGBA1C 6.1 10/29/2020 08:05 AM  ? HGBA1C 6.3 (A) 04/30/2020 03:04 PM  ? HGBA1C 6.4 10/25/2019 08:10 AM  ? GFR 52.64 (L) 10/29/2020 08:05 AM  ? GFR 46.77 (L) 10/25/2019 08:10 AM  ? MICROALBUR <0.7 10/29/2020 08:05 AM  ? MICROALBUR 3.2 (H) 10/12/2017 08:35 AM  ?  ?Last diabetic Eye exam:  ?Lab Results  ?Component Value Date/Time  ? HMDIABEYEEXA No Retinopathy 08/09/2020 12:00 AM  ?  ?Last diabetic Foot exam: No results found for: HMDIABFOOTEX  ? ?Lab Results  ?Component Value Date  ? CHOL 183 10/29/2020  ? HDL 63.00 10/29/2020  ? [place] 96 10/29/2020  ? TRIG 118.0 10/29/2020  ? CHOLHDL 3 10/29/2020  ? ? ? ?  Latest Ref Rng & Units 10/29/2020  ?  8:05 AM 10/25/2019  ?  8:10 AM 04/26/2019  ? 12:29 PM  ?Hepatic Function  ?Total Protein 6.0 - 8.3 g/dL 7.2   7.0     ?Albumin 3.5 - 5.2 g/dL 4.2   4.2   4.1    ?AST 0 - 37 U/L 21   16     ?ALT 0 - 35 U/L 19   18     ?Alk Phosphatase 39 - 117 U/L 73   75     ?Total Bilirubin 0.2 - 1.2 mg/dL 0.7  0.6     ? ? ?Lab Results  ?Component Value Date/Time  ? TSH 2.800 02/08/2020 01:06 PM  ? TSH 2.07 03/17/2016 10:56 AM  ? FREET4 0.73 03/17/2016 10:56 AM  ? ? ? ?  Latest Ref Rng & Units 10/29/2020  ?  8:05 AM 10/25/2019  ?  8:10 AM 10/14/2018  ?  8:03 AM  ?CBC  ?WBC 4.0 - 10.5 K/uL 6.9   6.8   8.9    ?Hemoglobin 12.0 - 15.0 g/dL 13.8   13.9   13.9    ?Hematocrit 36.0 - 46.0 % 41.9   42.0   42.2    ?Platelets 150.0 - 400.0 K/uL 238.0   236.0   236.0    ? ? ?Lab Results  ?Component Value Date/Time  ? VD25OH 35.34 10/29/2020 08:05 AM  ? VD25OH 35.44 10/25/2019 08:10 AM  ? ? ?Clinical ASCVD: No  ?The ASCVD Risk score (Arnett DK, et al., 2019) failed to calculate for the following reasons: ?  The 2019 ASCVD  risk score is only valid for ages 19 to 2   ? ? ?  11/05/2020  ?  9:00 AM 11/01/2019  ?  3:04 PM 10/21/2018  ?  9:37 AM  ?Depression screen PHQ 2/9  ?Decreased Interest 0 0 0  ?Down, Depressed, Hopeless 0 0 0  ?PHQ - 2 Score 0 0 0  ?  ? ? ?Social History  ? ?Tobacco Use  ?Smoking Status Never  ?Smokeless Tobacco Never  ?Tobacco Comments  ? + second hand smoker exposure  ? ?BP Readings from Last 3 Encounters:  ?11/05/20 140/68  ?09/17/20 110/78  ?04/30/20 140/62  ? ?Pulse Readings from Last 3 Encounters:  ?11/05/20 70  ?09/17/20 (!) 59  ?04/30/20 67  ? ?Wt Readings from Last 3 Encounters:  ?11/05/20 135 lb 6 oz (61.4 kg)  ?09/17/20 134 lb 2 oz (60.8 kg)  ?04/30/20 141 lb 7 oz (64.2 kg)  ? ?BMI Readings from Last 3 Encounters:  ?11/05/20 22.53 kg/m?  ?09/17/20 21.98 kg/m?  ?04/30/20 22.83 kg/m?  ? ? ?Assessment/Interventions: Review of patient past medical history, allergies, medications, health status, including review of consultants reports, laboratory and other test data, was performed as part of comprehensive evaluation and provision of chronic care management services.  ? ?SDOH:  (Social Determinants of Health) assessments and interventions performed: No - done Mar 2023 ? ? ?SDOH Screenings  ? ?Alcohol Screen: Not on file  ?Depression (PHQ2-9): Low Risk   ? PHQ-2 Score: 0  ?Financial Resource Strain: Low Risk   ? Difficulty of Paying Living Expenses: Not very hard  ?Food Insecurity: Not on file  ?Housing: Not on file  ?Physical Activity: Not on file  ?Social Connections: Not on file  ?Stress: Not on file  ?Tobacco Use: Low Risk   ? Smoking Tobacco Use: Never  ? Smokeless Tobacco Use: Never  ? Passive Exposure: Not on file  ?Transportation Needs: No Transportation Needs  ? Lack of Transportation (Medical): No  ? Lack of Transportation (Non-Medical): No  ? ? ?CCM Care Plan ? ?Allergies  ?Allergen Reactions  ? Lidocaine Itching, Swelling and Other (See Comments)  ?  Blisters, tetracaine okay for epidural injections  ?  Lipitor [Atorvastatin] Other (See Comments)  ?  myalgias  ? Latex Other (See Comments)  ?  Blisters  ? Ciprofloxacin Nausea Only  ? Oxycodone Nausea Only and Other (See Comments)  ?  constipation  ? Tape Other (See Comments)  ?  Blisters--(Paper or  cloth tape okay)  ? Tegaderm Ag Mesh [Silver] Rash  ? Tramadol Nausea Only and Other (See Comments)  ?  constipation  ? ? ?Medications Reviewed Today   ? ? Reviewed by Charlton Haws, RPH (Pharmacist) on 06/25/21 at 1114  Med List Status: <None>  ? ?Medication Order Taking? Sig Documenting Provider Last Dose Status Informant  ?b complex vitamins capsule [id number] Yes Take 1 capsule by mouth daily. [provider] Taking Active   ?beta carotene w/minerals (OCUVITE) tablet [id number] Yes Take 1 tablet by mouth every other day. [provider] Taking Active Self  ?Blood Glucose Monitoring Suppl (ONE TOUCH ULTRA 2) w/Device KIT [id number] Yes Use as directed Lucille Passy, MD Taking Active Self  ?Cholecalciferol 125 MCG (5000 UT) TABS [id number] Yes Take 1 tablet by mouth every other day. [provider] Taking Active Self  ?clobetasol (TEMOVATE) 0.05 % external solution [id number] Yes APPLY A SMALL AMOUNT TO AFFECTED AREA ONCE A DAY Ralene Bathe, MD Taking Active   ?Coenzyme Q10 (CO Q-10) 200 MG CAPS [id number] Yes Take 200 mg by mouth every morning.  [provider] Taking Active Self  ?docusate sodium (COLACE) 100 MG capsule [id number] Yes Take 100 mg by mouth 2 (two) times daily. [provider] Taking Active Self  ?         ?Med Note Wannetta Sender Feb 08, 2020 11:37 AM)    ?doxycycline (VIBRA-TABS) 100 MG tablet [id number] Yes Take 1 tablet (100 mg total) by mouth daily. Takes 2 days on, 1 day off for acne Ralene Bathe, MD Taking Active   ?GENTEAL SEVERE 0.3 % GEL ophthalmic ointment [id number] Yes  [provider] Taking Active   ?glucose blood (ONETOUCH ULTRA) test strip [id number] Yes Use as  instructed to check blood sugar once daily. [name], MD Taking Active   ?Ivermectin 1 % CREA [id number] Yes APPLY EXTERNALLY TO THE AFFECTED AREA 3 TIMES A WEEK. LET SIT OVERNIGHT AND RINSE OUT IN [facility]

## 2021-06-25 NOTE — Patient Instructions (Signed)
Visit Information ? ?Phone number for Pharmacist: [phone] ? ? Goals Addressed   ?None ?  ? ? ?Care Plan : [place]  ?Updates made by Charlton Haws, [place] since 06/25/2021 12:00 AM  ?  ? ?Problem: Hypertension and Hyperlipidemia   ?Priority: High  ?  ? ?Long-Range Goal: Disease mgmt   ?Start Date: 05/27/2021  ?Expected End Date: 05/28/2022  ?This Visit's Progress: On track  ?Recent Progress: On track  ?Priority: High  ?Note:   ?Current Barriers:  ?None identified ? ?Pharmacist Clinical Goal(s):  ?Patient will contact provider office for questions/concerns as evidenced notation of same in electronic health record through collaboration with PharmD and provider.  ? ?Interventions: ?1:1 collaboration with [name], MD regarding development and update of comprehensive plan of care as evidenced by provider attestation and co-signature ?Inter-disciplinary care team collaboration (see longitudinal plan of care) ?Comprehensive medication review performed; medication list updated in electronic medical record ? ?Hypertension (BP goal <130/80) ?-Controlled - home BP improved after moving olmesartan to bedtime, finished moving w/ accompanying stress ?-Current home readings: range last few weeks 106/55 - 165/75; average < 120/70; today 120/70 ?-Current treatment: ?Olmesartan 40 mg daily PM - Appropriate, Effective, Safe, Accessible ?-Medications previously tried: amlodipine (low BP), HCTZ, losartan, metoprolol ?-Current dietary habits: limits salt ?-Current exercise habits: 3x weekly ?-Educated on BP goals and benefits of medications for prevention of heart attack, stroke and kidney damage; Importance of home blood pressure monitoring; proper BP monitoring technique; ?-Counseled to monitor BP at home daily ?-Recommend to continue current medicatio ? ?Hyperlipidemia: (LDL goal < 100) ?-Controlled - LDL 96 (09/2020) at goal; pt endorses compliance with lovastatin; pt believes lovastatin is causing joint  pain ?-Current treatment: ?Lovastatin 40 mg daily - Appropriate, Effective, Query Safe ?Coenzyme Q10 - Appropriate, Effective, Safe, Accessible ?-Medications previously tried: simvastatin, atorvastatin (myalgias) ?-Educated on Cholesterol goals; Benefits of statin for ASCVD risk reduction; Strategies to manage statin-induced myalgias; ?-Recommended to continue current medication; consider changing to hydrophillic statin (prava, rosuva) in future if desired ? ?Patient Goals/Self-Care Activities ?Patient will:  ?- take medications as prescribed as evidenced by patient report and record review ?focus on medication adherence by routine ?check blood pressure daily, document, and provide at future appointments ? ?  ?  ? ?Patient verbalizes understanding of instructions and care plan provided today and agrees to view in [place]. Active MyChart status confirmed with patient.   ?Telephone follow up appointment with pharmacy team member scheduled for: 1 year ? ?Charlene Brooke, PharmD, BCACP ?Clinical Pharmacist ?[facility] ?[phone] ?  ?

## 2021-06-26 ENCOUNTER — Ambulatory Visit: Payer: PPO | Admitting: Dermatology

## 2021-06-26 DIAGNOSIS — L603 Nail dystrophy: Secondary | ICD-10-CM | POA: Diagnosis not present

## 2021-06-26 DIAGNOSIS — L503 Dermatographic urticaria: Secondary | ICD-10-CM | POA: Diagnosis not present

## 2021-06-26 DIAGNOSIS — L820 Inflamed seborrheic keratosis: Secondary | ICD-10-CM | POA: Diagnosis not present

## 2021-06-26 DIAGNOSIS — L578 Other skin changes due to chronic exposure to nonionizing radiation: Secondary | ICD-10-CM | POA: Diagnosis not present

## 2021-06-26 DIAGNOSIS — Z853 Personal history of malignant neoplasm of breast: Secondary | ICD-10-CM

## 2021-06-26 DIAGNOSIS — L719 Rosacea, unspecified: Secondary | ICD-10-CM | POA: Diagnosis not present

## 2021-06-26 DIAGNOSIS — D2371 Other benign neoplasm of skin of right lower limb, including hip: Secondary | ICD-10-CM

## 2021-06-26 DIAGNOSIS — Z85828 Personal history of other malignant neoplasm of skin: Secondary | ICD-10-CM

## 2021-06-26 DIAGNOSIS — Z1283 Encounter for screening for malignant neoplasm of skin: Secondary | ICD-10-CM

## 2021-06-26 DIAGNOSIS — D239 Other benign neoplasm of skin, unspecified: Secondary | ICD-10-CM

## 2021-06-26 MED ORDER — CLOBETASOL PROPIONATE 0.05 % EX SOLN: CUTANEOUS | 3 refills | Status: DC

## 2021-06-26 MED ORDER — DOXYCYCLINE HYCLATE 100 MG PO TABS: 100.0000 mg | ORAL_TABLET | Freq: Every day | ORAL | 3 refills | Status: DC

## 2021-06-26 MED ORDER — METRONIDAZOLE 0.75 % EX GEL: CUTANEOUS | 11 refills | Status: DC

## 2021-06-26 MED ORDER — DOXYCYCLINE HYCLATE 100 MG PO CAPS: 100.0000 mg | ORAL_CAPSULE | Freq: Every day | ORAL | 3 refills | Status: AC

## 2021-06-26 NOTE — Progress Notes (Deleted)
? ?  Follow-Up Visit ?  ?Subjective  ?BERRY GALLACHER is a 88 y.o. female who presents for the following: Annual Exam (History of BCC - The patient presents for Total-Body Skin Exam (TBSE) for skin cancer screening and mole check.  The patient has spots, moles and lesions to be evaluated, some may be new or changing and the patient has concerns that these could be cancer./). ? ? ? ?The following portions of the chart were reviewed this encounter and updated as appropriate:  ?  ?  ? ?Review of Systems:  No other skin or systemic complaints except as noted in HPI or Assessment and Plan. ? ?Objective  ?Well appearing patient in no apparent distress; mood and affect are within normal limits. ? ?A full examination was performed including scalp, head, eyes, ears, nose, lips, neck, chest, axillae, abdomen, back, buttocks, bilateral upper extremities, bilateral lower extremities, hands, feet, fingers, toes, fingernails, and toenails. All findings within normal limits unless otherwise noted below. ? ?Mid Back ?Dermatographism ? ?Right hip x 1, left groin x 1 (2) ?Erythematous stuck-on, waxy papule or plaque ? ?Right calf ?Firm pink/brown papulenodule with dimple sign.  ? ?Left Breast ?No lymphadenopathy ? ? ? ?Assessment & Plan  ? ?History of Basal Cell Carcinoma of the Skin ?- No evidence of recurrence today ?- Recommend regular full body skin exams ?- Recommend daily broad spectrum sunscreen SPF 30+ to sun-exposed areas, reapply every 2 hours as needed.  ?- Call if any new or changing lesions are noted between office visits ? ?Lentigines ?- Scattered tan macules ?- Due to sun exposure ?- Benign-appearing, observe ?- Recommend daily broad spectrum sunscreen SPF 30+ to sun-exposed areas, reapply every 2 hours as needed. ?- Call for any changes ? ?Seborrheic Keratoses ?- Stuck-on, waxy, tan-brown papules and/or plaques  ?- Benign-appearing ?- Discussed benign etiology and prognosis. ?- Observe ?- Call for any  changes ? ?Melanocytic Nevi ?- Tan-brown and/or pink-flesh-colored symmetric macules and papules ?- Benign appearing on exam today ?- Observation ?- Call clinic for new or changing moles ?- Recommend daily use of broad spectrum spf 30+ sunscreen to sun-exposed areas.  ? ?Hemangiomas ?- Red papules ?- Discussed benign nature ?- Observe ?- Call for any changes ? ?Actinic Damage ?- Chronic condition, secondary to cumulative UV/sun exposure ?- diffuse scaly erythematous macules with underlying dyspigmentation ?- Recommend daily broad spectrum sunscreen SPF 30+ to sun-exposed areas, reapply every 2 hours as needed.  ?- Staying in the shade or wearing long sleeves, sun glasses (UVA+UVB protection) and wide brim hats (4-inch brim around the entire circumference of the hat) are also recommended for sun protection.  ?- Call for new or changing lesions. ? ?Skin cancer screening performed today. ? ? ?Return in about 1 year (around 06/27/2022) for TBSE. ? ?I, Ashok Cordia, CMA, am acting as scribe for Sarina Ser, MD . ? ?

## 2021-06-26 NOTE — Progress Notes (Signed)
? ?Follow-Up Visit ?  ?Subjective  ?Paige Medina is a 88 y.o. female who presents for the following: Annual Exam (History of BCC - The patient presents for Total-Body Skin Exam (TBSE) for skin cancer screening and mole check.  The patient has spots, moles and lesions to be evaluated, some may be new or changing and the patient has concerns that these could be cancer./). ? ?The following portions of the chart were reviewed this encounter and updated as appropriate:  ? Tobacco  Allergies  Meds  Problems  Med Hx  Surg Hx  Fam Hx   ?  ?Review of Systems:  No other skin or systemic complaints except as noted in HPI or Assessment and Plan. ? ?Objective  ?Well appearing patient in no apparent distress; mood and affect are within normal limits. ? ?A full examination was performed including scalp, head, eyes, ears, nose, lips, neck, chest, axillae, abdomen, back, buttocks, bilateral upper extremities, bilateral lower extremities, hands, feet, fingers, toes, fingernails, and toenails. All findings within normal limits unless otherwise noted below. ? ?Mid Back ?Dermatographism ? ?Right hip x 1, left groin x 1 (2) ?Erythematous stuck-on, waxy papule or plaque ? ?Right calf ?Firm pink/brown papulenodule with dimple sign.  ? ?Left Breast ?No lymphadenopathy ? ? ?Assessment & Plan  ? ?History of Basal Cell Carcinoma of the Skin ?- No evidence of recurrence today ?- Recommend regular full body skin exams ?- Recommend daily broad spectrum sunscreen SPF 30+ to sun-exposed areas, reapply every 2 hours as needed.  ?- Call if any new or changing lesions are noted between office visits ? ?Lentigines ?- Scattered tan macules ?- Due to sun exposure ?- Benign-appearing, observe ?- Recommend daily broad spectrum sunscreen SPF 30+ to sun-exposed areas, reapply every 2 hours as needed. ?- Call for any changes ? ?Seborrheic Keratoses ?- Stuck-on, waxy, tan-brown papules and/or plaques  ?- Benign-appearing ?- Discussed benign etiology  and prognosis. ?- Observe ?- Call for any changes ? ?Melanocytic Nevi ?- Tan-brown and/or pink-flesh-colored symmetric macules and papules ?- Benign appearing on exam today ?- Observation ?- Call clinic for new or changing moles ?- Recommend daily use of broad spectrum spf 30+ sunscreen to sun-exposed areas.  ? ?Hemangiomas ?- Red papules ?- Discussed benign nature ?- Observe ?- Call for any changes ? ?Actinic Damage ?- Chronic condition, secondary to cumulative UV/sun exposure ?- diffuse scaly erythematous macules with underlying dyspigmentation ?- Recommend daily broad spectrum sunscreen SPF 30+ to sun-exposed areas, reapply every 2 hours as needed.  ?- Staying in the shade or wearing long sleeves, sun glasses (UVA+UVB protection) and wide brim hats (4-inch brim around the entire circumference of the hat) are also recommended for sun protection.  ?- Call for new or changing lesions. ? ?Skin cancer screening performed today. ? ?Dermatographism ?Mid Back ?Recommend taking a nonsedating antihistamine daily ? ?Inflamed seborrheic keratosis (2) ?Right hip x 1, left groin x 1 ?Destruction of lesion - Right hip x 1, left groin x 1 ?Complexity: simple   ?Destruction method: cryotherapy   ?Informed consent: discussed and consent obtained   ?Timeout:  patient name, date of birth, surgical site, and procedure verified ?Lesion destroyed using liquid nitrogen: Yes   ?Region frozen until ice ball extended beyond lesion: Yes   ?Outcome: patient tolerated procedure well with no complications   ?Post-procedure details: wound care instructions given   ? ?Dermatofibroma ?Right calf ?Secondary to insect bite - benig ?Destruction of lesion - Right calf ?Complexity: simple   ?  Destruction method: cryotherapy   ?Informed consent: discussed and consent obtained   ?Timeout:  patient name, date of birth, surgical site, and procedure verified ?Lesion destroyed using liquid nitrogen: Yes   ?Region frozen until ice ball extended beyond lesion:  Yes   ?Outcome: patient tolerated procedure well with no complications   ?Post-procedure details: wound care instructions given   ? ?History of breast cancer ?Left Breast ?Clear ?No lymphadenopathy today. ? ?Rosacea ?Face, scalp ?Rosacea/Folliculitis ?Rosacea is a chronic progressive skin condition usually affecting the face of adults, causing redness and/or acne bumps. It is treatable but not curable. It sometimes affects the eyes (ocular rosacea) as well. It may respond to topical and/or systemic medication and can flare with stress, sun exposure, alcohol, exercise and some foods.  Daily application of broad spectrum spf 30+ sunscreen to face is recommended to reduce flares. ?Chronic and persistent condition with duration or expected duration over one year. Condition is symptomatic / bothersome to patient. Not to goal.  ? ?Continue Doxycycline 100 mg 1 po qd, Metronidazole 0.75% gel qd-bid, Clobetasol solution qd 5 times per week to scalp prn  ?May consider Skin Medicinals Rosacea Triple Cream if not staying controlled ? ?doxycycline (VIBRAMYCIN) 100 MG capsule - Face, scalp ?Take 1 capsule (100 mg total) by mouth daily. With food and plenty of fluid ? ?Nail dystrophy ? ?Skin cancer screening ? ?Return in about 1 year (around 06/27/2022) for TBSE. ? ?I, Ashok Cordia, CMA, am acting as scribe for Sarina Ser, MD . ?Documentation: I have reviewed the above documentation for accuracy and completeness, and I agree with the above. ? ?Sarina Ser, MD ? ?

## 2021-06-26 NOTE — Progress Notes (Deleted)
? ?Follow-Up Visit ?  ?Subjective  ?Paige Medina is a 88 y.o. female who presents for the following: Annual Exam (History of BCC - The patient presents for Total-Body Skin Exam (TBSE) for skin cancer screening and mole check.  The patient has spots, moles and lesions to be evaluated, some may be new or changing and the patient has concerns that these could be cancer./). ? ? ? ?The following portions of the chart were reviewed this encounter and updated as appropriate:  ?  ?  ? ?Review of Systems:  No other skin or systemic complaints except as noted in HPI or Assessment and Plan. ? ?Objective  ?Well appearing patient in no apparent distress; mood and affect are within normal limits. ? ?A full examination was performed including scalp, head, eyes, ears, nose, lips, neck, chest, axillae, abdomen, back, buttocks, bilateral upper extremities, bilateral lower extremities, hands, feet, fingers, toes, fingernails, and toenails. All findings within normal limits unless otherwise noted below. ? ?Mid Back ?Dermatographism ? ?Right hip x 1, left groin x 1 (2) ?Erythematous stuck-on, waxy papule or plaque ? ?Right calf ?Firm pink/brown papulenodule with dimple sign.  ? ?Left Breast ?No lymphadenopathy ? ? ? ?Assessment & Plan  ?Dermatographism ?Mid Back ? ?Recommend taking an antihistamine daily ? ?Inflamed seborrheic keratosis (2) ?Right hip x 1, left groin x 1 ? ?Destruction of lesion - Right hip x 1, left groin x 1 ?Complexity: simple   ?Destruction method: cryotherapy   ?Informed consent: discussed and consent obtained   ?Timeout:  patient name, date of birth, surgical site, and procedure verified ?Lesion destroyed using liquid nitrogen: Yes   ?Region frozen until ice ball extended beyond lesion: Yes   ?Outcome: patient tolerated procedure well with no complications   ?Post-procedure details: wound care instructions given   ? ?Dermatofibroma ?Right calf ? ?Secondary to insect bite - benign ? ?Destruction of lesion - Right  calf ?Complexity: simple   ?Destruction method: cryotherapy   ?Informed consent: discussed and consent obtained   ?Timeout:  patient name, date of birth, surgical site, and procedure verified ?Lesion destroyed using liquid nitrogen: Yes   ?Region frozen until ice ball extended beyond lesion: Yes   ?Outcome: patient tolerated procedure well with no complications   ?Post-procedure details: wound care instructions given   ? ?History of breast cancer ?Left Breast ? ?Clear ? ? ? ?Rosacea ?Face, scalp ? ?Rosacea/Folliculitis ? ?Rosacea is a chronic progressive skin condition usually affecting the face of adults, causing redness and/or acne bumps. It is treatable but not curable. It sometimes affects the eyes (ocular rosacea) as well. It may respond to topical and/or systemic medication and can flare with stress, sun exposure, alcohol, exercise and some foods.  Daily application of broad spectrum spf 30+ sunscreen to face is recommended to reduce flares. ?Chronic and persistent condition with duration or expected duration over one year. Condition is symptomatic / bothersome to patient. Not to goal.  ? ?Continue Doxycycline 100 mg 1 po qd, Metronidazole 0.75% gel qd-bid, Clobetasol solution qd 5 times per week to scalp prn  ? ?May consider Skin Medicinals Rosacea Triple Cream if not staying controlled ? ?doxycycline (VIBRA-TABS) 100 MG tablet - Face, scalp ?Take 1 tablet (100 mg total) by mouth daily. Takes 2 days on, 1 day off for acne ? ?Nail dystrophy ? ?Related Medications ?doxycycline (VIBRA-TABS) 100 MG tablet ?Take 1 tablet (100 mg total) by mouth daily. Takes 2 days on, 1 day off for acne ? ? ?Return  in about 1 year (around 06/27/2022) for TBSE. ? ?I, Ashok Cordia, CMA, am acting as scribe for Sarina Ser, MD . ? ?

## 2021-06-26 NOTE — Patient Instructions (Signed)

## 2021-06-29 DIAGNOSIS — E785 Hyperlipidemia, unspecified: Secondary | ICD-10-CM

## 2021-06-29 DIAGNOSIS — I10 Essential (primary) hypertension: Secondary | ICD-10-CM | POA: Diagnosis not present

## 2021-07-02 DIAGNOSIS — M48062 Spinal stenosis, lumbar region with neurogenic claudication: Secondary | ICD-10-CM | POA: Diagnosis not present

## 2021-07-02 DIAGNOSIS — M25552 Pain in left hip: Secondary | ICD-10-CM | POA: Diagnosis not present

## 2021-07-02 DIAGNOSIS — M5442 Lumbago with sciatica, left side: Secondary | ICD-10-CM | POA: Diagnosis not present

## 2021-07-02 DIAGNOSIS — G8929 Other chronic pain: Secondary | ICD-10-CM | POA: Diagnosis not present

## 2021-07-08 ENCOUNTER — Encounter: Payer: Self-pay | Admitting: Dermatology

## 2021-07-31 DIAGNOSIS — C50212 Malignant neoplasm of upper-inner quadrant of left female breast: Secondary | ICD-10-CM | POA: Diagnosis not present

## 2021-07-31 DIAGNOSIS — Z170 Estrogen receptor positive status [ER+]: Secondary | ICD-10-CM | POA: Diagnosis not present

## 2021-08-14 DIAGNOSIS — H43813 Vitreous degeneration, bilateral: Secondary | ICD-10-CM | POA: Diagnosis not present

## 2021-08-14 DIAGNOSIS — Z961 Presence of intraocular lens: Secondary | ICD-10-CM | POA: Diagnosis not present

## 2021-08-14 DIAGNOSIS — E119 Type 2 diabetes mellitus without complications: Secondary | ICD-10-CM | POA: Diagnosis not present

## 2021-09-18 ENCOUNTER — Ambulatory Visit (INDEPENDENT_AMBULATORY_CARE_PROVIDER_SITE_OTHER): Payer: PPO | Admitting: Cardiovascular Disease

## 2021-09-18 ENCOUNTER — Encounter: Payer: Self-pay | Admitting: Cardiovascular Disease

## 2021-09-18 VITALS — BP 160/70 | HR 60 | Ht 65.0 in | Wt 137.0 lb

## 2021-09-18 DIAGNOSIS — I10 Essential (primary) hypertension: Secondary | ICD-10-CM | POA: Diagnosis not present

## 2021-09-18 DIAGNOSIS — E785 Hyperlipidemia, unspecified: Secondary | ICD-10-CM | POA: Diagnosis not present

## 2021-09-18 DIAGNOSIS — I493 Ventricular premature depolarization: Secondary | ICD-10-CM | POA: Diagnosis not present
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
## 2021-09-18 NOTE — Patient Instructions (Signed)

## 2021-10-14 ENCOUNTER — Other Ambulatory Visit: Payer: Self-pay | Admitting: General Surgery

## 2021-10-14 DIAGNOSIS — G8929 Other chronic pain: Secondary | ICD-10-CM | POA: Diagnosis not present

## 2021-10-14 DIAGNOSIS — M25552 Pain in left hip: Secondary | ICD-10-CM | POA: Diagnosis not present

## 2021-10-14 DIAGNOSIS — M5442 Lumbago with sciatica, left side: Secondary | ICD-10-CM | POA: Diagnosis not present

## 2021-10-14 DIAGNOSIS — Z1231 Encounter for screening mammogram for malignant neoplasm of breast: Secondary | ICD-10-CM

## 2021-10-14 DIAGNOSIS — M5441 Lumbago with sciatica, right side: Secondary | ICD-10-CM | POA: Diagnosis not present

## 2021-10-14 DIAGNOSIS — M48062 Spinal stenosis, lumbar region with neurogenic claudication: Secondary | ICD-10-CM | POA: Diagnosis not present

## 2021-10-23 DIAGNOSIS — E119 Type 2 diabetes mellitus without complications: Secondary | ICD-10-CM | POA: Diagnosis not present

## 2021-10-23 DIAGNOSIS — M48062 Spinal stenosis, lumbar region with neurogenic claudication: Secondary | ICD-10-CM | POA: Diagnosis not present

## 2021-10-25 ENCOUNTER — Other Ambulatory Visit: Payer: Self-pay | Admitting: Family Medicine

## 2021-10-25 DIAGNOSIS — E119 Type 2 diabetes mellitus without complications: Secondary | ICD-10-CM

## 2021-10-25 DIAGNOSIS — E1122 Type 2 diabetes mellitus with diabetic chronic kidney disease: Secondary | ICD-10-CM

## 2021-10-25 DIAGNOSIS — E1169 Type 2 diabetes mellitus with other specified complication: Secondary | ICD-10-CM

## 2021-10-30 ENCOUNTER — Other Ambulatory Visit (INDEPENDENT_AMBULATORY_CARE_PROVIDER_SITE_OTHER): Payer: PPO

## 2021-10-30 DIAGNOSIS — E785 Hyperlipidemia, unspecified: Secondary | ICD-10-CM

## 2021-10-30 DIAGNOSIS — E1122 Type 2 diabetes mellitus with diabetic chronic kidney disease: Secondary | ICD-10-CM | POA: Diagnosis not present

## 2021-10-30 DIAGNOSIS — E119 Type 2 diabetes mellitus without complications: Secondary | ICD-10-CM

## 2021-10-30 DIAGNOSIS — E1169 Type 2 diabetes mellitus with other specified complication: Secondary | ICD-10-CM

## 2021-10-30 DIAGNOSIS — N183 Chronic kidney disease, stage 3 unspecified: Secondary | ICD-10-CM | POA: Diagnosis not present

## 2021-10-30 LAB — CBC WITH DIFFERENTIAL/PLATELET
Basophils Absolute: 0.2 10*3/uL — ABNORMAL HIGH (ref 0.0–0.1)
Basophils Relative: 2.5 % (ref 0.0–3.0)
Eosinophils Absolute: 0.3 10*3/uL (ref 0.0–0.7)
Eosinophils Relative: 4.6 % (ref 0.0–5.0)
HCT: 40.8 % (ref 36.0–46.0)
Hemoglobin: 13.5 g/dL (ref 12.0–15.0)
Lymphocytes Relative: 35.4 % (ref 12.0–46.0)
Lymphs Abs: 2.1 10*3/uL (ref 0.7–4.0)
MCHC: 33.2 g/dL (ref 30.0–36.0)
MCV: 95.5 fl (ref 78.0–100.0)
Monocytes Absolute: 0.5 10*3/uL (ref 0.1–1.0)
Monocytes Relative: 8.9 % (ref 3.0–12.0)
Neutro Abs: 2.9 10*3/uL (ref 1.4–7.7)
Neutrophils Relative %: 48.6 % (ref 43.0–77.0)
Platelets: 209 10*3/uL (ref 150.0–400.0)
RBC: 4.27 Mil/uL (ref 3.87–5.11)
RDW: 12.6 % (ref 11.5–15.5)
WBC: 6 10*3/uL (ref 4.0–10.5)

## 2021-10-30 LAB — HEMOGLOBIN A1C: Hgb A1c MFr Bld: 6.4 % (ref 4.6–6.5)

## 2021-10-30 LAB — MICROALBUMIN / CREATININE URINE RATIO
Creatinine,U: 47.1 mg/dL
Microalb Creat Ratio: 5.7 mg/g (ref 0.0–30.0)
Microalb, Ur: 2.7 mg/dL — ABNORMAL HIGH (ref 0.0–1.9)

## 2021-10-30 LAB — COMPREHENSIVE METABOLIC PANEL
ALT: 19 U/L (ref 0–35)
AST: 22 U/L (ref 0–37)
Albumin: 4.1 g/dL (ref 3.5–5.2)
Alkaline Phosphatase: 75 U/L (ref 39–117)
BUN: 20 mg/dL (ref 6–23)
CO2: 27 mEq/L (ref 19–32)
Calcium: 9.6 mg/dL (ref 8.4–10.5)
Chloride: 107 mEq/L (ref 96–112)
Creatinine, Ser: 0.98 mg/dL (ref 0.40–1.20)
GFR: 52.27 mL/min — ABNORMAL LOW (ref >60.00)
Glucose, Bld: 116 mg/dL — ABNORMAL HIGH (ref 70–99)
Potassium: 4.3 mEq/L (ref 3.5–5.1)
Sodium: 142 mEq/L (ref 135–145)
Total Bilirubin: 0.6 mg/dL (ref 0.2–1.2)
Total Protein: 7 g/dL (ref 6.0–8.3)

## 2021-10-30 LAB — LIPID PANEL
Cholesterol: 186 mg/dL (ref 0–200)
HDL: 58.2 mg/dL (ref >39.00)
LDL Cholesterol: 96 mg/dL (ref 0–99)
NonHDL: 128.28
Total CHOL/HDL Ratio: 3
Triglycerides: 160 mg/dL — ABNORMAL HIGH (ref 0.0–149.0)
VLDL: 32 mg/dL (ref 0.0–40.0)

## 2021-10-30 LAB — VITAMIN D 25 HYDROXY (VIT D DEFICIENCY, FRACTURES): VITD: 38.17 ng/mL (ref 30.00–100.00)

## 2021-10-31 LAB — PARATHYROID HORMONE, INTACT (NO CA): PTH: 68 pg/mL (ref 16–77)

## 2021-11-04 ENCOUNTER — Ambulatory Visit: Payer: Medicare Other

## 2021-11-05 DIAGNOSIS — M5412 Radiculopathy, cervical region: Secondary | ICD-10-CM | POA: Diagnosis not present

## 2021-11-05 DIAGNOSIS — M25552 Pain in left hip: Secondary | ICD-10-CM | POA: Diagnosis not present

## 2021-11-05 DIAGNOSIS — M542 Cervicalgia: Secondary | ICD-10-CM | POA: Diagnosis not present

## 2021-11-05 DIAGNOSIS — M5442 Lumbago with sciatica, left side: Secondary | ICD-10-CM | POA: Diagnosis not present

## 2021-11-05 DIAGNOSIS — M48062 Spinal stenosis, lumbar region with neurogenic claudication: Secondary | ICD-10-CM | POA: Diagnosis not present

## 2021-11-05 DIAGNOSIS — G8929 Other chronic pain: Secondary | ICD-10-CM | POA: Diagnosis not present

## 2021-11-05 DIAGNOSIS — M5441 Lumbago with sciatica, right side: Secondary | ICD-10-CM | POA: Diagnosis not present

## 2021-11-06 ENCOUNTER — Ambulatory Visit (INDEPENDENT_AMBULATORY_CARE_PROVIDER_SITE_OTHER): Payer: PPO

## 2021-11-06 VITALS — Wt 137.0 lb

## 2021-11-06 DIAGNOSIS — Z0000 Encounter for general adult medical examination without abnormal findings: Secondary | ICD-10-CM

## 2021-11-06 NOTE — Patient Instructions (Signed)
[name] , Thank you for taking time to come for your Medicare Wellness Visit. I appreciate your ongoing commitment to your health goals. Please review the following plan we discussed and let me know if I can assist you in the future.   Screening recommendations/referrals: Colonoscopy: aged out Mammogram: aged out- has appt in Sept. Bone Density: aged out Recommended yearly ophthalmology/optometry visit for glaucoma screening and checkup Recommended yearly dental visit for hygiene and checkup  Vaccinations: Influenza vaccine: n/d Pneumococcal vaccine: 09/14/13 Tdap vaccine: 07/27/05 Shingles vaccine: Zostavax 04/29/05   Covid-19:03/16/19, 04/13/19, 12/22/19, 07/27/20  Advanced directives: yes  Conditions/risks identified: none  Next appointment: Follow up in one year for your annual wellness visit 11/10/22 @ 10:30 am by phone   Preventive Care 65 Years and Older, Female Preventive care refers to lifestyle choices and visits with your health care provider that can promote health and wellness. What does preventive care include? A yearly physical exam. This is also called an annual well check. Dental exams once or twice a year. Routine eye exams. Ask your health care provider how often you should have your eyes checked. Personal lifestyle choices, including: Daily care of your teeth and gums. Regular physical activity. Eating a healthy diet. Avoiding tobacco and drug use. Limiting alcohol use. Practicing safe sex. Taking low-dose aspirin every day. Taking vitamin and mineral supplements as recommended by your health care provider. What happens during an annual well check? The services and screenings done by your health care provider during your annual well check will depend on your age, overall health, lifestyle risk factors, and family history of disease. Counseling  Your health care provider may ask you questions about your: Alcohol use. Tobacco use. Drug use. Emotional  well-being. Home and relationship well-being. Sexual activity. Eating habits. History of falls. Memory and ability to understand (cognition). Work and work Statistician. Reproductive health. Screening  You may have the following tests or measurements: Height, weight, and BMI. Blood pressure. Lipid and cholesterol levels. These may be checked every 5 years, or more frequently if you are over 54 years old. Skin check. Lung cancer screening. You may have this screening every year starting at age 58 if you have a 30-pack-year history of smoking and currently smoke or have quit within the past 15 years. Fecal occult blood test (FOBT) of the stool. You may have this test every year starting at age 34. Flexible sigmoidoscopy or colonoscopy. You may have a sigmoidoscopy every 5 years or a colonoscopy every 10 years starting at age 73. Hepatitis C blood test. Hepatitis B blood test. Sexually transmitted disease (STD) testing. Diabetes screening. This is done by checking your blood sugar (glucose) after you have not eaten for a while (fasting). You may have this done every 1-3 years. Bone density scan. This is done to screen for osteoporosis. You may have this done starting at age 42. Mammogram. This may be done every 1-2 years. Talk to your health care provider about how often you should have regular mammograms. Talk with your health care provider about your test results, treatment options, and if necessary, the need for more tests. Vaccines  Your health care provider may recommend certain vaccines, such as: Influenza vaccine. This is recommended every year. Tetanus, diphtheria, and acellular pertussis (Tdap, Td) vaccine. You may need a Td booster every 10 years. Zoster vaccine. You may need this after age 75. Pneumococcal 13-valent conjugate (PCV13) vaccine. One dose is recommended after age 67. Pneumococcal polysaccharide (PPSV23) vaccine. One dose is  recommended after age 34. Talk to your  health care provider about which screenings and vaccines you need and how often you need them. This information is not intended to replace advice given to you by your health care provider. Make sure you discuss any questions you have with your health care provider. Document Released: 03/15/2015 Document Revised: 11/06/2015 Document Reviewed: 12/18/2014 Elsevier Interactive Patient Education  2017 [place] Prevention in the Home Falls can cause injuries. They can happen to people of all ages. There are many things you can do to make your home safe and to help prevent falls. What can I do on the outside of my home? Regularly fix the edges of walkways and driveways and fix any cracks. Remove anything that might make you trip as you walk through a door, such as a raised step or threshold. Trim any bushes or trees on the path to your home. Use bright outdoor lighting. Clear any walking paths of anything that might make someone trip, such as rocks or tools. Regularly check to see if handrails are loose or broken. Make sure that both sides of any steps have handrails. Any raised decks and porches should have guardrails on the edges. Have any leaves, snow, or ice cleared regularly. Use sand or salt on walking paths during winter. Clean up any spills in your garage right away. This includes oil or grease spills. What can I do in the bathroom? Use night lights. Install grab bars by the toilet and in the tub and shower. Do not use towel bars as grab bars. Use non-skid mats or decals in the tub or shower. If you need to sit down in the shower, use a plastic, non-slip stool. Keep the floor dry. Clean up any water that spills on the floor as soon as it happens. Remove soap buildup in the tub or shower regularly. Attach bath mats securely with double-sided non-slip rug tape. Do not have throw rugs and other things on the floor that can make you trip. What can I do in the bedroom? Use night  lights. Make sure that you have a light by your bed that is easy to reach. Do not use any sheets or blankets that are too big for your bed. They should not hang down onto the floor. Have a firm chair that has side arms. You can use this for support while you get dressed. Do not have throw rugs and other things on the floor that can make you trip. What can I do in the kitchen? Clean up any spills right away. Avoid walking on wet floors. Keep items that you use a lot in easy-to-reach places. If you need to reach something above you, use a strong step stool that has a grab bar. Keep electrical cords out of the way. Do not use floor polish or wax that makes floors slippery. If you must use wax, use non-skid floor wax. Do not have throw rugs and other things on the floor that can make you trip. What can I do with my stairs? Do not leave any items on the stairs. Make sure that there are handrails on both sides of the stairs and use them. Fix handrails that are broken or loose. Make sure that handrails are as long as the stairways. Check any carpeting to make sure that it is firmly attached to the stairs. Fix any carpet that is loose or worn. Avoid having throw rugs at the top or bottom of the stairs. If  you do have throw rugs, attach them to the floor with carpet tape. Make sure that you have a light switch at the top of the stairs and the bottom of the stairs. If you do not have them, ask someone to add them for you. What else can I do to help prevent falls? Wear shoes that: Do not have high heels. Have rubber bottoms. Are comfortable and fit you well. Are closed at the toe. Do not wear sandals. If you use a stepladder: Make sure that it is fully opened. Do not climb a closed stepladder. Make sure that both sides of the stepladder are locked into place. Ask someone to hold it for you, if possible. Clearly mark and make sure that you can see: Any grab bars or handrails. First and last  steps. Where the edge of each step is. Use tools that help you move around (mobility aids) if they are needed. These include: Canes. Walkers. Scooters. Crutches. Turn on the lights when you go into a dark area. Replace any light bulbs as soon as they burn out. Set up your furniture so you have a clear path. Avoid moving your furniture around. If any of your floors are uneven, fix them. If there are any pets around you, be aware of where they are. Review your medicines with your doctor. Some medicines can make you feel dizzy. This can increase your chance of falling. Ask your doctor what other things that you can do to help prevent falls. This information is not intended to replace advice given to you by your health care provider. Make sure you discuss any questions you have with your health care provider. Document Released: 12/13/2008 Document Revised: 07/25/2015 Document Reviewed: 03/23/2014 Elsevier Interactive Patient Education  2017 [employer].
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
## 2021-11-07 ENCOUNTER — Encounter: Payer: Medicare Other | Admitting: Family Medicine

## 2021-11-12 ENCOUNTER — Ambulatory Visit (INDEPENDENT_AMBULATORY_CARE_PROVIDER_SITE_OTHER): Payer: PPO | Admitting: Family Medicine

## 2021-11-12 ENCOUNTER — Encounter: Payer: Self-pay | Admitting: Family Medicine

## 2021-11-12 VITALS — BP 128/72 | HR 58 | Temp 97.7°F | Ht 64.75 in | Wt 138.5 lb

## 2021-11-12 DIAGNOSIS — G8929 Other chronic pain: Secondary | ICD-10-CM

## 2021-11-12 DIAGNOSIS — E785 Hyperlipidemia, unspecified: Secondary | ICD-10-CM | POA: Diagnosis not present

## 2021-11-12 DIAGNOSIS — N183 Chronic kidney disease, stage 3 unspecified: Secondary | ICD-10-CM | POA: Diagnosis not present

## 2021-11-12 DIAGNOSIS — I10 Essential (primary) hypertension: Secondary | ICD-10-CM

## 2021-11-12 DIAGNOSIS — Z0000 Encounter for general adult medical examination without abnormal findings: Secondary | ICD-10-CM | POA: Diagnosis not present

## 2021-11-12 DIAGNOSIS — E119 Type 2 diabetes mellitus without complications: Secondary | ICD-10-CM | POA: Diagnosis not present

## 2021-11-12 DIAGNOSIS — M545 Low back pain, unspecified: Secondary | ICD-10-CM | POA: Diagnosis not present

## 2021-11-12 DIAGNOSIS — E1169 Type 2 diabetes mellitus with other specified complication: Secondary | ICD-10-CM

## 2021-11-12 DIAGNOSIS — E1122 Type 2 diabetes mellitus with diabetic chronic kidney disease: Secondary | ICD-10-CM

## 2021-11-12 DIAGNOSIS — M85832 Other specified disorders of bone density and structure, left forearm: Secondary | ICD-10-CM | POA: Diagnosis not present

## 2021-11-12 DIAGNOSIS — Z66 Do not resuscitate: Secondary | ICD-10-CM | POA: Diagnosis not present

## 2021-11-12 DIAGNOSIS — Z853 Personal history of malignant neoplasm of breast: Secondary | ICD-10-CM | POA: Diagnosis not present

## 2021-11-12 MED ORDER — LOVASTATIN 40 MG PO TABS: 40.0000 mg | ORAL_TABLET | Freq: Every day | ORAL | 3 refills | Status: DC

## 2021-11-12 MED ORDER — CALCIUM CARB-CHOLECALCIFEROL 600-20 MG-MCG PO TABS: 1.0000 | ORAL_TABLET | Freq: Every day | ORAL | Status: AC

## 2021-11-12 MED ORDER — ONETOUCH DELICA LANCETS 33G MISC: 2 refills | Status: DC

## 2021-11-12 MED ORDER — ONETOUCH ULTRA VI STRP: ORAL_STRIP | 2 refills | Status: DC

## 2021-11-12 NOTE — Assessment & Plan Note (Signed)
Chronic, diet controlled.  Refilled One Touch Deluca lancets and strips.

## 2021-11-12 NOTE — Patient Instructions (Addendum)
Call to to schedule bone density scan: [facility] [phone] If interested, check with pharmacy about new 2 shot shingles series (shingrix).  Good to see you today  Return as needed or in 1 year for next physical  Health Maintenance After Age 86 After age 40, you are at a higher risk for certain long-term diseases and infections as well as injuries from falls. Falls are a major cause of broken bones and head injuries in people who are older than age 67. Getting regular preventive care can help to keep you healthy and well. Preventive care includes getting regular testing and making lifestyle changes as recommended by your health care provider. Talk with your health care provider about: Which screenings and tests you should have. A screening is a test that checks for a disease when you have no symptoms. A diet and exercise plan that is right for you. What should I know about screenings and tests to prevent falls? Screening and testing are the best ways to find a health problem early. Early diagnosis and treatment give you the best chance of managing medical conditions that are common after age 80. Certain conditions and lifestyle choices may make you more likely to have a fall. Your health care provider may recommend: Regular vision checks. Poor vision and conditions such as cataracts can make you more likely to have a fall. If you wear glasses, make sure to get your prescription updated if your vision changes. Medicine review. Work with your health care provider to regularly review all of the medicines you are taking, including over-the-counter medicines. Ask your health care provider about any side effects that may make you more likely to have a fall. Tell your health care provider if any medicines that you take make you feel dizzy or sleepy. Strength and balance checks. Your health care provider may recommend certain tests to check your strength and balance while standing,  walking, or changing positions. Foot health exam. Foot pain and numbness, as well as not wearing proper footwear, can make you more likely to have a fall. Screenings, including: Osteoporosis screening. Osteoporosis is a condition that causes the bones to get weaker and break more easily. Blood pressure screening. Blood pressure changes and medicines to control blood pressure can make you feel dizzy. Depression screening. You may be more likely to have a fall if you have a fear of falling, feel depressed, or feel unable to do activities that you used to do. Alcohol use screening. Using too much alcohol can affect your balance and may make you more likely to have a fall. Follow these instructions at home: Lifestyle Do not drink alcohol if: Your health care provider tells you not to drink. If you drink alcohol: Limit how much you have to: 0-1 drink a day for women. 0-2 drinks a day for men. Know how much alcohol is in your drink. In the [place] one drink equals one 12 oz bottle of beer (355 mL), one 5 oz glass of wine (148 mL), or one 1 oz glass of hard liquor (44 mL). Do not use any products that contain nicotine or tobacco. These products include cigarettes, chewing tobacco, and vaping devices, such as e-cigarettes. If you need help quitting, ask your health care provider. Activity  Follow a regular exercise program to stay fit. This will help you maintain your balance. Ask your health care provider what types of exercise are appropriate for you. If you need a cane or walker, use it  as recommended by your health care provider. Wear supportive shoes that have nonskid soles. Safety  Remove any tripping hazards, such as rugs, cords, and clutter. Install safety equipment such as grab bars in bathrooms and safety rails on stairs. Keep rooms and walkways well-lit. General instructions Talk with your health care provider about your risks for falling. Tell your health care provider if: You fall.  Be sure to tell your health care provider about all falls, even ones that seem minor. You feel dizzy, tiredness (fatigue), or off-balance. Take over-the-counter and prescription medicines only as told by your health care provider. These include supplements. Eat a healthy diet and maintain a healthy weight. A healthy diet includes low-fat dairy products, low-fat (lean) meats, and fiber from whole grains, beans, and lots of fruits and vegetables. Stay current with your vaccines. Schedule regular health, dental, and eye exams. Summary Having a healthy lifestyle and getting preventive care can help to protect your health and wellness after age 85. Screening and testing are the best way to find a health problem early and help you avoid having a fall. Early diagnosis and treatment give you the best chance for managing medical conditions that are more common for people who are older than age 75. Falls are a major cause of broken bones and head injuries in people who are older than age 42. Take precautions to prevent a fall at home. Work with your health care provider to learn what changes you can make to improve your health and wellness and to prevent falls. This information is not intended to replace advice given to you by your health care provider. Make sure you discuss any questions you have with your health care provider. Document Revised: 07/08/2020 Document Reviewed: 07/08/2020 Elsevier Patient Education  [employer].

## 2021-11-12 NOTE — Assessment & Plan Note (Signed)
Preventative protocols reviewed and updated unless pt declined. Discussed healthy diet and lifestyle.  

## 2021-11-12 NOTE — Progress Notes (Signed)
Patient ID: Paige Medina, female    DOB: 1934-01-13, 88 y.o.   MRN: [id number]  This visit was conducted in person.  BP 128/72 Comment: home BP last week  Pulse (!) 58   Temp 97.7 F (36.5 C) (Temporal)   Ht 5' 4.75" (1.645 m)   Wt 138 lb 8 oz (62.8 kg)   SpO2 99%   BMI 23.23 kg/m   BP Readings from Last 3 Encounters:  11/12/21 128/72  09/18/21 (!) 160/70  11/05/20 140/68  168/66 when rechecked in office. Home readings well controlled.   CC: CPE Subjective:   HPI: Paige Medina is a 88 y.o. female presenting on 11/12/2021 for Annual Exam (MCR prt 2. )   Saw health advisor last week for medicare wellness visit. Note reviewed.  No results found.  Flowsheet Row Clinical Support from 11/06/2021 in [place] at [place]  PHQ-2 Total Score 0          11/06/2021    2:03 PM 11/05/2020    8:59 AM 11/01/2019    3:04 PM 10/21/2018    9:36 AM 10/12/2017    8:09 AM  Fall Risk   Falls in the past year? 0 0 0 0 Yes  Comment     fall occurred while walking up stairs; denies injury  Number falls in past yr: 0  0  1  Injury with Fall? 0  0  No  Risk for fall due to : No Fall Risks      Follow up Falls evaluation completed  Falls evaluation completed     Moved to [facility].  White coat hypertension - office BP runs high - Home BPs run well controlled last week 128/72  Struggling with arthritis pain. Recent lumbar spine injection, upcoming hip steroid injections. Regularly takes tylenol bid and hemp products to manage pain.   Preventative:   COLONOSCOPY 12/2014 mod diverticulosis o/w WNL, f/u prn [name]) Well woman - s/p hysterectomy 1977, ovaries remain. Denies sxs.  Breast cancer - Birdas1 11/2020 @ [place]. Upcoming appt already scheduled DEXA 01/2018 - T -1.3 LFN, -2.2 L forearm, hip fracture risk 3.3% (elevated)  Lung cancer screening - not eligible  Flu yearly  COVID vaccine - Moderna 03/2019, 04/2019, booster 12/2019, 06/2020 Pneumovax 06/2005. Prevnar-13  2015 Td 06/2005  shingrix - discussed.  Advanced directives: Scanned into chart 10/2014. [name] husband then Ray Church and Wille Glaser are [facility]. Does not want prolonged life support if terminal or irreversible condition.  Seat belt use discussed.  Sunscreen use discussed. No changing moles on skin. New growth to left shoulder.  Sleep - averages 7-8 hours Non smoker  Alcohol - rare  Dentist Q6 mo  Eye exam - yearly  Bowel - chronic constipation managed with stool softener and probiotic, occasional miralax  Bladder - no incontinence   Lives with husband, no pets   Grown children   Occupation: retired, varied jobs Veterinary surgeon   Activity: gym 3d/wk, active mentally with crosswords   Diet: good water, fruits/vegetables daily      Relevant past medical, surgical, family and social history reviewed and updated as indicated. Interim medical history since our last visit reviewed. Allergies and medications reviewed and updated. Outpatient Medications Prior to Visit  Medication Sig Dispense Refill   b complex vitamins capsule Take 1 capsule by mouth daily.     beta carotene w/minerals (OCUVITE) tablet Take 1 tablet by mouth every other day.     Blood Glucose Monitoring Suppl (  ONE TOUCH ULTRA 2) w/Device KIT Use as directed 1 each 0   Boswellia-Glucosamine-Vit D (OSTEO BI-FLEX-GLUCOS/5-LOXIN) TABS Take 1 tablet by mouth daily.     Cholecalciferol 125 MCG (5000 UT) TABS Take 1 tablet by mouth every other day.     clobetasol (TEMOVATE) 0.05 % external solution APPLY A SMALL AMOUNT TO AFFECTED AREA ONCE A DAY UP TO 5 TIMES PER WEEK 50 mL 3   Coenzyme Q10 (CO Q-10) 200 MG CAPS Take 200 mg by mouth every morning.      docusate sodium (COLACE) 100 MG capsule Take 100 mg by mouth 2 (two) times daily.     doxycycline (VIBRAMYCIN) 100 MG capsule Take 100 mg by mouth daily.     GENTEAL SEVERE 0.3 % GEL ophthalmic ointment      metroNIDAZOLE (METROGEL) 0.75 % gel APPLY SMALL AMOUNT EXTERNALLY TO THE  AFFECTED AREA 1 TO 2 TIMES DAILY AS DIRECTED 45 g 11   olmesartan (BENICAR) 40 MG tablet TAKE 1 TABLET BY MOUTH EVERY DAY 90 tablet 2   Probiotic Product (PROBIOTIC DAILY PO) Take 1 capsule by mouth at bedtime.      triamcinolone cream (KENALOG) 0.1 % Apply 1 application topically 2 (two) times daily as needed. 30 g 1   vitamin C (ASCORBIC ACID) 500 MG tablet Take 500 mg by mouth daily.     glucose blood (ONETOUCH ULTRA) test strip Use as instructed to check blood sugar once daily. 100 each 2   lovastatin (MEVACOR) 40 MG tablet TAKE 1 TABLET BY MOUTH EVERY DAY 90 tablet 3   OneTouch Delica Lancets 91M MISC Use as instructed to check blood sugar once daily. 100 each 2   No facility-administered medications prior to visit.     Per HPI unless specifically indicated in ROS section below Review of Systems  Constitutional:  Negative for activity change, appetite change, chills, fatigue, fever and unexpected weight change.  HENT:  Negative for hearing loss.   Eyes:  Negative for visual disturbance.  Respiratory:  Negative for cough, chest tightness, shortness of breath and wheezing.   Cardiovascular:  Negative for chest pain, palpitations and leg swelling.  Gastrointestinal:  Positive for constipation (well controlled). Negative for abdominal distention, abdominal pain, blood in stool, diarrhea, nausea and vomiting.  Genitourinary:  Negative for difficulty urinating and hematuria.  Musculoskeletal:  Negative for arthralgias, myalgias and neck pain.  Skin:  Negative for rash.  Neurological:  Negative for dizziness, seizures, syncope and headaches.  Hematological:  Negative for adenopathy. Does not bruise/bleed easily.  Psychiatric/Behavioral:  Negative for dysphoric mood. The patient is not nervous/anxious.     Objective:  BP 128/72 Comment: home BP last week  Pulse (!) 58   Temp 97.7 F (36.5 C) (Temporal)   Ht 5' 4.75" (1.645 m)   Wt 138 lb 8 oz (62.8 kg)   SpO2 99%   BMI 23.23 kg/m    Wt Readings from Last 3 Encounters:  11/12/21 138 lb 8 oz (62.8 kg)  11/06/21 137 lb (62.1 kg)  09/18/21 137 lb (62.1 kg)      Physical Exam Vitals and nursing note reviewed.  Constitutional:      Appearance: Normal appearance. She is not ill-appearing.  HENT:     Head: Normocephalic and atraumatic.     Right Ear: Tympanic membrane, ear canal and external ear normal. There is no impacted cerumen.     Left Ear: Tympanic membrane, ear canal and external ear normal. There is no impacted  cerumen.  Eyes:     General:        Right eye: No discharge.        Left eye: No discharge.     Extraocular Movements: Extraocular movements intact.     Conjunctiva/sclera: Conjunctivae normal.     Pupils: Pupils are equal, round, and reactive to light.  Neck:     Thyroid: No thyroid mass or thyromegaly.     Vascular: No carotid bruit.  Cardiovascular:     Rate and Rhythm: Normal rate and regular rhythm.     Pulses: Normal pulses.     Heart sounds: Normal heart sounds. No murmur heard. Pulmonary:     Effort: Pulmonary effort is normal. No respiratory distress.     Breath sounds: Normal breath sounds. No wheezing, rhonchi or rales.  Abdominal:     General: Bowel sounds are normal. There is no distension.     Palpations: Abdomen is soft. There is no mass.     Tenderness: There is no abdominal tenderness. There is no guarding or rebound.     Hernia: No hernia is present.  Musculoskeletal:     Cervical back: Normal range of motion and neck supple. No rigidity.     Right lower leg: No edema.     Left lower leg: No edema.  Lymphadenopathy:     Cervical: No cervical adenopathy.  Skin:    General: Skin is warm and dry.     Findings: Lesion present. No rash.     Comments: Irritated growth to L anterior chest wall below shoulder  Neurological:     General: No focal deficit present.     Mental Status: She is alert. Mental status is at baseline.  Psychiatric:        Mood and Affect: Mood normal.         Behavior: Behavior normal.       Results for orders placed or performed in visit on 10/30/21  Parathyroid hormone, intact (no Ca)  Result Value Ref Range   PTH 68 16 - 77 pg/mL  CBC with Differential/Platelet  Result Value Ref Range   WBC 6.0 4.0 - 10.5 K/uL   RBC 4.27 3.87 - 5.11 Mil/uL   Hemoglobin 13.5 12.0 - 15.0 g/dL   HCT 40.8 36.0 - 46.0 %   MCV 95.5 78.0 - 100.0 fl   MCHC 33.2 30.0 - 36.0 g/dL   RDW 12.6 11.5 - 15.5 %   Platelets 209.0 150.0 - 400.0 K/uL   Neutrophils Relative % 48.6 43.0 - 77.0 %   Lymphocytes Relative 35.4 12.0 - 46.0 %   Monocytes Relative 8.9 3.0 - 12.0 %   Eosinophils Relative 4.6 0.0 - 5.0 %   Basophils Relative 2.5 0.0 - 3.0 %   Neutro Abs 2.9 1.4 - 7.7 K/uL   Lymphs Abs 2.1 0.7 - 4.0 K/uL   Monocytes Absolute 0.5 0.1 - 1.0 K/uL   Eosinophils Absolute 0.3 0.0 - 0.7 K/uL   Basophils Absolute 0.2 (H) 0.0 - 0.1 K/uL  VITAMIN D 25 Hydroxy (Vit-D Deficiency, Fractures)  Result Value Ref Range   VITD 38.17 30.00 - 100.00 ng/mL  Microalbumin / creatinine urine ratio  Result Value Ref Range   Microalb, Ur 2.7 (H) 0.0 - 1.9 mg/dL   Creatinine,U 47.1 mg/dL   Microalb Creat Ratio 5.7 0.0 - 30.0 mg/g  Hemoglobin A1c  Result Value Ref Range   Hgb A1c MFr Bld 6.4 4.6 - 6.5 %  Comprehensive metabolic panel  Result Value Ref Range   Sodium 142 135 - 145 mEq/L   Potassium 4.3 3.5 - 5.1 mEq/L   Chloride 107 96 - 112 mEq/L   CO2 27 19 - 32 mEq/L   Glucose, Bld 116 (H) 70 - 99 mg/dL   BUN 20 6 - 23 mg/dL   Creatinine, Ser 0.98 0.40 - 1.20 mg/dL   Total Bilirubin 0.6 0.2 - 1.2 mg/dL   Alkaline Phosphatase 75 39 - 117 U/L   AST 22 0 - 37 U/L   ALT 19 0 - 35 U/L   Total Protein 7.0 6.0 - 8.3 g/dL   Albumin 4.1 3.5 - 5.2 g/dL   GFR 52.27 (L) >60.00 mL/min   Calcium 9.6 8.4 - 10.5 mg/dL  Lipid panel  Result Value Ref Range   Cholesterol 186 0 - 200 mg/dL   Triglycerides 160.0 (H) 0.0 - 149.0 mg/dL   HDL 58.20 >39.00 mg/dL   VLDL 32.0 0.0 -  40.0 mg/dL   LDL Cholesterol 96 0 - 99 mg/dL   Total CHOL/HDL Ratio 3    NonHDL 128.28     Assessment & Plan:   Problem List Items Addressed This Visit     Health maintenance examination - Primary (Chronic)    Preventative protocols reviewed and updated unless pt declined. Discussed healthy diet and lifestyle.       DNR (do not resuscitate) (Chronic)   History of breast cancer    Continue yearly mammogram in personal history of breast cancer.       Diabetes mellitus without complication (HCC)    Chronic, diet controlled.  Refilled One Touch Deluca lancets and strips.      Relevant Medications   OneTouch Delica Lancets 84O MISC   glucose blood (ONETOUCH ULTRA) test strip   lovastatin (MEVACOR) 40 MG tablet   White coat syndrome with diagnosis of hypertension    Blood pressures traditionally elevated in the office, run normal at home.  Home blood pressure cuff has previously been compared to ours and deemed accurate.  Patient states recent home blood pressures within normal range.  No medication changes made at this time.  Continue olmesartan 40 mg daily through cardiology.      Relevant Medications   lovastatin (MEVACOR) 40 MG tablet   Hyperlipidemia associated with type 2 diabetes mellitus (HCC)    Chronic, stable on lovastatin-continue this. The ASCVD Risk score (Arnett DK, et al., 2019) failed to calculate for the following reasons:   The 2019 ASCVD risk score is only valid for ages 68 to 49      Relevant Medications   lovastatin (MEVACOR) 40 MG tablet   CKD stage 3 due to type 2 diabetes mellitus (HCC)    Chronic, stable, continue good water intake, avoiding nephrotoxic agents.      Relevant Medications   lovastatin (MEVACOR) 40 MG tablet   Osteopenia of left forearm    She did have elevated fracture risk based on latest DEXA 2019. Reviewed calcium, vitamin D intake. Weightbearing activity limited by osteo arthritis. Given elevated fracture risk, will update  DEXA scan this year.      Relevant Orders   DG Bone Density   Chronic low back pain     Meds ordered this encounter  Medications   OneTouch Delica Lancets 96E MISC    Sig: Use as instructed to check blood sugar once daily.    Dispense:  100 each    Refill:  2   glucose blood (ONETOUCH ULTRA)  test strip    Sig: Use as instructed to check blood sugar once daily.    Dispense:  100 each    Refill:  2   Calcium Carb-Cholecalciferol (CALTRATE 600+D3) 600-20 MG-MCG TABS    Sig: Take 1 tablet by mouth daily.   lovastatin (MEVACOR) 40 MG tablet    Sig: Take 1 tablet (40 mg total) by mouth daily.    Dispense:  90 tablet    Refill:  3   Orders Placed This Encounter  Procedures   DG Bone Density    Standing Status:   Future    Standing Expiration Date:   11/13/2022    Order Specific Question:   Reason for Exam (SYMPTOM  OR DIAGNOSIS REQUIRED)    Answer:   osteopenia f/u (increased fracture risk)    Order Specific Question:   Preferred imaging location?    Answer:   [facility]    Patient instructions: Call to to schedule bone density scan: [facility] at [facility] [phone] If interested, check with pharmacy about new 2 shot shingles series (shingrix).  Good to see you today  Return as needed or in 1 year for next physical  Follow up plan: Return in about 1 year (around 11/13/2022) for annual exam, prior fasting for blood work, medicare wellness visit.  [name], MD

## 2021-11-12 NOTE — Assessment & Plan Note (Signed)
Chronic, stable on lovastatin-continue this. The ASCVD Risk score (Arnett DK, et al., 2019) failed to calculate for the following reasons:   The 2019 ASCVD risk score is only valid for ages 38 to 30

## 2021-11-12 NOTE — Assessment & Plan Note (Signed)
Chronic, stable, continue good water intake, avoiding nephrotoxic agents.

## 2021-11-12 NOTE — Assessment & Plan Note (Signed)
Continue yearly mammogram in personal history of breast cancer.

## 2021-11-12 NOTE — Assessment & Plan Note (Signed)
She did have elevated fracture risk based on latest DEXA 2019. Reviewed calcium, vitamin D intake. Weightbearing activity limited by osteo arthritis. Given elevated fracture risk, will update DEXA scan this year.

## 2021-11-12 NOTE — Assessment & Plan Note (Addendum)
Blood pressures traditionally elevated in the office, run normal at home.  Home blood pressure cuff has previously been compared to ours and deemed accurate.  Patient states recent home blood pressures within normal range.  No medication changes made at this time.  Continue olmesartan 40 mg daily through cardiology.

## 2021-11-13 DIAGNOSIS — C50212 Malignant neoplasm of upper-inner quadrant of left female breast: Secondary | ICD-10-CM | POA: Diagnosis not present

## 2021-11-14 DIAGNOSIS — M1612 Unilateral primary osteoarthritis, left hip: Secondary | ICD-10-CM | POA: Diagnosis not present

## 2021-11-27 ENCOUNTER — Ambulatory Visit
Admission: RE | Admit: 2021-11-27 | Discharge: 2021-11-27 | Disposition: A | Discharge status: Home / Self Care | Payer: PPO | Source: Ambulatory Visit | Attending: General Surgery | Admitting: General Surgery

## 2021-11-27 DIAGNOSIS — Z1231 Encounter for screening mammogram for malignant neoplasm of breast: Secondary | ICD-10-CM | POA: Insufficient documentation

## 2021-11-28 ENCOUNTER — Other Ambulatory Visit: Payer: Self-pay | Admitting: General Surgery

## 2021-11-28 DIAGNOSIS — R928 Other abnormal and inconclusive findings on diagnostic imaging of breast: Secondary | ICD-10-CM

## 2021-11-28 DIAGNOSIS — N6489 Other specified disorders of breast: Secondary | ICD-10-CM

## 2021-12-01 DIAGNOSIS — M19012 Primary osteoarthritis, left shoulder: Secondary | ICD-10-CM | POA: Diagnosis not present

## 2021-12-01 DIAGNOSIS — M7061 Trochanteric bursitis, right hip: Secondary | ICD-10-CM | POA: Diagnosis not present

## 2021-12-01 DIAGNOSIS — M1612 Unilateral primary osteoarthritis, left hip: Secondary | ICD-10-CM | POA: Diagnosis not present

## 2021-12-01 DIAGNOSIS — M7582 Other shoulder lesions, left shoulder: Secondary | ICD-10-CM | POA: Diagnosis not present

## 2021-12-09 ENCOUNTER — Ambulatory Visit
Admission: RE | Admit: 2021-12-09 | Discharge: 2021-12-09 | Disposition: A | Discharge status: Home / Self Care | Payer: PPO | Source: Ambulatory Visit | Attending: General Surgery | Admitting: General Surgery

## 2021-12-09 DIAGNOSIS — N6489 Other specified disorders of breast: Secondary | ICD-10-CM | POA: Diagnosis not present

## 2021-12-09 DIAGNOSIS — R928 Other abnormal and inconclusive findings on diagnostic imaging of breast: Secondary | ICD-10-CM | POA: Diagnosis not present

## 2021-12-14 ENCOUNTER — Other Ambulatory Visit: Payer: Self-pay | Admitting: Family Medicine

## 2021-12-15 NOTE — Telephone Encounter (Signed)
Too soon.  Rx sent 11/12/21, #90/3.

## 2021-12-16 ENCOUNTER — Ambulatory Visit (INDEPENDENT_AMBULATORY_CARE_PROVIDER_SITE_OTHER): Payer: PPO

## 2021-12-16 ENCOUNTER — Telehealth: Payer: Self-pay | Admitting: Family Medicine

## 2021-12-16 ENCOUNTER — Other Ambulatory Visit: Payer: Self-pay | Admitting: Family Medicine

## 2021-12-16 DIAGNOSIS — Z23 Encounter for immunization: Secondary | ICD-10-CM

## 2021-12-16 DIAGNOSIS — E1169 Type 2 diabetes mellitus with other specified complication: Secondary | ICD-10-CM

## 2021-12-16 MED ORDER — LOVASTATIN 40 MG PO TABS: 40.0000 mg | ORAL_TABLET | Freq: Every day | ORAL | 3 refills | Status: DC

## 2021-12-16 NOTE — Telephone Encounter (Signed)
According to pt's chart, x sent 11/12/21, #90/3 to [facility] [address]  Spoke with [facility] asking about rx.  States they are not showing they received it.   E-scribed refill today.  Spoke with pt notifying her rx was sent in today.  She expresses her thanks.

## 2021-12-16 NOTE — Telephone Encounter (Signed)
  Encourage patient to contact the pharmacy for refills or they can request refills through [facility]  Did the patient contact the pharmacy:  yes   LAST APPOINTMENT DATE:  Please schedule appointment if longer than 1 year  NEXT APPOINTMENT DATE:  MEDICATION:lovastatin (MEVACOR) 40 MG tablet  Is the patient out of medication? no  If not, how much is left? 13 pills left  Is this a 90 day supply: yes  PHARMACY: [id number] - [address], [address] AT [address] Phone:  [phone]  Fax:  [phone]      Let patient know to contact pharmacy at the end of the day to make sure medication is ready.  Please notify patient to allow 48-72 hours to process

## 2021-12-16 NOTE — Telephone Encounter (Signed)
Too soon.  Rx sent 11/12/21, #90/3 to [facility] [address]

## 2021-12-18 ENCOUNTER — Ambulatory Visit
Admission: RE | Admit: 2021-12-18 | Discharge: 2021-12-18 | Disposition: A | Discharge status: Home / Self Care | Payer: PPO | Source: Ambulatory Visit | Attending: Family Medicine | Admitting: Family Medicine

## 2021-12-18 DIAGNOSIS — M85832 Other specified disorders of bone density and structure, left forearm: Secondary | ICD-10-CM | POA: Diagnosis not present

## 2021-12-18 DIAGNOSIS — M8589 Other specified disorders of bone density and structure, multiple sites: Secondary | ICD-10-CM | POA: Diagnosis not present

## 2021-12-18 DIAGNOSIS — Z780 Asymptomatic menopausal state: Secondary | ICD-10-CM | POA: Diagnosis not present

## 2022-01-07 ENCOUNTER — Telehealth: Payer: Self-pay

## 2022-01-07 NOTE — Progress Notes (Signed)
Chronic Care Management Pharmacy Assistant   Name: Paige Medina  MRN: [id number] DOB: 02/26/1934  Reason for Encounter: CCM (Hypertension Disease State)  Recent office visits:  12/16/21 [name] high dose 65+ 11/12/21 [name], MD Annual Exam Ordered: Bone Density Start: Calcium Carb-Cholecalciferol (CALTRATE 600+D3) 600-20 MG-MCG TABS  11/06/21 AWV  Recent consult visits:  12/18/21 DEXA Scan  12/09/21 Abnormal findings - repeat Mammogram 12/01/21 [name], PA (Phys Med) Greater trochanteric bursitis of right hip Procedures: repeat left subacromial steroid injection and repeat right greater trochanteric bursa injection. FU 3 months 11/27/21 Mammogram 11/05/21 [name] (Phys Med)Primary osteoarthritis of left hip Procedure: Left hip joint injection  11/05/21 [name] (Phys Med) Lumbar stenosis with neurogenic claudication  10/23/21 [name] (Phys Med) Lumbar stenosis with neurogenic claudication  Procedure: Bilateral L5-S1 transforaminal epidural injections  10/14/21 [name] (Phys Med) Chronic left hip pain No other information 09/18/21 [name], MD (Cardiology) Frequent PVCs Ordered: EKG Stop: Ivermectin 1 % CREA FU 1 year 08/14/21 [name] (Ophthalmology) Vitreous degeneration, bilateral  07/31/21 [name], MD (General Surgery) Malignant neoplasm of upper-inner quadrant of left breast  FU 1 year  Hospital visits:  None in previous 6 months  Medications: Outpatient Encounter Medications as of 01/07/2022  Medication Sig   b complex vitamins capsule Take 1 capsule by mouth daily.   beta carotene w/minerals (OCUVITE) tablet Take 1 tablet by mouth every other day.   Blood Glucose Monitoring Suppl (ONE TOUCH ULTRA 2) w/Device KIT Use as directed   Boswellia-Glucosamine-Vit D (OSTEO BI-FLEX-GLUCOS/5-LOXIN) TABS Take 1 tablet by mouth daily.   Calcium Carb-Cholecalciferol (CALTRATE 600+D3) 600-20 MG-MCG TABS Take 1  tablet by mouth daily.   Cholecalciferol 125 MCG (5000 UT) TABS Take 1 tablet by mouth every other day.   clobetasol (TEMOVATE) 0.05 % external solution APPLY A SMALL AMOUNT TO AFFECTED AREA ONCE A DAY UP TO 5 TIMES PER WEEK   Coenzyme Q10 (CO Q-10) 200 MG CAPS Take 200 mg by mouth every morning.    docusate sodium (COLACE) 100 MG capsule Take 100 mg by mouth 2 (two) times daily.   doxycycline (VIBRAMYCIN) 100 MG capsule Take 100 mg by mouth daily.   GENTEAL SEVERE 0.3 % GEL ophthalmic ointment    glucose blood (ONETOUCH ULTRA) test strip Use as instructed to check blood sugar once daily.   lovastatin (MEVACOR) 40 MG tablet Take 1 tablet (40 mg total) by mouth daily.   metroNIDAZOLE (METROGEL) 0.75 % gel APPLY SMALL AMOUNT EXTERNALLY TO THE AFFECTED AREA 1 TO 2 TIMES DAILY AS DIRECTED   olmesartan (BENICAR) 40 MG tablet TAKE 1 TABLET BY MOUTH EVERY DAY   OneTouch Delica Lancets 16W MISC Use as instructed to check blood sugar once daily.   Probiotic Product (PROBIOTIC DAILY PO) Take 1 capsule by mouth at bedtime.    triamcinolone cream (KENALOG) 0.1 % Apply 1 application topically 2 (two) times daily as needed.   vitamin C (ASCORBIC ACID) 500 MG tablet Take 500 mg by mouth daily.   No facility-administered encounter medications on file as of 01/07/2022.    Recent Office Vitals: BP Readings from Last 3 Encounters:  11/12/21 128/72  09/18/21 (!) 160/70  11/05/20 140/68   Pulse Readings from Last 3 Encounters:  11/12/21 (!) 58  09/18/21 60  11/05/20 70    Wt Readings from Last 3 Encounters:  11/12/21 138 lb 8 oz (62.8 kg)  11/06/21 137 lb (62.1 kg)  09/18/21 137 lb (62.1  kg)     Kidney Function Lab Results  Component Value Date/Time   CREATININE 0.98 10/30/2021 07:41 AM   CREATININE 0.98 10/29/2020 08:05 AM   CREATININE 1.09 (H) 10/05/2016 11:03 AM   CREATININE 1.10 03/15/2013 12:00 AM   GFR 52.27 (L) 10/30/2021 07:41 AM   GFRNONAA 48 (L) 08/10/2017 01:13 AM   GFRAA 55 (L)  08/10/2017 01:13 AM       Latest Ref Rng & Units 10/30/2021    7:41 AM 10/29/2020    8:05 AM 10/25/2019    8:10 AM  BMP  Glucose 70 - 99 mg/dL 116  104  130   BUN 6 - 23 mg/dL _0 Creatinine 0.40 - 1.20 mg/dL 0.98  0.98  1.11   Sodium 135 - 145 mEq/L 142  142  143   Potassium 3.5 - 5.1 mEq/L 4.3  4.4  4.7   Chloride 96 - 112 mEq/L 107  105  105   CO2 19 - 32 mEq/L _1 Calcium 8.4 - 10.5 mg/dL 9.6  10.0  10.0     Attempted contact with patient 3 times. Unsuccessful outreach. Will atttempt contact next month.  Current antihypertensive regimen:  Olmesartan 40 mg daily PM   Adherence Review: Is the patient currently on ACE/ARB medication? Yes Does the patient have >5 day gap between last estimated fill dates? No  Star Rating Drugs:  Medication:  Last Fill: Day Supply Lovastatin 40 mg 12/16/2021 90 Olmesartan 40 mg 12/14/2021 90  Summary of recommendations from last [place] visit (Date:06/25/2021)  Summary: CCM F/U visit -BP much improved over the last few weeks - 120/77 today, on average < 120/80 per patient report.   Recommendations/Changes made from today's visit: -No med changes   Plan: -[facility] will call patient 7 months for BP update -Pharmacist follow up televisit scheduled for 1 year -PCP CPE 11/07/21   Care Gaps: Annual wellness visit in last year? Yes 11/06/2021 Most Recent BP reading: 128/72 on 11/12/2021  If Diabetic: Most recent A1C reading: 6.4 on 10/30/2021 Last eye exam / retinopathy screening: 08/09/2020 Last diabetic foot exam: 04/30/2020  Upcoming appointments: CCM appointment on 06/24/2022  Charlene Brooke, CPP notified  Marijean Niemann, [place] Assistant [phone]

## 2022-02-05 ENCOUNTER — Telehealth: Payer: Self-pay

## 2022-02-05 NOTE — Progress Notes (Signed)
Chronic Care Management Pharmacy Assistant   Name: Paige Medina  MRN: [id number] DOB: 11/21/1933  Reason for Encounter: CCM (Hypertension Disease State)  Recent office visits:  12/16/21 Paige Medina high dose 65+ 11/12/21 Paige Bush, MD Annual Exam Ordered: Bone Density Start: Calcium Carb-Cholecalciferol (CALTRATE 600+D3) 600-20 MG-MCG TABS  11/06/21 AWV   Recent consult visits:  12/18/21 DEXA Scan  12/09/21 Abnormal findings - repeat Mammogram 12/01/21 Paige Hippo, PA (Phys Med) Greater trochanteric bursitis of right hip Procedures: repeat left subacromial steroid injection and repeat right greater trochanteric bursa injection. FU 3 months 11/27/21 Mammogram 11/05/21 Paige Medina (Phys Med)Primary osteoarthritis of left hip Procedure: Left hip joint injection  11/05/21 Paige Medina (Phys Med) Lumbar stenosis with neurogenic claudication  10/23/21 Paige Medina (Phys Med) Lumbar stenosis with neurogenic claudication  Procedure: Bilateral L5-S1 transforaminal epidural injections  10/14/21 Paige Medina (Phys Med) Chronic left hip pain No other information 09/18/21 Paige Sacramento, MD (Cardiology) Frequent PVCs Ordered: EKG Stop: Ivermectin 1 % CREA FU 1 year 08/14/21 Paige Medina (Ophthalmology) Vitreous degeneration, bilateral  07/31/21 Paige Messing, MD (General Surgery) Malignant neoplasm of upper-inner quadrant of left breast  FU 1 year   Hospital visits:  None in previous 6 months  Medications: Outpatient Encounter Medications as of 02/05/2022  Medication Sig   b complex vitamins capsule Take 1 capsule by mouth daily.   beta carotene w/minerals (OCUVITE) tablet Take 1 tablet by mouth every other day.   Blood Glucose Monitoring Suppl (ONE TOUCH ULTRA 2) w/Device KIT Use as directed   Boswellia-Glucosamine-Vit D (OSTEO BI-FLEX-GLUCOS/5-LOXIN) TABS Take 1 tablet by mouth daily.   Calcium Carb-Cholecalciferol (CALTRATE 600+D3) 600-20 MG-MCG TABS Take 1  tablet by mouth daily.   Cholecalciferol 125 MCG (5000 UT) TABS Take 1 tablet by mouth every other day.   clobetasol (TEMOVATE) 0.05 % external solution APPLY A SMALL AMOUNT TO AFFECTED AREA ONCE A DAY UP TO 5 TIMES PER WEEK   Coenzyme Q10 (CO Q-10) 200 MG CAPS Take 200 mg by mouth every morning.    docusate sodium (COLACE) 100 MG capsule Take 100 mg by mouth 2 (two) times daily.   doxycycline (VIBRAMYCIN) 100 MG capsule Take 100 mg by mouth daily.   GENTEAL SEVERE 0.3 % GEL ophthalmic ointment    glucose blood (ONETOUCH ULTRA) test strip Use as instructed to check blood sugar once daily.   lovastatin (MEVACOR) 40 MG tablet Take 1 tablet (40 mg total) by mouth daily.   metroNIDAZOLE (METROGEL) 0.75 % gel APPLY SMALL AMOUNT EXTERNALLY TO THE AFFECTED AREA 1 TO 2 TIMES DAILY AS DIRECTED   olmesartan (BENICAR) 40 MG tablet TAKE 1 TABLET BY MOUTH EVERY DAY   OneTouch Delica Lancets 30N MISC Use as instructed to check blood sugar once daily.   Probiotic Product (PROBIOTIC DAILY PO) Take 1 capsule by mouth at bedtime.    triamcinolone cream (KENALOG) 0.1 % Apply 1 application topically 2 (two) times daily as needed.   vitamin C (ASCORBIC ACID) 500 MG tablet Take 500 mg by mouth daily.   No facility-administered encounter medications on file as of 02/05/2022.    Recent Office Vitals: BP Readings from Last 3 Encounters:  11/12/21 128/72  09/18/21 (!) 160/70  11/05/20 140/68   Pulse Readings from Last 3 Encounters:  11/12/21 (!) 58  09/18/21 60  11/05/20 70    Wt Readings from Last 3 Encounters:  11/12/21 138 lb 8 oz (62.8 kg)  11/06/21 137 lb (62.1 kg)  09/18/21 137  lb (62.1 kg)     Kidney Function Lab Results  Component Value Date/Time   CREATININE 0.98 10/30/2021 07:41 AM   CREATININE 0.98 10/29/2020 08:05 AM   CREATININE 1.09 (H) 10/05/2016 11:03 AM   CREATININE 1.10 03/15/2013 12:00 AM   GFR 52.27 (L) 10/30/2021 07:41 AM   GFRNONAA 48 (L) 08/10/2017 01:13 AM   GFRAA 55 (L)  08/10/2017 01:13 AM       Latest Ref Rng & Units 10/30/2021    7:41 AM 10/29/2020    8:05 AM 10/25/2019    8:10 AM  BMP  Glucose 70 - 99 mg/dL 116  104  130   BUN 6 - 23 mg/dL _0 Creatinine 0.40 - 1.20 mg/dL 0.98  0.98  1.11   Sodium 135 - 145 mEq/L 142  142  143   Potassium 3.5 - 5.1 mEq/L 4.3  4.4  4.7   Chloride 96 - 112 mEq/L 107  105  105   CO2 19 - 32 mEq/L _1 Calcium 8.4 - 10.5 mg/dL 9.6  10.0  10.0      Contacted patient on 02/10/2022 to discuss hypertension disease state  Current antihypertensive regimen:  Olmesartan 40 mg daily PM   Patient verbally confirms she is taking the above medications as directed. Yes  How often are you checking your Blood Pressure? weekly  she checks her blood pressure in the morning after taking her medication.  Current home BP readings: Patient stated her readings have been really good. Her husband takes it for her. Last week it was 120/72 with pulse of 56.  Wrist or arm cuff: Arm cuff Caffeine intake: 1 cup decaf coffee  Salt intake: Does not add salt Over the counter medications including pseudoephedrine or NSAIDs? Tylenol (650 mg) - 1 in morning; 1 at night  Any readings above 180/120? No  What recent interventions/DTPs have been made by any provider to improve Blood Pressure control since last CPP Visit: No recent interventions  Any recent hospitalizations or ED visits since last visit with CPP? No  What diet changes have been made to improve Blood Pressure Control?  Patient watches her salt and sugar and she eats healthy/balanced meals.   What exercise is being done to improve your Blood Pressure Control?  Patient will go three times a week to [facility] gym  Adherence Review: Is the patient currently on ACE/ARB medication? Yes Does the patient have >5 day gap between last estimated fill dates? No  Star Rating Drugs:  Medication:                Last Fill:         Day Supply Lovastatin 40 mg         12/16/2021      90 Olmesartan 40 mg      12/14/2021      90   Summary of recommendations from last [place] visit (Date:06/25/2021)   Summary: CCM F/U visit -BP much improved over the last few weeks - 120/77 today, on average < 120/80 per patient report.   Recommendations/Changes made from today's visit: -No med changes   Plan: -[place] will call patient 7 months for BP update -Pharmacist follow up televisit scheduled for 1 year -PCP CPE 11/07/21   Care Gaps: Annual wellness visit in last year? Yes 11/06/2021 Most Recent BP reading: 128/72 on 11/12/2021   If Diabetic: Most recent A1C reading: 6.4  on 10/30/2021 Last eye exam / retinopathy screening: 08/09/2020 Last diabetic foot exam: 04/30/2020   Upcoming appointments: CCM appointment on 06/24/2022   Charlene Brooke, CPP notified   Marijean Niemann, [place] Assistant [phone]

## 2022-03-14 ENCOUNTER — Other Ambulatory Visit: Payer: Self-pay | Admitting: Cardiovascular Disease

## 2022-06-19 ENCOUNTER — Ambulatory Visit: Payer: PPO | Admitting: Student

## 2022-06-19 ENCOUNTER — Encounter: Payer: Self-pay | Admitting: Student

## 2022-06-19 VITALS — BP 150/72 | HR 60 | Temp 98.0°F | Ht 65.0 in | Wt 138.0 lb

## 2022-06-19 DIAGNOSIS — I10 Essential (primary) hypertension: Secondary | ICD-10-CM | POA: Diagnosis not present

## 2022-06-19 MED ORDER — LABETALOL HCL 100 MG PO TABS: 100.0000 mg | ORAL_TABLET | Freq: Every day | ORAL | 0 refills | Status: DC

## 2022-06-19 NOTE — Patient Instructions (Signed)
Please take one tablet daily. Follow up with your PCP. Continue to take your BP daily. Sit for 5 minutes with feet flat on the ground before taking.
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
## 2022-06-23 ENCOUNTER — Other Ambulatory Visit: Payer: Self-pay | Admitting: Family Medicine

## 2022-06-23 ENCOUNTER — Ambulatory Visit (INDEPENDENT_AMBULATORY_CARE_PROVIDER_SITE_OTHER): Payer: PPO | Admitting: Family Medicine

## 2022-06-23 ENCOUNTER — Encounter: Payer: Self-pay | Admitting: Family Medicine

## 2022-06-23 VITALS — BP 180/78 | HR 58 | Temp 97.5°F | Ht 65.0 in | Wt 136.2 lb

## 2022-06-23 DIAGNOSIS — I10 Essential (primary) hypertension: Secondary | ICD-10-CM

## 2022-06-23 DIAGNOSIS — E119 Type 2 diabetes mellitus without complications: Secondary | ICD-10-CM | POA: Diagnosis not present

## 2022-06-23 DIAGNOSIS — E1122 Type 2 diabetes mellitus with diabetic chronic kidney disease: Secondary | ICD-10-CM

## 2022-06-23 DIAGNOSIS — M545 Low back pain, unspecified: Secondary | ICD-10-CM

## 2022-06-23 DIAGNOSIS — G8929 Other chronic pain: Secondary | ICD-10-CM

## 2022-06-23 DIAGNOSIS — N183 Chronic kidney disease, stage 3 unspecified: Secondary | ICD-10-CM | POA: Diagnosis not present

## 2022-06-23 MED ORDER — HYDRALAZINE HCL 25 MG PO TABS: 25.0000 mg | ORAL_TABLET | Freq: Two times a day (BID) | ORAL | 1 refills | Status: DC

## 2022-06-23 NOTE — Assessment & Plan Note (Addendum)
Chronic, recently deteriorated in setting of increased back pain She continues olmesartan 40 mg daily. Recently started on labetalol 1000 mg daily, no significant change to blood pressure on this regimen. Will stop labetalol, start hydralazine 25 mg twice daily, I asked her to monitor blood pressures at home and bring me readings to review. Update lab work today. I did ask her to return in 4 weeks for a follow-up visit.

## 2022-06-23 NOTE — Patient Instructions (Addendum)
Update labs today.  Continue olmesartan  daily.  Start hydralazine  twice daily in place labetalol.  Return in 4 weeks for follow up visit

## 2022-06-23 NOTE — Assessment & Plan Note (Signed)
Chronic pain is deteriorated.  This likely contributes to worsening blood pressures.  She will continue follow-up with Dr Yves Dill PM&R/.

## 2022-06-23 NOTE — Progress Notes (Addendum)
Ph: [phone]       Fax: [phone]   Patient ID: Paige Medina, female    DOB: 1933-04-21, 89 y.o.   MRN: [id number]  This visit was conducted in person.  BP (!) 180/78   Pulse (!) 58   Temp (!) 97.5 F (36.4 C) (Temporal)   Ht 5\' 5"  (1.651 m)   Wt 136 lb 4 oz (61.8 kg)   SpO2 98%   BMI 22.67 kg/m   BP Readings from Last 3 Encounters:  06/23/22 (!) 180/78  06/19/22 (!) 150/72  11/12/21 128/72   190/76 on retesting  CC: elevated BP readings recently  Subjective:   HPI: Paige Medina is a 89 y.o. female presenting on 06/23/2022 for Medical Management of Chronic Issues (Here due to recent elevated BP readings at [facility]. Pt accompanied by husband.)   HTN - Compliant with current antihypertensive regimen of olmesartan 40mg  daily. Does check blood pressures at home: she forgot home blood pressure log, but they have been running high up to 185/90. Lately BP running high at home as well. Has component of white coat hypertension. Denies low blood pressure readings or symptoms of dizziness/syncope. Denies HA, vision changes, CP/tightness, SOB, leg swelling.   She has a list of medications previously tried and their intolerances, she forgot this at home. She does not want to try amlodipine - previously dropped BP too low and caused constipation.   She was seen by [facility] last week for elevated BP readings, started on labetalol 100mg  daily.   Chronic pain contributing to elevated blood pressures - saw PM&R yesterday, note reviewed. Known spinal stenosis, bulging disc, worsening back pain recently. Last spine injection 1 month ago, planning to repeat L L5/S1 and L S1 TF ESI. She is now walking with a cane. R leg can give out. Current pain level is 5/10.   She takes tylenol 500mg  BID. She's not taking NSAIDs.      Relevant past medical, surgical, family and social history reviewed and updated as indicated. Interim medical history since our last visit  reviewed. Allergies and medications reviewed and updated. Outpatient Medications Prior to Visit  Medication Sig Dispense Refill   b complex vitamins capsule Take 1 capsule by mouth daily.     beta carotene w/minerals (OCUVITE) tablet Take 1 tablet by mouth every other day.     Blood Glucose Monitoring Suppl (ONE TOUCH ULTRA 2) w/Device KIT Use as directed 1 each 0   Boswellia-Glucosamine-Vit D (OSTEO BI-FLEX-GLUCOS/5-LOXIN) TABS Take 1 tablet by mouth daily.     Calcium Carb-Cholecalciferol (CALTRATE 600+D3) 600-20 MG-MCG TABS Take 1 tablet by mouth daily.     Cholecalciferol 125 MCG (5000 UT) TABS Take 1 tablet by mouth every other day.     clobetasol (TEMOVATE) 0.05 % external solution APPLY A SMALL AMOUNT TO AFFECTED AREA ONCE A DAY UP TO 5 TIMES PER WEEK 50 mL 3   Coenzyme Q10 (CO Q-10) 200 MG CAPS Take 200 mg by mouth every morning.      docusate sodium (COLACE) 100 MG capsule Take 100 mg by mouth 2 (two) times daily.     doxycycline (VIBRAMYCIN) 100 MG capsule Take 100 mg by mouth daily.     GENTEAL SEVERE 0.3 % GEL ophthalmic ointment      glucose blood (ONETOUCH ULTRA) test strip Use as instructed to check blood sugar once daily. 100 each 2   lovastatin (MEVACOR) 40 MG tablet Take 1 tablet (40 mg  total) by mouth daily. 90 tablet 3   metroNIDAZOLE (METROGEL) 0.75 % gel APPLY SMALL AMOUNT EXTERNALLY TO THE AFFECTED AREA 1 TO 2 TIMES DAILY AS DIRECTED 45 g 11   olmesartan (BENICAR) 40 MG tablet TAKE 1 TABLET BY MOUTH EVERY DAY 90 tablet 1   OneTouch Delica Lancets 33G MISC Use as instructed to check blood sugar once daily. 100 each 2   Probiotic Product (PROBIOTIC DAILY PO) Take 1 capsule by mouth at bedtime.      triamcinolone cream (KENALOG) 0.1 % Apply 1 application topically 2 (two) times daily as needed. 30 g 1   labetalol (NORMODYNE) 100 MG tablet Take 1 tablet (100 mg total) by mouth daily for 30 doses. 30 tablet 0   vitamin C (ASCORBIC ACID) 500 MG tablet Take 500 mg by mouth  daily.     No facility-administered medications prior to visit.     Per HPI unless specifically indicated in ROS section below Review of Systems  Objective:  BP (!) 180/78   Pulse (!) 58   Temp (!) 97.5 F (36.4 C) (Temporal)   Ht 5\' 5"  (1.651 m)   Wt 136 lb 4 oz (61.8 kg)   SpO2 98%   BMI 22.67 kg/m   Wt Readings from Last 3 Encounters:  06/23/22 136 lb 4 oz (61.8 kg)  06/19/22 138 lb (62.6 kg)  11/12/21 138 lb 8 oz (62.8 kg)      Physical Exam Vitals and nursing note reviewed.  Constitutional:      Appearance: Normal appearance. She is not ill-appearing.  HENT:     Mouth/Throat:     Mouth: Mucous membranes are moist.     Pharynx: Oropharynx is clear. No oropharyngeal exudate or posterior oropharyngeal erythema.  Eyes:     Extraocular Movements: Extraocular movements intact.     Pupils: Pupils are equal, round, and reactive to light.  Neck:     Thyroid: No thyroid mass or thyromegaly.  Cardiovascular:     Rate and Rhythm: Normal rate and regular rhythm.     Pulses: Normal pulses.     Heart sounds: Normal heart sounds. No murmur heard. Pulmonary:     Effort: Pulmonary effort is normal. No respiratory distress.     Breath sounds: Normal breath sounds. No wheezing, rhonchi or rales.  Abdominal:     General: Bowel sounds are normal. There is no distension.     Palpations: Abdomen is soft. There is no mass.     Tenderness: There is no abdominal tenderness. There is no guarding or rebound.     Hernia: No hernia is present.     Comments: No bruit  Musculoskeletal:     Right lower leg: No edema.     Left lower leg: No edema.  Skin:    General: Skin is warm and dry.     Findings: No rash.  Neurological:     Mental Status: She is alert.  Psychiatric:        Mood and Affect: Mood normal.        Behavior: Behavior normal.       Results for orders placed or performed in visit on 06/23/22  Comprehensive metabolic panel  Result Value Ref Range   Sodium 143 135 -  145 mEq/L   Potassium 4.4 3.5 - 5.1 mEq/L   Chloride 104 96 - 112 mEq/L   CO2 30 19 - 32 mEq/L   Glucose, Bld 104 (H) 70 - 99 mg/dL   BUN  20 6 - 23 mg/dL   Creatinine, Ser 9.60 0.40 - 1.20 mg/dL   Total Bilirubin 0.5 0.2 - 1.2 mg/dL   Alkaline Phosphatase 72 39 - 117 U/L   AST 17 0 - 37 U/L   ALT 15 0 - 35 U/L   Total Protein 6.5 6.0 - 8.3 g/dL   Albumin 4.1 3.5 - 5.2 g/dL   GFR 45.40 (L) >98.11 mL/min   Calcium 9.6 8.4 - 10.5 mg/dL  TSH  Result Value Ref Range   TSH 1.82 0.35 - 5.50 uIU/mL  Microalbumin / creatinine urine ratio  Result Value Ref Range   Microalb, Ur 2.1 (H) 0.0 - 1.9 mg/dL   Creatinine,U 91.4 mg/dL   Microalb Creat Ratio 7.2 0.0 - 30.0 mg/g  CBC with Differential/Platelet  Result Value Ref Range   WBC 7.5 4.0 - 10.5 K/uL   RBC 4.18 3.87 - 5.11 Mil/uL   Hemoglobin 13.0 12.0 - 15.0 g/dL   HCT 78.2 95.6 - 21.3 %   MCV 95.5 78.0 - 100.0 fl   MCHC 32.5 30.0 - 36.0 g/dL   RDW 08.6 57.8 - 46.9 %   Platelets 216.0 150.0 - 400.0 K/uL   Neutrophils Relative % 57.5 43.0 - 77.0 %   Lymphocytes Relative 28.7 12.0 - 46.0 %   Monocytes Relative 9.0 3.0 - 12.0 %   Eosinophils Relative 3.8 0.0 - 5.0 %   Basophils Relative 1.0 0.0 - 3.0 %   Neutro Abs 4.3 1.4 - 7.7 K/uL   Lymphs Abs 2.2 0.7 - 4.0 K/uL   Monocytes Absolute 0.7 0.1 - 1.0 K/uL   Eosinophils Absolute 0.3 0.0 - 0.7 K/uL   Basophils Absolute 0.1 0.0 - 0.1 K/uL    Assessment & Plan:   Problem List Items Addressed This Visit     White coat syndrome with diagnosis of hypertension - Primary    Chronic, recently deteriorated in setting of increased back pain She continues olmesartan 40 mg daily. Recently started on labetalol 1000 mg daily, no significant change to blood pressure on this regimen. Will stop labetalol, start hydralazine 25 mg twice daily, I asked her to monitor blood pressures at home and bring me readings to review. Update lab work today. I did ask her to return in 4 weeks for a follow-up  visit.      Relevant Medications   hydrALAZINE (APRESOLINE) 25 MG tablet   Other Relevant Orders   Comprehensive metabolic panel (Completed)   TSH (Completed)   Microalbumin / creatinine urine ratio (Completed)   CBC with Differential/Platelet (Completed)   CKD stage 3 due to type 2 diabetes mellitus (HCC)   Relevant Orders   Comprehensive metabolic panel (Completed)   TSH (Completed)   Microalbumin / creatinine urine ratio (Completed)   CBC with Differential/Platelet (Completed)   Chronic low back pain    Chronic pain is deteriorated.  This likely contributes to worsening blood pressures.  She will continue follow-up with Dr Yves Dill PM&R/.         Meds ordered this encounter  Medications   hydrALAZINE (APRESOLINE) 25 MG tablet    Sig: Take 1 tablet (25 mg total) by mouth in the morning and at bedtime.    Dispense:  60 tablet    Refill:  1    Orders Placed This Encounter  Procedures   Comprehensive metabolic panel   TSH   Microalbumin / creatinine urine ratio   CBC with Differential/Platelet    Patient Instructions  Update labs  today.  Continue olmesartan 40mg  daily.  Start hydralazine 25mg  twice daily in place labetalol.  Return in 4 weeks for follow up visit   Follow up plan: Return in about 1 month (around 07/23/2022), or if symptoms worsen or fail to improve, for follow up visit.  Eustaquio Boyden, MD

## 2022-06-24 ENCOUNTER — Encounter: Payer: Self-pay | Admitting: Pharmacist

## 2022-06-24 LAB — COMPREHENSIVE METABOLIC PANEL WITH GFR
ALT: 15 U/L (ref 0–35)
AST: 17 U/L (ref 0–37)
Albumin: 4.1 g/dL (ref 3.5–5.2)
Alkaline Phosphatase: 72 U/L (ref 39–117)
BUN: 20 mg/dL (ref 6–23)
CO2: 30 meq/L (ref 19–32)
Calcium: 9.6 mg/dL (ref 8.4–10.5)
Chloride: 104 meq/L (ref 96–112)
Creatinine, Ser: 0.96 mg/dL (ref 0.40–1.20)
GFR: 53.33 mL/min — ABNORMAL LOW
Glucose, Bld: 104 mg/dL — ABNORMAL HIGH (ref 70–99)
Potassium: 4.4 meq/L (ref 3.5–5.1)
Sodium: 143 meq/L (ref 135–145)
Total Bilirubin: 0.5 mg/dL (ref 0.2–1.2)
Total Protein: 6.5 g/dL (ref 6.0–8.3)

## 2022-06-24 LAB — CBC WITH DIFFERENTIAL/PLATELET
Basophils Absolute: 0.1 10*3/uL (ref 0.0–0.1)
Basophils Relative: 1 % (ref 0.0–3.0)
Eosinophils Absolute: 0.3 10*3/uL (ref 0.0–0.7)
Eosinophils Relative: 3.8 % (ref 0.0–5.0)
HCT: 39.9 % (ref 36.0–46.0)
Hemoglobin: 13 g/dL (ref 12.0–15.0)
Lymphocytes Relative: 28.7 % (ref 12.0–46.0)
Lymphs Abs: 2.2 10*3/uL (ref 0.7–4.0)
MCHC: 32.5 g/dL (ref 30.0–36.0)
MCV: 95.5 fl (ref 78.0–100.0)
Monocytes Absolute: 0.7 10*3/uL (ref 0.1–1.0)
Monocytes Relative: 9 % (ref 3.0–12.0)
Neutro Abs: 4.3 10*3/uL (ref 1.4–7.7)
Neutrophils Relative %: 57.5 % (ref 43.0–77.0)
Platelets: 216 10*3/uL (ref 150.0–400.0)
RBC: 4.18 Mil/uL (ref 3.87–5.11)
RDW: 13.3 % (ref 11.5–15.5)
WBC: 7.5 10*3/uL (ref 4.0–10.5)

## 2022-06-24 LAB — MICROALBUMIN / CREATININE URINE RATIO
Creatinine,U: 28.8 mg/dL
Microalb Creat Ratio: 7.2 mg/g (ref 0.0–30.0)
Microalb, Ur: 2.1 mg/dL — ABNORMAL HIGH (ref 0.0–1.9)

## 2022-06-24 LAB — TSH: TSH: 1.82 u[IU]/mL (ref 0.35–5.50)

## 2022-07-02 ENCOUNTER — Ambulatory Visit: Payer: PPO | Admitting: Dermatology

## 2022-07-02 VITALS — BP 148/82 | HR 79

## 2022-07-02 DIAGNOSIS — L578 Other skin changes due to chronic exposure to nonionizing radiation: Secondary | ICD-10-CM | POA: Diagnosis not present

## 2022-07-02 DIAGNOSIS — L814 Other melanin hyperpigmentation: Secondary | ICD-10-CM | POA: Diagnosis not present

## 2022-07-02 DIAGNOSIS — L821 Other seborrheic keratosis: Secondary | ICD-10-CM | POA: Diagnosis not present

## 2022-07-02 DIAGNOSIS — L820 Inflamed seborrheic keratosis: Secondary | ICD-10-CM

## 2022-07-02 DIAGNOSIS — Z85828 Personal history of other malignant neoplasm of skin: Secondary | ICD-10-CM

## 2022-07-02 DIAGNOSIS — Z1283 Encounter for screening for malignant neoplasm of skin: Secondary | ICD-10-CM | POA: Diagnosis not present

## 2022-07-02 DIAGNOSIS — Z853 Personal history of malignant neoplasm of breast: Secondary | ICD-10-CM

## 2022-07-02 NOTE — Progress Notes (Signed)
   Follow-Up Visit   Subjective  Paige Medina is a 89 y.o. female who presents for the following: Skin Cancer Screening and Full Body Skin Exam, hx of BCC  The patient presents for Total-Body Skin Exam (TBSE) for skin cancer screening and mole check. The patient has spots, moles and lesions to be evaluated, some may be new or changing and the patient has concerns that these could be cancer.    The following portions of the chart were reviewed this encounter and updated as appropriate: medications, allergies, medical history  Review of Systems:  No other skin or systemic complaints except as noted in HPI or Assessment and Plan.  Objective  Well appearing patient in no apparent distress; mood and affect are within normal limits.  A full examination was performed including scalp, head, eyes, ears, nose, lips, neck, chest, axillae, abdomen, back, buttocks, bilateral upper extremities, bilateral lower extremities, hands, feet, fingers, toes, fingernails, and toenails. All findings within normal limits unless otherwise noted below.   Relevant physical exam findings are noted in the Assessment and Plan.  trunk x 3 (3) Stuck-on, waxy, tan-brown papules --Discussed benign etiology and prognosis.     Assessment & Plan   LENTIGINES, SEBORRHEIC KERATOSES, HEMANGIOMAS - Benign normal skin lesions - Benign-appearing - Call for any changes  MELANOCYTIC NEVI - Tan-brown and/or pink-flesh-colored symmetric macules and papules - Benign appearing on exam today - Observation - Call clinic for new or changing moles - Recommend daily use of broad spectrum spf 30+ sunscreen to sun-exposed areas.   ACTINIC DAMAGE - Chronic condition, secondary to cumulative UV/sun exposure - diffuse scaly erythematous macules with underlying dyspigmentation - Recommend daily broad spectrum sunscreen SPF 30+ to sun-exposed areas, reapply every 2 hours as needed.  - Staying in the shade or wearing long sleeves, sun  glasses (UVA+UVB protection) and wide brim hats (4-inch brim around the entire circumference of the hat) are also recommended for sun protection.  - Call for new or changing lesions.  HISTORY OF BASAL CELL CARCINOMA OF THE SKIN - No evidence of recurrence today - Recommend regular full body skin exams - Recommend daily broad spectrum sunscreen SPF 30+ to sun-exposed areas, reapply every 2 hours as needed.  - Call if any new or changing lesions are noted between office visits   HISTORY OF LEFT BREAST CANCER  No lymphadenopathy    SKIN CANCER SCREENING PERFORMED TODAY.  Inflamed seborrheic keratosis (3) trunk x 3  Symptomatic, irritating, patient would like treated.   Destruction of lesion - trunk x 3 Complexity: simple   Destruction method: cryotherapy   Informed consent: discussed and consent obtained   Timeout:  patient name, date of birth, surgical site, and procedure verified Lesion destroyed using liquid nitrogen: Yes   Region frozen until ice ball extended beyond lesion: Yes   Outcome: patient tolerated procedure well with no complications   Post-procedure details: wound care instructions given     Return in about 1 year (around 07/02/2023) for TBSE, hx of BCC.  IAngelique Holm, CMA, am acting as scribe for Armida Sans, MD .   Documentation: I have reviewed the above documentation for accuracy and completeness, and I agree with the above.  Armida Sans, MD

## 2022-07-02 NOTE — Patient Instructions (Addendum)
Cryotherapy Aftercare  Wash gently with soap and water everyday.   Apply Vaseline and Band-Aid daily until healed.     Due to recent changes in healthcare laws, you may see results of your pathology and/or laboratory studies on MyChart before the doctors have had a chance to review them. We understand that in some cases there may be results that are confusing or concerning to you. Please understand that not all results are received at the same time and often the doctors may need to interpret multiple results in order to provide you with the best plan of care or course of treatment. Therefore, we ask that you please give us 2 business days to thoroughly review all your results before contacting the office for clarification. Should we see a critical lab result, you will be contacted sooner.   If You Need Anything After Your Visit  If you have any questions or concerns for your doctor, please call our main line at [phone] and press option 4 to reach your doctor's medical assistant. If no one answers, please leave a voicemail as directed and we will return your call as soon as possible. Messages left after 4 pm will be answered the following business day.   You may also send us a message via MyChart. We typically respond to MyChart messages within 1-2 business days.  For prescription refills, please ask your pharmacy to contact our office. Our fax number is [phone].  If you have an urgent issue when the clinic is closed that cannot wait until the next business day, you can page your doctor at the number below.    Please note that while we do our best to be available for urgent issues outside of office hours, we are not available 24/7.   If you have an urgent issue and are unable to reach us, you may choose to seek medical care at your doctor's office, retail clinic, urgent care center, or emergency room.  If you have a medical emergency, please immediately call 911 or go to the  emergency department.  Pager Numbers  - Dr. Kowalski: [phone]  - Dr. Moye: [phone]  - Dr. Stewart: [phone]  In the event of inclement weather, please call our main line at [phone] for an update on the status of any delays or closures.  Dermatology Medication Tips: Please keep the boxes that topical medications come in in order to help keep track of the instructions about where and how to use these. Pharmacies typically print the medication instructions only on the boxes and not directly on the medication tubes.   If your medication is too expensive, please contact our office at [phone] option 4 or send us a message through MyChart.   We are unable to tell what your co-pay for medications will be in advance as this is different depending on your insurance coverage. However, we may be able to find a substitute medication at lower cost or fill out paperwork to get insurance to cover a needed medication.   If a prior authorization is required to get your medication covered by your insurance company, please allow us 1-2 business days to complete this process.  Drug prices often vary depending on where the prescription is filled and some pharmacies may offer cheaper prices.  The website www.goodrx.com contains coupons for medications through different pharmacies. The prices here do not account for what the cost may be with help from insurance (it may be cheaper with your insurance), but the website can   give you the price if you did not use any insurance.  - You can print the associated coupon and take it with your prescription to the pharmacy.  - You may also stop by our office during regular business hours and pick up a GoodRx coupon card.  - If you need your prescription sent electronically to a different pharmacy, notify our office through [facility] MyChart or by phone at [phone] option 4.     Si Usted Necesita Algo Despus de Su Visita  Tambin puede  enviarnos un mensaje a travs de MyChart. Por lo general respondemos a los mensajes de MyChart en el transcurso de 1 a 2 das hbiles.  Para renovar recetas, por favor pida a su farmacia que se ponga en contacto con nuestra oficina. Nuestro nmero de fax es el [phone].  Si tiene un asunto urgente cuando la clnica est cerrada y que no puede esperar hasta el siguiente da hbil, puede llamar/localizar a su doctor(a) al nmero que aparece a continuacin.   Por favor, tenga en cuenta que aunque hacemos todo lo posible para estar disponibles para asuntos urgentes fuera del horario de oficina, no estamos disponibles las 24 horas del da, los 7 das de la semana.   Si tiene un problema urgente y no puede comunicarse con nosotros, puede optar por buscar atencin mdica  en el consultorio de su doctor(a), en una clnica privada, en un centro de atencin urgente o en una sala de emergencias.  Si tiene una emergencia mdica, por favor llame inmediatamente al 911 o vaya a la sala de emergencias.  Nmeros de bper  - Dr. Kowalski: [phone]  - Dra. Moye: [phone]  - Dra. Stewart: [phone]  En caso de inclemencias del tiempo, por favor llame a nuestra lnea principal al [phone] para una actualizacin sobre el estado de cualquier retraso o cierre.  Consejos para la medicacin en dermatologa: Por favor, guarde las cajas en las que vienen los medicamentos de uso tpico para ayudarle a seguir las instrucciones sobre dnde y cmo usarlos. Las farmacias generalmente imprimen las instrucciones del medicamento slo en las cajas y no directamente en los tubos del medicamento.   Si su medicamento es muy caro, por favor, pngase en contacto con nuestra oficina llamando al [phone] y presione la opcin 4 o envenos un mensaje a travs de MyChart.   No podemos decirle cul ser su copago por los medicamentos por adelantado ya que esto es diferente dependiendo de la cobertura de su seguro.  Sin embargo, es posible que podamos encontrar un medicamento sustituto a menor costo o llenar un formulario para que el seguro cubra el medicamento que se considera necesario.   Si se requiere una autorizacin previa para que su compaa de seguros cubra su medicamento, por favor permtanos de 1 a 2 das hbiles para completar este proceso.  Los precios de los medicamentos varan con frecuencia dependiendo del lugar de dnde se surte la receta y alguna farmacias pueden ofrecer precios ms baratos.  El sitio web www.goodrx.com tiene cupones para medicamentos de diferentes farmacias. Los precios aqu no tienen en cuenta lo que podra costar con la ayuda del seguro (puede ser ms barato con su seguro), pero el sitio web puede darle el precio si no utiliz ningn seguro.  - Puede imprimir el cupn correspondiente y llevarlo con su receta a la farmacia.  - Tambin puede pasar por nuestra oficina durante el horario de atencin regular y recoger una tarjeta de cupones de GoodRx.  -   Si necesita que su receta se enve electrnicamente a una farmacia diferente, informe a nuestra oficina a travs de MyChart de [facility] o por telfono llamando al [phone] y presione la opcin 4.  

## 2022-07-06 ENCOUNTER — Telehealth: Payer: Self-pay | Admitting: Family Medicine

## 2022-07-06 NOTE — Telephone Encounter (Signed)
Patient called in and stated that the hydrALAZINE (APRESOLINE) 25 MG tablet is lowered her top number by 35 but still over 165 and her bottom number has gone down into the 60s. She wants to know the next steps or what she should do. Please advise. Thank you!

## 2022-07-07 MED ORDER — HYDRALAZINE HCL 50 MG PO TABS: 50.0000 mg | ORAL_TABLET | Freq: Two times a day (BID) | ORAL | 3 refills | Status: DC

## 2022-07-07 NOTE — Telephone Encounter (Signed)
Recommend we increase hydralazine to 50mg  BID.  Take 2 tablets at a time of 25mg  dose until she runs out, new dose will be sent to pharmacy

## 2022-07-07 NOTE — Addendum Note (Signed)
Addended by: Eustaquio Boyden on: 07/07/2022 01:58 PM   Modules accepted: Orders

## 2022-07-07 NOTE — Telephone Encounter (Signed)
Lvm asking pt to call back.  Need to relay Dr. G's message.  

## 2022-07-08 ENCOUNTER — Encounter: Payer: Self-pay | Admitting: Dermatology

## 2022-07-08 NOTE — Telephone Encounter (Signed)
Noted  

## 2022-07-08 NOTE — Telephone Encounter (Signed)
Patient called in returning a call she received. Relayed message below and patient stated she understood. She stated that she hopes this works as her bottom number keeps getting low.

## 2022-07-24 ENCOUNTER — Ambulatory Visit (INDEPENDENT_AMBULATORY_CARE_PROVIDER_SITE_OTHER): Payer: PPO | Admitting: Family Medicine

## 2022-07-24 ENCOUNTER — Encounter: Payer: Self-pay | Admitting: Family Medicine

## 2022-07-24 VITALS — BP 118/67 | HR 63 | Temp 97.4°F | Ht 65.0 in | Wt 136.0 lb

## 2022-07-24 DIAGNOSIS — M545 Low back pain, unspecified: Secondary | ICD-10-CM | POA: Diagnosis not present

## 2022-07-24 DIAGNOSIS — G8929 Other chronic pain: Secondary | ICD-10-CM | POA: Diagnosis not present

## 2022-07-24 DIAGNOSIS — I340 Nonrheumatic mitral (valve) insufficiency: Secondary | ICD-10-CM | POA: Insufficient documentation

## 2022-07-24 DIAGNOSIS — I10 Essential (primary) hypertension: Secondary | ICD-10-CM

## 2022-07-24 NOTE — Progress Notes (Signed)
Ph: [phone] Fax: [phone]   Patient ID: Paige Medina, female    DOB: Nov 12, 1933, 89 y.o.   MRN: [id number]  This visit was conducted in person.  BP 118/67 Comment: yesterday at home  Pulse 63   Temp (!) 97.4 F (36.3 C) (Temporal)   Ht 5\' 5"  (1.651 m)   Wt 136 lb (61.7 kg)   SpO2 99%   BMI 22.63 kg/m    CC: 1 mo HTN f/u visit  Subjective:   HPI: Paige Medina is a 89 y.o. female presenting on 07/24/2022 for Medical Management of Chronic Issues (Here for 1 mo HTN f/u.)   See prior note for details.   HTN - Compliant with current antihypertensive regimen of hydralazine 50mg  IBD (latest addition), as well as benicar 40 mg daily. Does check blood pressures at home and brings log: wide fluctuations 104-181/56-74, HR 54-68. No low blood pressure readings or symptoms of dizziness/syncope. Denies HA, vision changes, CP/tightness, SOB, leg swelling. Nadir BP 104/56 on Tuesday this week. Today in office BP elevated. BP yesterday morning 118/67, HR 64. Average SBP over last 10 days 128.   She has a list of medications previously tried and their intolerances but she forgot this at home.  She does not want to try amlodipine - previously dropped BP too low and caused constipation.      Relevant past medical, surgical, family and social history reviewed and updated as indicated. Interim medical history since our last visit reviewed. Allergies and medications reviewed and updated. Outpatient Medications Prior to Visit  Medication Sig Dispense Refill   b complex vitamins capsule Take 1 capsule by mouth daily.     beta carotene w/minerals (OCUVITE) tablet Take 1 tablet by mouth every other day.     Blood Glucose Monitoring Suppl (ONE TOUCH ULTRA 2) w/Device KIT Use as directed 1 each 0   Boswellia-Glucosamine-Vit D (OSTEO BI-FLEX-GLUCOS/5-LOXIN) TABS Take 1 tablet by mouth daily.     Calcium Carb-Cholecalciferol (CALTRATE 600+D3) 600-20 MG-MCG TABS Take 1 tablet by mouth  daily.     clobetasol (TEMOVATE) 0.05 % external solution APPLY A SMALL AMOUNT TO AFFECTED AREA ONCE A DAY UP TO 5 TIMES PER WEEK 50 mL 3   Coenzyme Q10 (CO Q-10) 200 MG CAPS Take 200 mg by mouth every morning.      docusate sodium (COLACE) 100 MG capsule Take 100 mg by mouth 2 (two) times daily.     doxycycline (VIBRAMYCIN) 100 MG capsule Take 100 mg by mouth daily.     GENTEAL SEVERE 0.3 % GEL ophthalmic ointment      glucose blood (ONETOUCH ULTRA) test strip Use as instructed to check blood sugar once daily. 100 each 2   hydrALAZINE (APRESOLINE) 50 MG tablet Take 1 tablet (50 mg total) by mouth in the morning and at bedtime. 60 tablet 3   lovastatin (MEVACOR) 40 MG tablet Take 1 tablet (40 mg total) by mouth daily. 90 tablet 3   metroNIDAZOLE (METROGEL) 0.75 % gel APPLY SMALL AMOUNT EXTERNALLY TO THE AFFECTED AREA 1 TO 2 TIMES DAILY AS DIRECTED 45 g 11   olmesartan (BENICAR) 40 MG tablet TAKE 1 TABLET BY MOUTH EVERY DAY 90 tablet 1   OneTouch Delica Lancets 33G MISC Use as instructed to check blood sugar once daily. 100 each 2   Probiotic Product (PROBIOTIC DAILY PO) Take 1 capsule by mouth at bedtime.      triamcinolone cream (KENALOG) 0.1 % Apply 1 application topically  2 (two) times daily as needed. 30 g 1   Cholecalciferol 125 MCG (5000 UT) TABS Take 1 tablet by mouth every other day.     No facility-administered medications prior to visit.     Per HPI unless specifically indicated in ROS section below Review of Systems  Objective:  BP 118/67 Comment: yesterday at home  Pulse 63   Temp (!) 97.4 F (36.3 C) (Temporal)   Ht 5\' 5"  (1.651 m)   Wt 136 lb (61.7 kg)   SpO2 99%   BMI 22.63 kg/m   Wt Readings from Last 3 Encounters:  07/24/22 136 lb (61.7 kg)  06/23/22 136 lb 4 oz (61.8 kg)  06/19/22 138 lb (62.6 kg)      Physical Exam Vitals and nursing note reviewed.  Constitutional:      Appearance: Normal appearance. She is not ill-appearing.  HENT:     Mouth/Throat:      Mouth: Mucous membranes are moist.     Pharynx: Oropharynx is clear. No oropharyngeal exudate or posterior oropharyngeal erythema.  Eyes:     Extraocular Movements: Extraocular movements intact.     Pupils: Pupils are equal, round, and reactive to light.  Cardiovascular:     Rate and Rhythm: Normal rate and regular rhythm.     Pulses: Normal pulses.     Heart sounds: Murmur (3/6 systolic USB and apex) heard.  Pulmonary:     Effort: Pulmonary effort is normal. No respiratory distress.     Breath sounds: Normal breath sounds. No wheezing, rhonchi or rales.  Musculoskeletal:     Right lower leg: No edema.     Left lower leg: No edema.  Skin:    General: Skin is warm and dry.  Neurological:     Mental Status: She is alert.  Psychiatric:        Mood and Affect: Mood normal.        Behavior: Behavior normal.       Results for orders placed or performed in visit on 06/23/22  Comprehensive metabolic panel  Result Value Ref Range   Sodium 143 135 - 145 mEq/L   Potassium 4.4 3.5 - 5.1 mEq/L   Chloride 104 96 - 112 mEq/L   CO2 30 19 - 32 mEq/L   Glucose, Bld 104 (H) 70 - 99 mg/dL   BUN 20 6 - 23 mg/dL   Creatinine, Ser 1.61 0.40 - 1.20 mg/dL   Total Bilirubin 0.5 0.2 - 1.2 mg/dL   Alkaline Phosphatase 72 39 - 117 U/L   AST 17 0 - 37 U/L   ALT 15 0 - 35 U/L   Total Protein 6.5 6.0 - 8.3 g/dL   Albumin 4.1 3.5 - 5.2 g/dL   GFR 09.60 (L) >45.40 mL/min   Calcium 9.6 8.4 - 10.5 mg/dL  TSH  Result Value Ref Range   TSH 1.82 0.35 - 5.50 uIU/mL  Microalbumin / creatinine urine ratio  Result Value Ref Range   Microalb, Ur 2.1 (H) 0.0 - 1.9 mg/dL   Creatinine,U 98.1 mg/dL   Microalb Creat Ratio 7.2 0.0 - 30.0 mg/g  CBC with Differential/Platelet  Result Value Ref Range   WBC 7.5 4.0 - 10.5 K/uL   RBC 4.18 3.87 - 5.11 Mil/uL   Hemoglobin 13.0 12.0 - 15.0 g/dL   HCT 19.1 47.8 - 29.5 %   MCV 95.5 78.0 - 100.0 fl   MCHC 32.5 30.0 - 36.0 g/dL   RDW 62.1 30.8 - 65.7 %  Platelets  216.0 150.0 - 400.0 K/uL   Neutrophils Relative % 57.5 43.0 - 77.0 %   Lymphocytes Relative 28.7 12.0 - 46.0 %   Monocytes Relative 9.0 3.0 - 12.0 %   Eosinophils Relative 3.8 0.0 - 5.0 %   Basophils Relative 1.0 0.0 - 3.0 %   Neutro Abs 4.3 1.4 - 7.7 K/uL   Lymphs Abs 2.2 0.7 - 4.0 K/uL   Monocytes Absolute 0.7 0.1 - 1.0 K/uL   Eosinophils Absolute 0.3 0.0 - 0.7 K/uL   Basophils Absolute 0.1 0.0 - 0.1 K/uL    Assessment & Plan:   Problem List Items Addressed This Visit     White coat syndrome with diagnosis of hypertension - Primary    Chronic, BP remains markedly elevated in office however home readings largely well controlled. 10d average SBP 128 based on log she brings. Anticipate also component of arteriosclerosis contributing. Will continue olmesartan and hydralazine as up to now. Pt overall feels well.       Chronic low back pain    Chronic, deteriorated, sees PM&R s/p recent spinal steroid injection. Pain contributes to BP elevation.       Mitral regurgitation    Again heard today. Reviewed echo from 11/2017 showing mild MR. Pt states this has been present since age 68s, overall asymptomatic.         No orders of the defined types were placed in this encounter.   No orders of the defined types were placed in this encounter.   Patient Instructions  I think component of white coat hypertension causing elevated reading in office as home numbers are averaging good control.  Continue current medicines of olmesartan 40mg  nightly and hydralazine 50mg  twice daily.  DASH diet provided. Limit salt/sodium, continue good water intake, limit caffeine.  Good to see you today.  Let us know if blood pressures are running too low or persistent low bp symptoms.   Follow up plan: Return if symptoms worsen or fail to improve.  Eustaquio Boyden, MD

## 2022-07-24 NOTE — Patient Instructions (Signed)
I think component of white coat hypertension causing elevated reading in office as home numbers are averaging good control.  Continue current medicines of olmesartan 40mg  nightly and hydralazine 50mg  twice daily.  DASH diet provided. Limit salt/sodium, continue good water intake, limit caffeine.  Good to see you today.  Let us know if blood pressures are running too low or persistent low bp symptoms.

## 2022-07-24 NOTE — Assessment & Plan Note (Signed)
Chronic, deteriorated, sees PM&R s/p recent spinal steroid injection. Pain contributes to BP elevation.

## 2022-07-24 NOTE — Assessment & Plan Note (Signed)
Chronic, BP remains markedly elevated in office however home readings largely well controlled. 10d average SBP 128 based on log she brings. Anticipate also component of arteriosclerosis contributing. Will continue olmesartan and hydralazine as up to now. Pt overall feels well.

## 2022-07-24 NOTE — Assessment & Plan Note (Addendum)
Again heard today. Reviewed echo from 11/2017 showing mild MR. Pt states this has been present since age 37s, overall asymptomatic.

## 2022-07-25 ENCOUNTER — Other Ambulatory Visit: Payer: Self-pay | Admitting: Student

## 2022-07-25 DIAGNOSIS — I10 Essential (primary) hypertension: Secondary | ICD-10-CM

## 2022-08-07 ENCOUNTER — Other Ambulatory Visit: Payer: Self-pay | Admitting: Dermatology

## 2022-08-08 ENCOUNTER — Other Ambulatory Visit: Payer: Self-pay | Admitting: Dermatology
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
## 2022-08-25 ENCOUNTER — Encounter: Payer: Self-pay | Admitting: Neurosurgery

## 2022-08-25 ENCOUNTER — Ambulatory Visit: Payer: PPO | Admitting: Neurosurgery

## 2022-08-25 VITALS — BP 132/76 | Ht 65.0 in | Wt 135.2 lb

## 2022-08-25 DIAGNOSIS — M4316 Spondylolisthesis, lumbar region: Secondary | ICD-10-CM | POA: Diagnosis not present

## 2022-08-25 DIAGNOSIS — M48062 Spinal stenosis, lumbar region with neurogenic claudication: Secondary | ICD-10-CM | POA: Diagnosis not present

## 2022-08-25 DIAGNOSIS — M5416 Radiculopathy, lumbar region: Secondary | ICD-10-CM

## 2022-09-01 LAB — HM DIABETES EYE EXAM

## 2022-09-02 ENCOUNTER — Ambulatory Visit
Admission: RE | Admit: 2022-09-02 | Discharge: 2022-09-02 | Disposition: A | Discharge status: Home / Self Care | Payer: PPO | Source: Ambulatory Visit | Attending: Neurosurgery | Admitting: Neurosurgery

## 2022-09-02 DIAGNOSIS — M5416 Radiculopathy, lumbar region: Secondary | ICD-10-CM | POA: Insufficient documentation

## 2022-09-02 DIAGNOSIS — M48062 Spinal stenosis, lumbar region with neurogenic claudication: Secondary | ICD-10-CM | POA: Insufficient documentation

## 2022-09-02 DIAGNOSIS — M48061 Spinal stenosis, lumbar region without neurogenic claudication: Secondary | ICD-10-CM | POA: Diagnosis not present

## 2022-09-02 DIAGNOSIS — M5127 Other intervertebral disc displacement, lumbosacral region: Secondary | ICD-10-CM | POA: Diagnosis not present

## 2022-09-02 DIAGNOSIS — M47816 Spondylosis without myelopathy or radiculopathy, lumbar region: Secondary | ICD-10-CM | POA: Diagnosis not present

## 2022-09-02 DIAGNOSIS — M4316 Spondylolisthesis, lumbar region: Secondary | ICD-10-CM | POA: Diagnosis not present

## 2022-09-02 DIAGNOSIS — M5125 Other intervertebral disc displacement, thoracolumbar region: Secondary | ICD-10-CM | POA: Diagnosis not present

## 2022-09-02 DIAGNOSIS — M5136 Other intervertebral disc degeneration, lumbar region: Secondary | ICD-10-CM | POA: Diagnosis not present

## 2022-09-02 DIAGNOSIS — I700 Atherosclerosis of aorta: Secondary | ICD-10-CM | POA: Diagnosis not present

## 2022-09-04 DIAGNOSIS — M48062 Spinal stenosis, lumbar region with neurogenic claudication: Secondary | ICD-10-CM | POA: Diagnosis not present

## 2022-09-04 DIAGNOSIS — M5416 Radiculopathy, lumbar region: Secondary | ICD-10-CM | POA: Diagnosis not present

## 2022-09-04 DIAGNOSIS — M545 Low back pain, unspecified: Secondary | ICD-10-CM | POA: Diagnosis not present

## 2022-09-10 ENCOUNTER — Other Ambulatory Visit: Payer: Self-pay

## 2022-09-10 MED ORDER — OLMESARTAN MEDOXOMIL 40 MG PO TABS: 40.0000 mg | ORAL_TABLET | Freq: Every day | ORAL | 0 refills | Status: DC

## 2022-09-22 ENCOUNTER — Other Ambulatory Visit: Payer: Self-pay

## 2022-09-22 MED ORDER — DOXYCYCLINE HYCLATE 100 MG PO TABS: 100.0000 mg | ORAL_TABLET | Freq: Every day | ORAL | 0 refills | Status: AC

## 2022-09-22 NOTE — Progress Notes (Signed)
Patient called asking for RF of Doxycycline 100mg . She only takes 1 daily for rosacea. RX sent in. aw

## 2022-09-29 ENCOUNTER — Ambulatory Visit (INDEPENDENT_AMBULATORY_CARE_PROVIDER_SITE_OTHER): Payer: PPO | Admitting: Neurosurgery

## 2022-09-29 DIAGNOSIS — M48062 Spinal stenosis, lumbar region with neurogenic claudication: Secondary | ICD-10-CM

## 2022-09-29 DIAGNOSIS — M5416 Radiculopathy, lumbar region: Secondary | ICD-10-CM

## 2022-09-29 NOTE — Progress Notes (Signed)
Neurosurgery Telephone (Audio-Only) Note  Requesting Provider     Eustaquio Boyden, MD [address] [place],  [place] [address] T: [phone] F: [phone]  Primary Care Provider Eustaquio Boyden, MD [address] [place] [address] T: [phone] F: [phone]  Telehealth visit was conducted with [name], a 89 y.o. female via telephone.   History of Present Illness: Ms. Piel is an 89 y.o presenting today via telephone visit to review her MRI and xrays results as well as her response to PT.  While she continues to have back and L>R posterior leg pain, her symptoms have improved and are more manageable with PT. She denies any new or worsening symptoms. She has completed about 4 weeks of PT at [facility]   08/24/2022 Ms. Evany Goleman is an 89 year old with a history of hypertension, mitral regurgitation, and chronic lumbosacral symptoms who is here today with a chief complaint of chronic low back and bilateral hip pain.  This is been going on for many years and has become less responsive to injections over the last couple of years.  She describes an achy pain in her low back that is intermittent in nature and pain in her bilateral buttocks worse on the left than the right that radiates down the posterior lateral aspects of her legs.  She intermittently has circumferential electric shooting pain in her calves which primarily happens with walking for prolonged periods of time.  Her pain is most exacerbated by standing up from a seated position and improved some with rest, bending forward, and ice.   Conservative measures:  Physical therapy: Has been active all of her life and works out regularly however this exacerbates her symptoms.  She has not had any formal physical therapy for her lumbar spine. Multimodal medical therapy including regular antiinflammatories:  Tylenol, cbd gummies, cbd cream Injections:  epidural steroid injections 07/03/2022:  Left L5-S1 and left S1 transforaminal ESI (70% relief, 0.25% Marcaine, dexamethasone 13 mg)  05/21/2022: Left L5-S1 and left S1 transforaminal ESI (2.5 weeks of 60% relief, 0.25% Marcaine) 04/09/2022: Left hip injection (good relief only lasting 1 to 2 days, 0.5% Marcaine) 11/14/2021: Left hip injection (good relief, 0.5% Marcaine) 10/23/2021: Bilateral L5-S1 transforaminal ESI (100% improvement, Dr. Mariah Milling) 06/12/2021: Left hip injection (90% relief, Dr. Mariah Milling) 02/07/2021: Left hip injection (95% improvement, Dr. Mariah Milling) 10/06/2018: Right L5 transforaminal ESI ([facility]) 03/24/2018: Right L5 transforaminal ESI ([facility]) 10/21/2017: Right L5-S1 transforaminal ESI ([facility]) 06/03/2017: Right L5 transforaminal ESI ([facility])   General Review of Systems:  A ROS was performed including pertinent positive and negatives as documented.  All other systems are negative.  @ALLERGYCOLLAPSE @   Prior to Admission medications   Medication Sig Start Date End Date Taking? Authorizing Provider  b complex vitamins capsule Take 1 capsule by mouth daily.    [provider]  beta carotene w/minerals (OCUVITE) tablet Take 1 tablet by mouth every other day.    [provider]  Blood Glucose Monitoring Suppl (ONE TOUCH ULTRA 2) w/Device KIT Use as directed 01/07/17   Dianne Dun, MD  Boswellia-Glucosamine-Vit D (OSTEO BI-FLEX-GLUCOS/5-LOXIN) TABS Take 1 tablet by mouth daily. 08/14/21   [provider]  Calcium Carb-Cholecalciferol (CALTRATE 600+D3) 600-20 MG-MCG TABS Take 1 tablet by mouth daily. 11/12/21   Eustaquio Boyden, MD  clobetasol (TEMOVATE) 0.05 % external solution APPLY SMALL AMOUNT TOPICALLY TO THE AFFECTED AREA ONCE DAILY UP TO 5 DAYS WEEKLY 08/10/22   Deirdre Evener, MD  Coenzyme Q10 (CO Q-10) 200 MG CAPS Take 200 mg by mouth every morning.     [provider]  docusate sodium (COLACE) 100 MG capsule Take 100 mg by mouth 2 (two) times daily.     [provider]  doxycycline (VIBRA-TABS) 100 MG tablet Take 1 tablet (100 mg total) by mouth daily. 09/22/22 10/22/22  Deirdre Evener, MD  doxycycline (VIBRAMYCIN) 100 MG capsule Take 100 mg by mouth daily. 10/11/21   [provider]  glucose blood (ONETOUCH ULTRA) test strip Use as instructed to check blood sugar once daily. 11/12/21   Eustaquio Boyden, MD  hydrALAZINE (APRESOLINE) 50 MG tablet Take 1 tablet (50 mg total) by mouth in the morning and at bedtime. 07/07/22   Eustaquio Boyden, MD  lovastatin (MEVACOR) 40 MG tablet Take 1 tablet (40 mg total) by mouth daily. 12/16/21   Eustaquio Boyden, MD  metroNIDAZOLE (METROGEL) 0.75 % gel APPLY SMALL AMOUNT TOPICALLY TO THE AFFECTED AREA 1 TO 2 TIMES DAILY AS DIRECTED 08/10/22   Deirdre Evener, MD  olmesartan (BENICAR) 40 MG tablet Take 1 tablet (40 mg total) by mouth daily. 09/10/22   Iran Ouch, MD  OneTouch Delica Lancets 33G MISC Use as instructed to check blood sugar once daily. 11/12/21   Eustaquio Boyden, MD  triamcinolone cream (KENALOG) 0.1 % APPLY TOPICALLY TO THE AFFECTED AREA TWICE DAILY AS NEEDED 08/10/22   Deirdre Evener, MD    DATA REVIEWED    Imaging Studies  MRI L spine 09/02/22  IMPRESSION: 1. Compared with previous MRI from 08/01/2020, there is mildly progressive mild to moderate multifactorial spinal stenosis at L3-4 with asymmetric narrowing of the right lateral recess. 2. Stable severe multifactorial spinal stenosis at L4-5 with at least moderate narrowing of both lateral recesses and foramina. 3. Slightly increased size of chronic left subarticular zone disc extrusion at L5-S1 with probable left S1 nerve root encroachment. Mild left foraminal narrowing has also mildly increased. 4. Stable chronic left paracentral disc extrusion at T12-L1.     Electronically Signed   By: Carey Bullocks M.D.   On: 09/11/2022 13:44  Lumbar flex/ex 09/02/22 IMPRESSION: 1. No fracture or acute  finding. 2. No subluxation with flexion or extension. 3. Grade 1 anterolisthesis of L4 on L5 is similar to the prior lumbar MRI from 08/01/2020. Slight anterolisthesis of L3 on L4 was not apparent on the prior MRI. 4. Disc degenerative changes from L3-L4 through L5-S1 have progressed since the prior MRI.     Electronically Signed   By: Amie Portland M.D.   On: 09/09/2022 11:19  IMPRESSION  Ms. Mehrotra is a 89 y.o. female who I performed a telephone encounter today for evaluation and management of:  Lumbar stenosis with neurogenic claudication  PLAN  Ms. Chana is a very pleasant 89 y.o presenting today via telephone visit. She has experienced some improvement with PT. We discussed her MRI and xrays which do show findings consistent with her symptoms. Given that she has improved some with PT, she would like to continue with conservative treatment. We discussed the possibility of a repeat injection vs discussion of surgical intervention however she would like to hold off on this at this time. We will see her going forward on an as needed basis should she have any new or worsening symptoms or decide she would like further treatment. She expressed understanding and was in agreement with this plan  No orders of the defined types were placed in this encounter.  DISPOSITION  Follow up: In person appointment in  PRN  Susanne Borders, PA   TELEPHONE DOCUMENTATION   This visit was performed via telephone.  Patient location: home Provider location: home office  I spent a total of 5 minutes non-face-to-face activities for this visit on the date of this encounter including review of current clinical condition and response to treatment.  The patient is aware of and accepts the limits of this telehealth visit.

## 2022-10-07 DIAGNOSIS — M5416 Radiculopathy, lumbar region: Secondary | ICD-10-CM | POA: Diagnosis not present

## 2022-10-07 DIAGNOSIS — M545 Low back pain, unspecified: Secondary | ICD-10-CM | POA: Diagnosis not present

## 2022-10-07 DIAGNOSIS — M48062 Spinal stenosis, lumbar region with neurogenic claudication: Secondary | ICD-10-CM | POA: Diagnosis not present

## 2022-10-12 ENCOUNTER — Telehealth: Payer: Self-pay | Admitting: Neurosurgery

## 2022-10-12 NOTE — Telephone Encounter (Signed)
Telephone call created in error °

## 2022-10-15 ENCOUNTER — Encounter (INDEPENDENT_AMBULATORY_CARE_PROVIDER_SITE_OTHER): Payer: Self-pay

## 2022-10-22 DIAGNOSIS — Z170 Estrogen receptor positive status [ER+]: Secondary | ICD-10-CM | POA: Diagnosis not present

## 2022-10-22 DIAGNOSIS — C50212 Malignant neoplasm of upper-inner quadrant of left female breast: Secondary | ICD-10-CM | POA: Diagnosis not present

## 2022-10-28 ENCOUNTER — Other Ambulatory Visit: Payer: Self-pay | Admitting: General Surgery

## 2022-10-28 DIAGNOSIS — Z1231 Encounter for screening mammogram for malignant neoplasm of breast: Secondary | ICD-10-CM

## 2022-10-30 ENCOUNTER — Other Ambulatory Visit: Payer: Self-pay | Admitting: Family Medicine

## 2022-10-30 DIAGNOSIS — I10 Essential (primary) hypertension: Secondary | ICD-10-CM

## 2022-11-03 ENCOUNTER — Ambulatory Visit (INDEPENDENT_AMBULATORY_CARE_PROVIDER_SITE_OTHER): Payer: PPO | Admitting: Neurosurgery

## 2022-11-03 DIAGNOSIS — M48062 Spinal stenosis, lumbar region with neurogenic claudication: Secondary | ICD-10-CM | POA: Diagnosis not present

## 2022-11-03 DIAGNOSIS — M5416 Radiculopathy, lumbar region: Secondary | ICD-10-CM

## 2022-11-03 NOTE — Progress Notes (Signed)
Neurosurgery Telephone (Audio-Only) Note  Requesting Provider     Eustaquio Boyden, MD [address] [place],  [place] [address] T: [phone] F: [phone]  Primary Care Provider Eustaquio Boyden, MD [address] [place] [address] T: [phone] F: [phone]  Telehealth visit was conducted with Doretha Paige Medina, a 89 y.o. female via telephone.   History of Present Illness: 11/03/22 [name] is an 89 y.o presenting today via telephone visit at her request to discuss further treatment options as well as surgical options.  Ultimately she has failed to have prolonged improvement with physical therapy and states her symptoms have recurred and are continue to be intermittent and affecting her quality of life.  She continues to have back and left greater then right posterior leg pain that is interfering with her quality of life.  She is not interested in further injections as these have become ineffective for her.  09/29/22 [name] is an 89 y.o presenting today via telephone visit to review her MRI and xrays results as well as her response to PT.  While she continues to have back and L>R posterior leg pain, her symptoms have improved and are more manageable with PT. She denies any new or worsening symptoms. She has completed about 4 weeks of PT at [facility].   08/24/2022 [name] is an 89 year old with a history of hypertension, mitral regurgitation, and chronic lumbosacral symptoms who is here today with a chief complaint of chronic low back and bilateral hip pain.  This is been going on for many years and has become less responsive to injections over the last couple of years.  She describes an achy pain in her low back that is intermittent in nature and pain in her bilateral buttocks worse on the left than the right that radiates down the posterior lateral aspects of her legs.  She intermittently has circumferential electric shooting pain in her  calves which primarily happens with walking for prolonged periods of time.  Her pain is most exacerbated by standing up from a seated position and improved some with rest, bending forward, and ice.   Conservative measures:  Physical therapy: Has been active all of her life and works out regularly however this exacerbates her symptoms.  She has not had any formal physical therapy for her lumbar spine. Multimodal medical therapy including regular antiinflammatories:  Tylenol, cbd gummies, cbd cream Injections:  epidural steroid injections 07/03/2022: Left L5-S1 and left S1 transforaminal ESI (70% relief, 0.25% Marcaine, dexamethasone 13 mg)  05/21/2022: Left L5-S1 and left S1 transforaminal ESI (2.5 weeks of 60% relief, 0.25% Marcaine) 04/09/2022: Left hip injection (good relief only lasting 1 to 2 days, 0.5% Marcaine) 11/14/2021: Left hip injection (good relief, 0.5% Marcaine) 10/23/2021: Bilateral L5-S1 transforaminal ESI (100% improvement, Dr. Mariah Milling) 06/12/2021: Left hip injection (90% relief, Dr. Mariah Milling) 02/07/2021: Left hip injection (95% improvement, Dr. Mariah Milling) 10/06/2018: Right L5 transforaminal ESI ([facility]) 03/24/2018: Right L5 transforaminal ESI ([facility]) 10/21/2017: Right L5-S1 transforaminal ESI ([facility]) 06/03/2017: Right L5 transforaminal ESI ([facility])   General Review of Systems:  A ROS was performed including pertinent positive and negatives as documented.  All other systems are negative.    Prior to Admission medications   Medication Sig Start Date End Date Taking? Authorizing Provider  b complex vitamins capsule Take 1 capsule by mouth daily.    [provider]  beta carotene w/minerals (OCUVITE) tablet Take 1 tablet by mouth every other day.  [provider]  Blood Glucose Monitoring Suppl (ONE TOUCH ULTRA 2) w/Device KIT Use as directed 01/07/17   Dianne Dun, MD  Boswellia-Glucosamine-Vit D (OSTEO BI-FLEX-GLUCOS/5-LOXIN) TABS  Take 1 tablet by mouth daily. 08/14/21   [provider]  Calcium Carb-Cholecalciferol (CALTRATE 600+D3) 600-20 MG-MCG TABS Take 1 tablet by mouth daily. 11/12/21   Eustaquio Boyden, MD  clobetasol (TEMOVATE) 0.05 % external solution APPLY SMALL AMOUNT TOPICALLY TO THE AFFECTED AREA ONCE DAILY UP TO 5 DAYS WEEKLY 08/10/22   Deirdre Evener, MD  Coenzyme Q10 (CO Q-10) 200 MG CAPS Take 200 mg by mouth every morning.     [provider]  docusate sodium (COLACE) 100 MG capsule Take 100 mg by mouth 2 (two) times daily.    [provider]  doxycycline (VIBRA-TABS) 100 MG tablet Take 1 tablet (100 mg total) by mouth daily. 09/22/22 10/22/22  Deirdre Evener, MD  doxycycline (VIBRAMYCIN) 100 MG capsule Take 100 mg by mouth daily. 10/11/21   [provider]  glucose blood (ONETOUCH ULTRA) test strip Use as instructed to check blood sugar once daily. 11/12/21   Eustaquio Boyden, MD  hydrALAZINE (APRESOLINE) 50 MG tablet Take 1 tablet (50 mg total) by mouth in the morning and at bedtime. 07/07/22   Eustaquio Boyden, MD  lovastatin (MEVACOR) 40 MG tablet Take 1 tablet (40 mg total) by mouth daily. 12/16/21   Eustaquio Boyden, MD  metroNIDAZOLE (METROGEL) 0.75 % gel APPLY SMALL AMOUNT TOPICALLY TO THE AFFECTED AREA 1 TO 2 TIMES DAILY AS DIRECTED 08/10/22   Deirdre Evener, MD  olmesartan (BENICAR) 40 MG tablet Take 1 tablet (40 mg total) by mouth daily. 09/10/22   Iran Ouch, MD  OneTouch Delica Lancets 33G MISC Use as instructed to check blood sugar once daily. 11/12/21   Eustaquio Boyden, MD  triamcinolone cream (KENALOG) 0.1 % APPLY TOPICALLY TO THE AFFECTED AREA TWICE DAILY AS NEEDED 08/10/22   Deirdre Evener, MD    DATA REVIEWED    Imaging Studies  MRI L spine 09/02/22  IMPRESSION: 1. Compared with previous MRI from 08/01/2020, there is mildly progressive mild to moderate multifactorial spinal stenosis at L3-4 with asymmetric narrowing of the right lateral  recess. 2. Stable severe multifactorial spinal stenosis at L4-5 with at least moderate narrowing of both lateral recesses and foramina. 3. Slightly increased size of chronic left subarticular zone disc extrusion at L5-S1 with probable left S1 nerve root encroachment. Mild left foraminal narrowing has also mildly increased. 4. Stable chronic left paracentral disc extrusion at T12-L1.     Electronically Signed   By: Carey Bullocks M.D.   On: 09/11/2022 13:44  Lumbar flex/ex 09/02/22 IMPRESSION: 1. No fracture or acute finding. 2. No subluxation with flexion or extension. 3. Grade 1 anterolisthesis of L4 on L5 is similar to the prior lumbar MRI from 08/01/2020. Slight anterolisthesis of L3 on L4 was not apparent on the prior MRI. 4. Disc degenerative changes from L3-L4 through L5-S1 have progressed since the prior MRI.     Electronically Signed   By: Amie Portland M.D.   On: 09/09/2022 11:19  IMPRESSION  [name] is a 89 y.o. female who I performed a telephone encounter today for evaluation and management of:  Lumbar stenosis with neurogenic claudication  PLAN  [name] is a very pleasant 89 y.o presenting today via telephone visit.  Unfortunately she has failed conservative management.  I do feel there is a role for surgical intervention.  She asked about the risks and benefits as well as details regarding surgical procedure.  I did discuss that this is a more appropriate question for one of our surgeons.  She has requested to see Dr. Myer Haff discuss surgical options.  She has completed physical therapy at [facility].  I have sent a message to Patty to obtain her PT notes and formal discharge letter once we have this information, she can be scheduled next available new patient visit with Dr. Myer Haff  No orders of the defined types were placed in this encounter.   DISPOSITION  Follow up: In person appointment in  PRN  Susanne Borders, PA   TELEPHONE DOCUMENTATION    This visit was performed via telephone.  Patient location: home Provider location: home office  I spent a total of 5 minutes non-face-to-face activities for this visit on the date of this encounter including review of current clinical condition and response to treatment.  The patient is aware of and accepts the limits of this telehealth visit.
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
## 2022-11-10 ENCOUNTER — Encounter: Payer: Self-pay | Admitting: Neurosurgery

## 2022-11-10 ENCOUNTER — Ambulatory Visit: Payer: PPO | Admitting: Neurosurgery

## 2022-11-10 VITALS — BP 140/72 | Ht 65.0 in | Wt 136.0 lb

## 2022-11-10 DIAGNOSIS — M4316 Spondylolisthesis, lumbar region: Secondary | ICD-10-CM | POA: Diagnosis not present

## 2022-11-10 DIAGNOSIS — M5116 Intervertebral disc disorders with radiculopathy, lumbar region: Secondary | ICD-10-CM | POA: Diagnosis not present

## 2022-11-10 DIAGNOSIS — M5416 Radiculopathy, lumbar region: Secondary | ICD-10-CM

## 2022-11-10 DIAGNOSIS — M48062 Spinal stenosis, lumbar region with neurogenic claudication: Secondary | ICD-10-CM | POA: Diagnosis not present

## 2022-11-10 NOTE — Patient Instructions (Signed)
Please see below for information in regards to your upcoming surgery:   Planned surgery: L3-5 posterior spinal decompression, left L5-S1 microdiscectomy   Surgery date: 12/14/22 at [facility] (Medical Mall: [address], [place], [place] [address]) - you will find out your arrival time the business day before your surgery.   Pre-op appointment at [facility] Pre-admit Testing: we will call you with a date/time for this. If you are scheduled for an in person appointment, Pre-admit Testing is located on the first floor of the [facility] building, [address], [address]. Please bring all prescriptions in the original prescription bottles to your appointment. During this appointment, they will advise you which medications you can take the morning of surgery, and which medications you will need to hold for surgery. Labs (such as blood work, EKG) may be done at your pre-op appointment. You are not required to fast for these labs. Should you need to change your pre-op appointment, please call Pre-admit testing at [phone].     Surgical clearance: we will send a clearance form to Dr Sharen Hones    Common restrictions after surgery: No bending, lifting, or twisting ("BLT"). Avoid lifting objects heavier than 10 pounds for the first 6 weeks after surgery. Where possible, avoid household activities that involve lifting, bending, reaching, pushing, or pulling such as laundry, vacuuming, grocery shopping, and childcare. Try to arrange for help from friends and family for these activities while you heal. Do not drive while taking prescription pain medication. Weeks 6 through 12 after surgery: avoid lifting more than 25 pounds.     How to contact us:  If you have any questions/concerns before or after surgery, you can reach [place] at [phone], or you can send a mychart message. We can be reached by phone or mychart 8am-4pm, Monday-Friday.  *Please note: Calls  after 4pm are forwarded to a third party answering service. Mychart messages are not routinely monitored during evenings, weekends, and holidays. Please call our office to contact the answering service for urgent concerns during non-business hours.   Appointments/FMLA & disability paperwork: Patty Nurse: Royston Cowper  Medical assistants: Laurann Montana, & Lyla Son Physician Assistants: Manning Charity & Drake Leach Surgeons: Venetia Night, MD & Ernestine Mcmurray, MD

## 2022-11-11 ENCOUNTER — Other Ambulatory Visit: Payer: Self-pay

## 2022-11-11 DIAGNOSIS — Z01818 Encounter for other preprocedural examination: Secondary | ICD-10-CM

## 2022-11-12 ENCOUNTER — Telehealth: Payer: Self-pay

## 2022-11-12 ENCOUNTER — Other Ambulatory Visit: Payer: Self-pay | Admitting: Family Medicine

## 2022-11-12 DIAGNOSIS — E1122 Type 2 diabetes mellitus with diabetic chronic kidney disease: Secondary | ICD-10-CM

## 2022-11-12 DIAGNOSIS — E1169 Type 2 diabetes mellitus with other specified complication: Secondary | ICD-10-CM

## 2022-11-12 DIAGNOSIS — E119 Type 2 diabetes mellitus without complications: Secondary | ICD-10-CM

## 2022-11-12 NOTE — Telephone Encounter (Signed)
Lvm asking pt to call back. Needs to schedule separate surgical clearance OV from upcoming CPE.   [Form is in basket on [employer].]

## 2022-11-12 NOTE — Telephone Encounter (Signed)
Received faxed surgical clearance form from [place], RN, of [facility]-. Pt needs L3-L5 posterior spinal decompression; L L5-S1 microdiscectomy w/ general anesthesia. Surgery date- 12/14/22.

## 2022-11-13 NOTE — Telephone Encounter (Signed)
Surgical Clearance appointment has been scheduled 11/18/2022 at 10:30 am with Dr. Reece Agar.

## 2022-11-17 ENCOUNTER — Ambulatory Visit (INDEPENDENT_AMBULATORY_CARE_PROVIDER_SITE_OTHER): Payer: PPO

## 2022-11-17 ENCOUNTER — Other Ambulatory Visit (INDEPENDENT_AMBULATORY_CARE_PROVIDER_SITE_OTHER): Payer: PPO

## 2022-11-17 VITALS — Ht 65.0 in | Wt 136.0 lb

## 2022-11-17 DIAGNOSIS — N183 Chronic kidney disease, stage 3 unspecified: Secondary | ICD-10-CM | POA: Diagnosis not present

## 2022-11-17 DIAGNOSIS — Z0000 Encounter for general adult medical examination without abnormal findings: Secondary | ICD-10-CM | POA: Diagnosis not present

## 2022-11-17 DIAGNOSIS — E1122 Type 2 diabetes mellitus with diabetic chronic kidney disease: Secondary | ICD-10-CM | POA: Diagnosis not present

## 2022-11-17 DIAGNOSIS — E1169 Type 2 diabetes mellitus with other specified complication: Secondary | ICD-10-CM | POA: Diagnosis not present

## 2022-11-17 DIAGNOSIS — E785 Hyperlipidemia, unspecified: Secondary | ICD-10-CM | POA: Diagnosis not present

## 2022-11-17 DIAGNOSIS — E119 Type 2 diabetes mellitus without complications: Secondary | ICD-10-CM

## 2022-11-17 LAB — CBC WITH DIFFERENTIAL/PLATELET
Basophils Absolute: 0.2 10*3/uL — ABNORMAL HIGH (ref 0.0–0.1)
Basophils Relative: 2.6 % (ref 0.0–3.0)
Eosinophils Absolute: 0.3 10*3/uL (ref 0.0–0.7)
Eosinophils Relative: 4.1 % (ref 0.0–5.0)
HCT: 41.5 % (ref 36.0–46.0)
Hemoglobin: 13.4 g/dL (ref 12.0–15.0)
Lymphocytes Relative: 27.9 % (ref 12.0–46.0)
Lymphs Abs: 1.9 10*3/uL (ref 0.7–4.0)
MCHC: 32.3 g/dL (ref 30.0–36.0)
MCV: 96.5 fl (ref 78.0–100.0)
Monocytes Absolute: 0.6 10*3/uL (ref 0.1–1.0)
Monocytes Relative: 9.2 % (ref 3.0–12.0)
Neutro Abs: 3.8 10*3/uL (ref 1.4–7.7)
Neutrophils Relative %: 56.2 % (ref 43.0–77.0)
Platelets: 260 10*3/uL (ref 150.0–400.0)
RBC: 4.3 Mil/uL (ref 3.87–5.11)
RDW: 13 % (ref 11.5–15.5)
WBC: 6.7 10*3/uL (ref 4.0–10.5)

## 2022-11-17 LAB — MICROALBUMIN / CREATININE URINE RATIO
Creatinine,U: 40.7 mg/dL
Microalb Creat Ratio: 5.9 mg/g (ref 0.0–30.0)
Microalb, Ur: 2.4 mg/dL — ABNORMAL HIGH (ref 0.0–1.9)

## 2022-11-17 LAB — LIPID PANEL
Cholesterol: 178 mg/dL (ref 0–200)
HDL: 64.8 mg/dL (ref >39.00)
LDL Cholesterol: 86 mg/dL (ref 0–99)
NonHDL: 113.69
Total CHOL/HDL Ratio: 3
Triglycerides: 140 mg/dL (ref 0.0–149.0)
VLDL: 28 mg/dL (ref 0.0–40.0)

## 2022-11-17 LAB — COMPREHENSIVE METABOLIC PANEL
ALT: 17 U/L (ref 0–35)
AST: 18 U/L (ref 0–37)
Albumin: 4.2 g/dL (ref 3.5–5.2)
Alkaline Phosphatase: 77 U/L (ref 39–117)
BUN: 19 mg/dL (ref 6–23)
CO2: 29 meq/L (ref 19–32)
Calcium: 10 mg/dL (ref 8.4–10.5)
Chloride: 104 meq/L (ref 96–112)
Creatinine, Ser: 1.04 mg/dL (ref 0.40–1.20)
GFR: 48.31 mL/min — ABNORMAL LOW (ref >60.00)
Glucose, Bld: 104 mg/dL — ABNORMAL HIGH (ref 70–99)
Potassium: 4.4 meq/L (ref 3.5–5.1)
Sodium: 142 meq/L (ref 135–145)
Total Bilirubin: 0.6 mg/dL (ref 0.2–1.2)
Total Protein: 6.9 g/dL (ref 6.0–8.3)

## 2022-11-17 LAB — PHOSPHORUS: Phosphorus: 3.7 mg/dL (ref 2.3–4.6)

## 2022-11-17 LAB — HEMOGLOBIN A1C: Hgb A1c MFr Bld: 6.1 % (ref 4.6–6.5)

## 2022-11-17 LAB — VITAMIN D 25 HYDROXY (VIT D DEFICIENCY, FRACTURES): VITD: 28.32 ng/mL — ABNORMAL LOW (ref 30.00–100.00)

## 2022-11-17 NOTE — Patient Instructions (Signed)
[name] , Thank you for taking time to come for your Medicare Wellness Visit. I appreciate your ongoing commitment to your health goals. Please review the following plan we discussed and let me know if I can assist you in the future.   Referrals/Orders/Follow-Ups/Clinician Recommendations: Aim for 30 minutes of exercise or brisk walking, 6-8 glasses of water, and 5 servings of fruits and vegetables each day.  This is a list of the screening recommended for you and due dates:  Health Maintenance  Topic Date Due   Zoster (Shingles) Vaccine (1 of 2) 05/02/1952   Complete foot exam   04/30/2021   Hemoglobin A1C  04/30/2022   Flu Shot  10/01/2022   COVID-19 Vaccine (5 - 2023-24 season) 11/01/2022   DTaP/Tdap/Td vaccine (2 - Tdap) 07/28/2025*   Mammogram  11/28/2022   Eye exam for diabetics  09/01/2023   Medicare Annual Wellness Visit  11/17/2023   Pneumonia Vaccine  Completed   DEXA scan (bone density measurement)  Completed   HPV Vaccine  Aged Out  *Topic was postponed. The date shown is not the original due date.    Advanced directives: (In Chart) A copy of your advanced directives are scanned into your chart should your provider ever need it.  Next Medicare Annual Wellness Visit scheduled for next year: Yes
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
## 2022-11-18 ENCOUNTER — Ambulatory Visit (INDEPENDENT_AMBULATORY_CARE_PROVIDER_SITE_OTHER): Payer: PPO | Admitting: Family Medicine

## 2022-11-18 ENCOUNTER — Encounter: Payer: Self-pay | Admitting: Family Medicine

## 2022-11-18 ENCOUNTER — Ambulatory Visit (INDEPENDENT_AMBULATORY_CARE_PROVIDER_SITE_OTHER)
Admission: RE | Admit: 2022-11-18 | Discharge: 2022-11-18 | Disposition: A | Discharge status: Home / Self Care | Payer: PPO | Source: Ambulatory Visit | Attending: Family Medicine | Admitting: Family Medicine

## 2022-11-18 VITALS — BP 132/82 | HR 78 | Temp 97.5°F | Wt 131.8 lb

## 2022-11-18 DIAGNOSIS — I340 Nonrheumatic mitral (valve) insufficiency: Secondary | ICD-10-CM

## 2022-11-18 DIAGNOSIS — R0989 Other specified symptoms and signs involving the circulatory and respiratory systems: Secondary | ICD-10-CM

## 2022-11-18 DIAGNOSIS — G8929 Other chronic pain: Secondary | ICD-10-CM

## 2022-11-18 DIAGNOSIS — N183 Chronic kidney disease, stage 3 unspecified: Secondary | ICD-10-CM

## 2022-11-18 DIAGNOSIS — M545 Low back pain, unspecified: Secondary | ICD-10-CM

## 2022-11-18 DIAGNOSIS — R7303 Prediabetes: Secondary | ICD-10-CM

## 2022-11-18 DIAGNOSIS — I10 Essential (primary) hypertension: Secondary | ICD-10-CM | POA: Diagnosis not present

## 2022-11-18 DIAGNOSIS — M159 Polyosteoarthritis, unspecified: Secondary | ICD-10-CM

## 2022-11-18 DIAGNOSIS — N1831 Chronic kidney disease, stage 3a: Secondary | ICD-10-CM | POA: Diagnosis not present

## 2022-11-18 DIAGNOSIS — Z01818 Encounter for other preprocedural examination: Secondary | ICD-10-CM | POA: Diagnosis not present

## 2022-11-18 DIAGNOSIS — R9431 Abnormal electrocardiogram [ECG] [EKG]: Secondary | ICD-10-CM

## 2022-11-18 NOTE — Assessment & Plan Note (Signed)
Chronic, BP stable in office today. Continue current regimen.

## 2022-11-18 NOTE — Patient Instructions (Addendum)
Recent labs reassuring. EKG today  Chest xray today.  We will forward results to Dr Osborne Oman office.  I will order carotid ultrasounds through Dr Jari Sportsman office for further evaluation of possible carotid artery stenosis.  Good to see you today.

## 2022-11-18 NOTE — Assessment & Plan Note (Addendum)
RCRI = 0 Recent labs reassuring. Update CXR, EKG today.  Anticipate normal results, anticipate low risk to proceed with planned surgical intervention under general anesthesia.  Check carotid US for L>R carotid bruit heard today - this need not delay surgical intervention.

## 2022-11-18 NOTE — Assessment & Plan Note (Addendum)
Chronic, stable with latest GFR 48. Continue to monitor.

## 2022-11-18 NOTE — Progress Notes (Unsigned)
Ph: [phone] Fax: [phone]   Patient ID: Paige Medina, female    DOB: [birth year], 89 y.o.   MRN: [id number]  This visit was conducted in person.  BP 132/82   Pulse 78   Temp (!) 97.5 F (36.4 C)   Wt 131 lb 12.8 oz (59.8 kg)   SpO2 98%   BMI 21.93 kg/m    CC: preop evaluation Subjective:   HPI: Paige Medina is a 89 y.o. female presenting on 11/18/2022 for Surgical Clearance (Discuss clearance for back surgery. 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 )   Paige Medina  has a past medical history of Arthritis (2004), Basal cell carcinoma (09/09/2008), Basal cell carcinoma (09/05/2014), Basal cell carcinoma (03/04/2017), Breast cancer (HCC) (2012), Breast cancer (HCC) (2019), Chronic kidney disease, Diabetes mellitus without complication (HCC) (2009), GERD (gastroesophageal reflux disease), Heart murmur (1985), Hyperlipidemia (2012), Hypertension (1992), Malignant neoplasm of upper-inner quadrant of female breast (HCC) (2012), Personal history of radiation therapy (2012), Premature atrial contraction, PSVT (paroxysmal supraventricular tachycardia), PVC's (premature ventricular contractions), and Rosacea.  Planned upcoming L3-4 posterior spinal decompression and L L5/S1 microdiscectomy under general anesthesia.    Patient has tolerated general anesthesia well in the past.  Latest surgical intervention was R MCP arthrodesis 2022 by Dr Jacqulynn Cadet, prior to that had bilateral mastectomy with SLN biopsy 2019 by Dr Carolynne Edouard.  Denies trouble with post-op nausea/vomiting, or trouble awakening after surgery.   Denies chest pain, dyspnea, palpitations, leg swelling, HA, dizziness.  No fevers/chills, coughing, abd pain, diarrhea, or UTI symptoms.   She has seen Dr Kirke Corin latest 08/2021, for h/o frequent ectopy (PVCs and PACs) and hypertension, off beta blocker due to bradycardia.     Relevant past medical, surgical, family and social history reviewed and updated as  indicated. Interim medical history since our last visit reviewed. Allergies and medications reviewed and updated. Outpatient Medications Prior to Visit  Medication Sig Dispense Refill   b complex vitamins capsule Take 1 capsule by mouth daily.     beta carotene w/minerals (OCUVITE) tablet Take 1 tablet by mouth every other day.     Blood Glucose Monitoring Suppl (ONE TOUCH ULTRA 2) w/Device KIT Use as directed 1 each 0   Calcium Carb-Cholecalciferol (CALTRATE 600+D3) 600-20 MG-MCG TABS Take 1 tablet by mouth daily.     clobetasol (TEMOVATE) 0.05 % external solution APPLY SMALL AMOUNT TOPICALLY TO THE AFFECTED AREA ONCE DAILY UP TO 5 DAYS WEEKLY 50 mL 3   Coenzyme Q10 (CO Q-10) 200 MG CAPS Take 200 mg by mouth every morning.      docusate sodium (COLACE) 100 MG capsule Take 100 mg by mouth 2 (two) times daily.     glucose blood (ONETOUCH ULTRA) test strip Use as instructed to check blood sugar once daily. 100 each 2   hydrALAZINE (APRESOLINE) 50 MG tablet TAKE 1 TABLET(50 MG) BY MOUTH IN THE MORNING AND AT BEDTIME 60 tablet 0   lovastatin (MEVACOR) 40 MG tablet Take 1 tablet (40 mg total) by mouth daily. 90 tablet 3   metroNIDAZOLE (METROGEL) 0.75 % gel APPLY SMALL AMOUNT TOPICALLY TO THE AFFECTED AREA 1 TO 2 TIMES DAILY AS DIRECTED 45 g 11   olmesartan (BENICAR) 40 MG tablet Take 1 tablet (40 mg total) by mouth daily. 90 tablet 0   OneTouch Delica Lancets 33G MISC Use as instructed to check blood sugar once daily. 100 each 2   triamcinolone cream (KENALOG) 0.1 % APPLY TOPICALLY  TO THE AFFECTED AREA TWICE DAILY AS NEEDED 30 g 1   No facility-administered medications prior to visit.     Per HPI unless specifically indicated in ROS section below Review of Systems  Objective:  BP 132/82   Pulse 78   Temp (!) 97.5 F (36.4 C)   Wt 131 lb 12.8 oz (59.8 kg)   SpO2 98%   BMI 21.93 kg/m   Wt Readings from Last 3 Encounters:  11/18/22 131 lb 12.8 oz (59.8 kg)  11/17/22 136 lb (61.7 kg)   11/10/22 136 lb (61.7 kg)      Physical Exam Vitals and nursing note reviewed.  Constitutional:      Appearance: Normal appearance. She is not ill-appearing.  HENT:     Head: Normocephalic and atraumatic.     Mouth/Throat:     Mouth: Mucous membranes are moist.     Pharynx: Oropharynx is clear. No oropharyngeal exudate or posterior oropharyngeal erythema.  Eyes:     Extraocular Movements: Extraocular movements intact.     Pupils: Pupils are equal, round, and reactive to light.  Neck:     Vascular: Carotid bruit (L>R) present.  Cardiovascular:     Rate and Rhythm: Normal rate and regular rhythm.     Pulses: Normal pulses.     Heart sounds: Murmur (3/6 systolic USB) heard.  Pulmonary:     Effort: Pulmonary effort is normal. No respiratory distress.     Breath sounds: Normal breath sounds. No wheezing, rhonchi or rales.  Musculoskeletal:     Right lower leg: No edema.     Left lower leg: No edema.  Skin:    General: Skin is warm and dry.     Findings: No rash.  Neurological:     Mental Status: She is alert.  Psychiatric:        Mood and Affect: Mood normal.        Behavior: Behavior normal.       Results for orders placed or performed in visit on 11/17/22  CBC with Differential/Platelet  Result Value Ref Range   WBC 6.7 4.0 - 10.5 K/uL   RBC 4.30 3.87 - 5.11 Mil/uL   Hemoglobin 13.4 12.0 - 15.0 g/dL   HCT 16.1 09.6 - 04.5 %   MCV 96.5 78.0 - 100.0 fl   MCHC 32.3 30.0 - 36.0 g/dL   RDW 40.9 81.1 - 91.4 %   Platelets 260.0 150.0 - 400.0 K/uL   Neutrophils Relative % 56.2 43.0 - 77.0 %   Lymphocytes Relative 27.9 12.0 - 46.0 %   Monocytes Relative 9.2 3.0 - 12.0 %   Eosinophils Relative 4.1 0.0 - 5.0 %   Basophils Relative 2.6 0.0 - 3.0 %   Neutro Abs 3.8 1.4 - 7.7 K/uL   Lymphs Abs 1.9 0.7 - 4.0 K/uL   Monocytes Absolute 0.6 0.1 - 1.0 K/uL   Eosinophils Absolute 0.3 0.0 - 0.7 K/uL   Basophils Absolute 0.2 (H) 0.0 - 0.1 K/uL  Parathyroid hormone, intact (no Ca)   Result Value Ref Range   PTH 72 16 - 77 pg/mL  Microalbumin / creatinine urine ratio  Result Value Ref Range   Microalb, Ur 2.4 (H) 0.0 - 1.9 mg/dL   Creatinine,U 78.2 mg/dL   Microalb Creat Ratio 5.9 0.0 - 30.0 mg/g  VITAMIN D 25 Hydroxy (Vit-D Deficiency, Fractures)  Result Value Ref Range   VITD 28.32 (L) 30.00 - 100.00 ng/mL  Phosphorus  Result Value Ref Range  Phosphorus 3.7 2.3 - 4.6 mg/dL  Comprehensive metabolic panel  Result Value Ref Range   Sodium 142 135 - 145 mEq/L   Potassium 4.4 3.5 - 5.1 mEq/L   Chloride 104 96 - 112 mEq/L   CO2 29 19 - 32 mEq/L   Glucose, Bld 104 (H) 70 - 99 mg/dL   BUN 19 6 - 23 mg/dL   Creatinine, Ser 4.09 0.40 - 1.20 mg/dL   Total Bilirubin 0.6 0.2 - 1.2 mg/dL   Alkaline Phosphatase 77 39 - 117 U/L   AST 18 0 - 37 U/L   ALT 17 0 - 35 U/L   Total Protein 6.9 6.0 - 8.3 g/dL   Albumin 4.2 3.5 - 5.2 g/dL   GFR 81.19 (L) >14.78 mL/min   Calcium 10.0 8.4 - 10.5 mg/dL  Lipid panel  Result Value Ref Range   Cholesterol 178 0 - 200 mg/dL   Triglycerides 295.6 0.0 - 149.0 mg/dL   HDL 21.30 >86.57 mg/dL   VLDL 84.6 0.0 - 96.2 mg/dL   LDL Cholesterol 86 0 - 99 mg/dL   Total CHOL/HDL Ratio 3    NonHDL 113.69   Hemoglobin A1c  Result Value Ref Range   Hgb A1c MFr Bld 6.1 4.6 - 6.5 %   EKG - ***  Assessment & Plan:   Problem List Items Addressed This Visit     Osteoarthritis   Relevant Orders   EKG 12-Lead   Prediabetes    Prediabetes range A1c over the past several years.  Will change diagnosis on problem list.       Relevant Orders   EKG 12-Lead   White coat syndrome with diagnosis of hypertension    Chronic, BP stable in office today. Continue current regimen.       CKD stage 3 due to type 2 diabetes mellitus (HCC)    Chronic, stable with latest GFR 48. Continue to monitor.       Chronic low back pain   Mitral regurgitation    Echo from 11/2017 showing mild MR Pt states murmur has been present since age 24s, pt asxs.        Pre-op evaluation - Primary    RCRI = 0 Recent labs reassuring. Update CXR, EKG today.  Anticipate normal results, anticipate low risk to proceed with planned surgical intervention under general anesthesia.  Check carotid US for L>R carotid bruit heard today - this need not delay surgical intervention.       Relevant Orders   DG Chest 2 View   EKG 12-Lead   Other Visit Diagnoses     Bilateral carotid bruits       Relevant Orders   VAS US CAROTID   EKG 12-Lead        No orders of the defined types were placed in this encounter.   Orders Placed This Encounter  Procedures   DG Chest 2 View    Standing Status:   Future    Standing Expiration Date:   11/18/2023    Order Specific Question:   Reason for Exam (SYMPTOM  OR DIAGNOSIS REQUIRED)    Answer:   preop eval    Order Specific Question:   Preferred imaging location?    Answer:   [facility]   EKG 12-Lead    Patient Instructions  Recent labs reassuring. EKG today  Chest xray today.  We will forward results to Dr Osborne Oman office.  I will order carotid ultrasounds through Dr Jari Sportsman office for  further evaluation of possible carotid artery stenosis.  Good to see you today.   Follow up plan: Return if symptoms worsen or fail to improve.  Eustaquio Boyden, MD

## 2022-11-18 NOTE — Assessment & Plan Note (Signed)
Echo from 11/2017 showing mild MR Pt states murmur has been present since age 82s, pt asxs.  No further evaluation necessary.

## 2022-11-18 NOTE — Assessment & Plan Note (Addendum)
Prediabetes range A1c over the past several years.  Will change diagnosis on problem list.

## 2022-11-19 DIAGNOSIS — R9431 Abnormal electrocardiogram [ECG] [EKG]: Secondary | ICD-10-CM | POA: Insufficient documentation

## 2022-11-19 NOTE — Assessment & Plan Note (Signed)
Upcoming surgery as per above.

## 2022-11-19 NOTE — Assessment & Plan Note (Addendum)
EKG today showing bradycardia, 1st degree AV block (avoid AV nodal blocking agents), LVH.

## 2022-11-24 ENCOUNTER — Ambulatory Visit: Payer: PPO | Admitting: Family Medicine

## 2022-11-24 ENCOUNTER — Encounter: Payer: Self-pay | Admitting: Cardiovascular Disease

## 2022-11-24 ENCOUNTER — Ambulatory Visit: Payer: PPO | Attending: Cardiovascular Disease | Admitting: Cardiovascular Disease

## 2022-11-24 ENCOUNTER — Encounter: Payer: Self-pay | Admitting: Family Medicine

## 2022-11-24 VITALS — BP 180/66 | HR 66 | Ht 64.0 in | Wt 136.1 lb

## 2022-11-24 VITALS — BP 174/74 | HR 65 | Temp 97.4°F | Ht 64.5 in | Wt 132.0 lb

## 2022-11-24 DIAGNOSIS — I10 Essential (primary) hypertension: Secondary | ICD-10-CM

## 2022-11-24 DIAGNOSIS — Z7189 Other specified counseling: Secondary | ICD-10-CM

## 2022-11-24 DIAGNOSIS — N1831 Chronic kidney disease, stage 3a: Secondary | ICD-10-CM | POA: Diagnosis not present

## 2022-11-24 DIAGNOSIS — E785 Hyperlipidemia, unspecified: Secondary | ICD-10-CM | POA: Diagnosis not present

## 2022-11-24 DIAGNOSIS — G8929 Other chronic pain: Secondary | ICD-10-CM | POA: Diagnosis not present

## 2022-11-24 DIAGNOSIS — L719 Rosacea, unspecified: Secondary | ICD-10-CM | POA: Diagnosis not present

## 2022-11-24 DIAGNOSIS — M545 Low back pain, unspecified: Secondary | ICD-10-CM

## 2022-11-24 DIAGNOSIS — Z0000 Encounter for general adult medical examination without abnormal findings: Secondary | ICD-10-CM | POA: Diagnosis not present

## 2022-11-24 DIAGNOSIS — I493 Ventricular premature depolarization: Secondary | ICD-10-CM

## 2022-11-24 DIAGNOSIS — Z01810 Encounter for preprocedural cardiovascular examination: Secondary | ICD-10-CM | POA: Diagnosis not present

## 2022-11-24 DIAGNOSIS — E1169 Type 2 diabetes mellitus with other specified complication: Secondary | ICD-10-CM

## 2022-11-24 DIAGNOSIS — E559 Vitamin D deficiency, unspecified: Secondary | ICD-10-CM | POA: Diagnosis not present

## 2022-11-24 DIAGNOSIS — Z853 Personal history of malignant neoplasm of breast: Secondary | ICD-10-CM

## 2022-11-24 DIAGNOSIS — M85832 Other specified disorders of bone density and structure, left forearm: Secondary | ICD-10-CM

## 2022-11-24 DIAGNOSIS — E119 Type 2 diabetes mellitus without complications: Secondary | ICD-10-CM

## 2022-11-24 DIAGNOSIS — R7303 Prediabetes: Secondary | ICD-10-CM

## 2022-11-24 DIAGNOSIS — Z66 Do not resuscitate: Secondary | ICD-10-CM | POA: Diagnosis not present

## 2022-11-24 MED ORDER — VITAMIN D3 25 MCG (1000 UT) PO CAPS: 1.0000 | ORAL_CAPSULE | Freq: Every day | ORAL | Status: AC

## 2022-11-24 MED ORDER — HYDRALAZINE HCL 50 MG PO TABS
50.0000 mg | ORAL_TABLET | Freq: Two times a day (BID) | ORAL | 4 refills | Status: DC
Start: 2022-11-24 — End: 2023-06-14

## 2022-11-24 MED ORDER — ONETOUCH DELICA LANCETS 33G MISC: 2 refills | Status: AC

## 2022-11-24 MED ORDER — LOVASTATIN 40 MG PO TABS
40.0000 mg | ORAL_TABLET | Freq: Every day | ORAL | 4 refills | Status: DC
Start: 2022-11-24 — End: 2024-01-25

## 2022-11-24 MED ORDER — ONETOUCH ULTRA VI STRP: ORAL_STRIP | 2 refills | Status: AC

## 2022-11-24 MED ORDER — OCUVITE-LUTEIN PO TABS: 1.0000 | ORAL_TABLET | Freq: Every day | ORAL | Status: AC

## 2022-11-24 NOTE — Assessment & Plan Note (Signed)
Continue calcium, vit D intake and work on regular weight exercise.
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
## 2022-11-24 NOTE — Assessment & Plan Note (Addendum)
Previously discussed Confirmed DNR status

## 2022-11-24 NOTE — Assessment & Plan Note (Signed)
Chronic, stable on lovastatin-continue this. The ASCVD Risk score (Arnett DK, et al., 2019) failed to calculate for the following reasons:   The 2019 ASCVD risk score is only valid for ages 45 to 70

## 2022-11-24 NOTE — Assessment & Plan Note (Signed)
Continue yearly mammograms

## 2022-11-24 NOTE — Assessment & Plan Note (Signed)
Chronic, present since prior to 2018.  Normal renal US 2015.  Anticipate HTN/DM related.  Reviewed good hydration status, avoiding NSAIDs

## 2022-11-24 NOTE — Assessment & Plan Note (Signed)
A1c remaining in prediabetes range over last several years.

## 2022-11-24 NOTE — Assessment & Plan Note (Addendum)
Chronic, BP elevated in office again today - pt attributes to increased back pain since increased walking yesterday. Recheck prior to leaving office BP remains high.

## 2022-11-24 NOTE — Assessment & Plan Note (Signed)
Preventative protocols reviewed and updated unless pt declined. Discussed healthy diet and lifestyle.  

## 2022-11-24 NOTE — Patient Instructions (Addendum)
Good to see you today Double check BP when you get home as it's staying elevated, but partly attributed to worse back pain.  Return as needed or in 6 months for follow up visit

## 2022-11-24 NOTE — Progress Notes (Addendum)
Ph: [phone] Fax: [phone]   Patient ID: Paige Medina, female    DOB: 05-03-1933, 89 y.o.   MRN: [id number]  This visit was conducted in person.  BP (!) 174/74   Pulse 65   Temp (!) 97.4 F (36.3 C) (Temporal)   Ht 5' 4.5" (1.638 m)   Wt 132 lb (59.9 kg)   SpO2 98%   BMI 22.31 kg/m   BP Readings from Last 3 Encounters:  11/24/22 (!) 180/66  11/24/22 (!) 174/74  11/18/22 132/82   BP remains high on recheck   CC: CPE Subjective:   HPI: Paige Medina is a 89 y.o. female presenting on 11/24/2022 for Annual Exam (MCR prt 2 [AWV- 11/17/22].)   Saw health advisor last week for medicare wellness visit. Note reviewed.   No results found.  Flowsheet Row Office Visit from 11/18/2022 in [facility] [place]  PHQ-2 Total Score 0          11/18/2022   10:40 AM 11/10/2022   11:31 AM 11/06/2021    2:03 PM 11/05/2020    8:59 AM 11/01/2019    3:04 PM  Fall Risk   Falls in the past year? 1 1 0 0 0  Number falls in past yr: 0 0 0  0  Injury with Fall?  1 0  0  Risk for fall due to :  History of fall(s);Impaired mobility No Fall Risks    Follow up Falls evaluation completed;Falls prevention discussed Falls prevention discussed;Education provided;Falls evaluation completed Falls evaluation completed  Falls evaluation completed  Tripped over husband 08/25/2022.   Living in [place].   Upcoming L3/4 posterior spinal decompression and L L5/S1 microdiscectomy under general anesthesia by Dr Marcell Barlow scheduled for 12/14/2022. Completed pre-op evaluation last week. Pending carotid ultrasound for eval of L>R carotid bruit heard.   White coat hypertension - office BP runs high - Home BPs run well controlled last week 128/72. Walked a lot yesterday while shopping. Attributes today's elevated BP to increased back pain.    Preventative:   COLONOSCOPY 12/2014 mod diverticulosis o/w WNL, f/u prn Marina Goodell). No blood in stool or bowel changes since.  Well  woman - s/p hysterectomy 1977, ovaries remain. Denies sxs.  Mammogram 11/2021 [facility]1 @ [place].  DEXA 01/2018 - T -1.3 LFN, -2.2 L forearm, hip fracture risk 3.3% (elevated)  DEXA 11/2021 - T -2.1 L forearm, at increased hip fracture risk (3.9%)  Lung cancer screening - not eligible  Flu yearly  COVID vaccine - Moderna 03/2019, 04/2019, booster 12/2019, 06/2020, 10/202, 10/2021  Pneumovax 06/2005. Prevnar-13 2015  Td 06/2005  RSV - 12/2021 Shingrix - discussed, not interested.  Advanced directives: Scanned into chart 10/2014. Onalee Hua husband then Revonda Standard and Cletis Athens are [facility]. Does not want prolonged life support if terminal or irreversible condition. Confirmed DNR status.  Seat belt use discussed.  Sunscreen use discussed. No changing moles on skin. New growth to left shoulder.  Sleep - averages 7-8 hours Non smoker  Alcohol - rare  Dentist Q6 mo  Eye exam - yearly  Bowel - chronic constipation managed with daily stool softener and pre/probiotic, occasional miralax  Bladder - no incontinence   Lives with husband, no pets   Grown children   Occupation: retired, varied jobs Investment banker, corporate   Activity: gym 3d/wk, active mentally with crosswords   Diet: good water, fruits/vegetables daily      Relevant past medical, surgical, family and social history reviewed and updated  as indicated. Interim medical history since our last visit reviewed. Allergies and medications reviewed and updated. Outpatient Medications Prior to Visit  Medication Sig Dispense Refill   b complex vitamins capsule Take 1 capsule by mouth daily.     Blood Glucose Monitoring Suppl (ONE TOUCH ULTRA 2) w/Device KIT Use as directed 1 each 0   Calcium Carb-Cholecalciferol (CALTRATE 600+D3) 600-20 MG-MCG TABS Take 1 tablet by mouth daily.     clobetasol (TEMOVATE) 0.05 % external solution APPLY SMALL AMOUNT TOPICALLY TO THE AFFECTED AREA ONCE DAILY UP TO 5 DAYS WEEKLY 50 mL 3   Coenzyme Q10 (CO Q-10) 200 MG CAPS Take 200 mg  by mouth every morning.      docusate sodium (COLACE) 100 MG capsule Take 100 mg by mouth 2 (two) times daily.     doxycycline (VIBRA-TABS) 100 MG tablet Take 1 tablet (100 mg total) by mouth daily as needed (rosacea flare).     metroNIDAZOLE (METROGEL) 0.75 % gel APPLY SMALL AMOUNT TOPICALLY TO THE AFFECTED AREA 1 TO 2 TIMES DAILY AS DIRECTED 45 g 11   olmesartan (BENICAR) 40 MG tablet Take 1 tablet (40 mg total) by mouth daily. 90 tablet 0   triamcinolone cream (KENALOG) 0.1 % APPLY TOPICALLY TO THE AFFECTED AREA TWICE DAILY AS NEEDED 30 g 1   beta carotene w/minerals (OCUVITE) tablet Take 1 tablet by mouth every other day.     glucose blood (ONETOUCH ULTRA) test strip Use as instructed to check blood sugar once daily. 100 each 2   hydrALAZINE (APRESOLINE) 50 MG tablet TAKE 1 TABLET(50 MG) BY MOUTH IN THE MORNING AND AT BEDTIME 60 tablet 0   lovastatin (MEVACOR) 40 MG tablet Take 1 tablet (40 mg total) by mouth daily. 90 tablet 3   OneTouch Delica Lancets 33G MISC Use as instructed to check blood sugar once daily. 100 each 2   No facility-administered medications prior to visit.     Per HPI unless specifically indicated in ROS section below Review of Systems  Constitutional:  Negative for activity change, appetite change, chills, fatigue, fever and unexpected weight change.  HENT:  Negative for hearing loss.   Eyes:  Negative for visual disturbance.  Respiratory:  Negative for cough, chest tightness, shortness of breath and wheezing.   Cardiovascular:  Negative for chest pain, palpitations and leg swelling.  Gastrointestinal:  Positive for constipation. Negative for abdominal distention, abdominal pain, blood in stool, diarrhea, nausea and vomiting.  Genitourinary:  Negative for difficulty urinating and hematuria.  Musculoskeletal:  Negative for arthralgias, myalgias and neck pain.  Skin:  Negative for rash.  Neurological:  Negative for dizziness, seizures, syncope and headaches.   Hematological:  Negative for adenopathy. Bruises/bleeds easily.  Psychiatric/Behavioral:  Negative for dysphoric mood. The patient is not nervous/anxious.     Objective:  BP (!) 174/74   Pulse 65   Temp (!) 97.4 F (36.3 C) (Temporal)   Ht 5' 4.5" (1.638 m)   Wt 132 lb (59.9 kg)   SpO2 98%   BMI 22.31 kg/m   Wt Readings from Last 3 Encounters:  11/24/22 136 lb 2 oz (61.7 kg)  11/24/22 132 lb (59.9 kg)  11/18/22 131 lb 12.8 oz (59.8 kg)      Physical Exam Vitals and nursing note reviewed.  Constitutional:      Appearance: Normal appearance. She is not ill-appearing.  HENT:     Head: Normocephalic and atraumatic.     Right Ear: Tympanic membrane, ear canal and  external ear normal. There is no impacted cerumen.     Left Ear: Tympanic membrane, ear canal and external ear normal. There is no impacted cerumen.     Mouth/Throat:     Mouth: Mucous membranes are moist.     Pharynx: Oropharynx is clear. No oropharyngeal exudate or posterior oropharyngeal erythema.  Eyes:     General:        Right eye: No discharge.        Left eye: No discharge.     Extraocular Movements: Extraocular movements intact.     Conjunctiva/sclera: Conjunctivae normal.     Pupils: Pupils are equal, round, and reactive to light.  Neck:     Thyroid: No thyroid mass or thyromegaly.     Vascular: Carotid bruit (mild L sided) present.  Cardiovascular:     Rate and Rhythm: Normal rate and regular rhythm.     Pulses: Normal pulses.     Heart sounds: Normal heart sounds. No murmur heard. Pulmonary:     Effort: Pulmonary effort is normal. No respiratory distress.     Breath sounds: Normal breath sounds. No wheezing, rhonchi or rales.  Abdominal:     General: Bowel sounds are normal. There is no distension.     Palpations: Abdomen is soft. There is no mass.     Tenderness: There is no abdominal tenderness. There is no guarding or rebound.     Hernia: No hernia is present.  Musculoskeletal:     Cervical  back: Normal range of motion and neck supple. No rigidity.     Right lower leg: No edema.     Left lower leg: No edema.  Lymphadenopathy:     Cervical: No cervical adenopathy.  Skin:    General: Skin is warm and dry.     Findings: No rash.  Neurological:     General: No focal deficit present.     Mental Status: She is alert. Mental status is at baseline.  Psychiatric:        Mood and Affect: Mood normal.        Behavior: Behavior normal.       Results for orders placed or performed in visit on 11/17/22  CBC with Differential/Platelet  Result Value Ref Range   WBC 6.7 4.0 - 10.5 K/uL   RBC 4.30 3.87 - 5.11 Mil/uL   Hemoglobin 13.4 12.0 - 15.0 g/dL   HCT 84.1 32.4 - 40.1 %   MCV 96.5 78.0 - 100.0 fl   MCHC 32.3 30.0 - 36.0 g/dL   RDW 02.7 25.3 - 66.4 %   Platelets 260.0 150.0 - 400.0 K/uL   Neutrophils Relative % 56.2 43.0 - 77.0 %   Lymphocytes Relative 27.9 12.0 - 46.0 %   Monocytes Relative 9.2 3.0 - 12.0 %   Eosinophils Relative 4.1 0.0 - 5.0 %   Basophils Relative 2.6 0.0 - 3.0 %   Neutro Abs 3.8 1.4 - 7.7 K/uL   Lymphs Abs 1.9 0.7 - 4.0 K/uL   Monocytes Absolute 0.6 0.1 - 1.0 K/uL   Eosinophils Absolute 0.3 0.0 - 0.7 K/uL   Basophils Absolute 0.2 (H) 0.0 - 0.1 K/uL  Parathyroid hormone, intact (no Ca)  Result Value Ref Range   PTH 72 16 - 77 pg/mL  Microalbumin / creatinine urine ratio  Result Value Ref Range   Microalb, Ur 2.4 (H) 0.0 - 1.9 mg/dL   Creatinine,U 40.3 mg/dL   Microalb Creat Ratio 5.9 0.0 - 30.0 mg/g  VITAMIN  D 25 Hydroxy (Vit-D Deficiency, Fractures)  Result Value Ref Range   VITD 28.32 (L) 30.00 - 100.00 ng/mL  Phosphorus  Result Value Ref Range   Phosphorus 3.7 2.3 - 4.6 mg/dL  Comprehensive metabolic panel  Result Value Ref Range   Sodium 142 135 - 145 mEq/L   Potassium 4.4 3.5 - 5.1 mEq/L   Chloride 104 96 - 112 mEq/L   CO2 29 19 - 32 mEq/L   Glucose, Bld 104 (H) 70 - 99 mg/dL   BUN 19 6 - 23 mg/dL   Creatinine, Ser 4.09 0.40 - 1.20  mg/dL   Total Bilirubin 0.6 0.2 - 1.2 mg/dL   Alkaline Phosphatase 77 39 - 117 U/L   AST 18 0 - 37 U/L   ALT 17 0 - 35 U/L   Total Protein 6.9 6.0 - 8.3 g/dL   Albumin 4.2 3.5 - 5.2 g/dL   GFR 81.19 (L) >14.78 mL/min   Calcium 10.0 8.4 - 10.5 mg/dL  Lipid panel  Result Value Ref Range   Cholesterol 178 0 - 200 mg/dL   Triglycerides 295.6 0.0 - 149.0 mg/dL   HDL 21.30 >86.57 mg/dL   VLDL 84.6 0.0 - 96.2 mg/dL   LDL Cholesterol 86 0 - 99 mg/dL   Total CHOL/HDL Ratio 3    NonHDL 113.69   Hemoglobin A1c  Result Value Ref Range   Hgb A1c MFr Bld 6.1 4.6 - 6.5 %   Lab Results  Component Value Date   TSH 1.82 06/23/2022    Assessment & Plan:   Problem List Items Addressed This Visit     Advanced care planning/counseling discussion (Chronic)    Previously discussed Confirmed DNR status       Health maintenance examination - Primary (Chronic)    Preventative protocols reviewed and updated unless pt declined. Discussed healthy diet and lifestyle.       DNR (do not resuscitate) (Chronic)   History of breast cancer    Continue yearly mammograms.      Prediabetes    A1c remaining in prediabetes range over last several years.       Relevant Medications   glucose blood (ONETOUCH ULTRA) test strip   OneTouch Delica Lancets 33G MISC   White coat syndrome with diagnosis of hypertension    Chronic, BP elevated in office again today - pt attributes to increased back pain since increased walking yesterday. Recheck prior to leaving office BP remains high.       Relevant Medications   hydrALAZINE (APRESOLINE) 50 MG tablet   lovastatin (MEVACOR) 40 MG tablet   Hyperlipidemia    Chronic, stable on lovastatin - continue this. The ASCVD Risk score (Arnett DK, et al., 2019) failed to calculate for the following reasons:   The 2019 ASCVD risk score is only valid for ages 60 to 19       Relevant Medications   hydrALAZINE (APRESOLINE) 50 MG tablet   lovastatin (MEVACOR) 40 MG  tablet   Rosacea    Appreciate derm care.       CKD (chronic kidney disease) stage 3, GFR 30-59 ml/min (HCC)    Chronic, present since prior to 2018.  Normal renal US 2015.  Anticipate HTN/DM related.  Reviewed good hydration status, avoiding NSAIDs      Osteopenia of left forearm    Continue calcium, vit D intake and work on regular weight exercise.       Chronic low back pain    Upcoming surgery.  Vitamin D deficiency    She recently started vit D 1000 international units  daily.         Meds ordered this encounter  Medications   hydrALAZINE (APRESOLINE) 50 MG tablet    Sig: Take 1 tablet (50 mg total) by mouth in the morning and at bedtime.    Dispense:  180 tablet    Refill:  4   lovastatin (MEVACOR) 40 MG tablet    Sig: Take 1 tablet (40 mg total) by mouth daily.    Dispense:  90 tablet    Refill:  4   Cholecalciferol (VITAMIN D3) 25 MCG (1000 UT) CAPS    Sig: Take 1 capsule (1,000 Units total) by mouth daily.   beta carotene w/minerals (OCUVITE) tablet    Sig: Take 1 tablet by mouth daily.   glucose blood (ONETOUCH ULTRA) test strip    Sig: Use as instructed to check blood sugar once daily.    Dispense:  100 each    Refill:  2   OneTouch Delica Lancets 33G MISC    Sig: Use as instructed to check blood sugar once daily.    Dispense:  100 each    Refill:  2    No orders of the defined types were placed in this encounter.   Patient Instructions  Good to see you today Double check BP when you get home as it's staying elevated, but partly attributed to worse back pain.  Return as needed or in 6 months for follow up visit   Follow up plan: Return in about 6 months (around 05/24/2023) for follow up visit.  Eustaquio Boyden, MD

## 2022-11-24 NOTE — Assessment & Plan Note (Signed)
Upcoming surgery.

## 2022-11-24 NOTE — Assessment & Plan Note (Signed)
She recently started vit D 1000 international units  daily.

## 2022-11-24 NOTE — Assessment & Plan Note (Signed)
Appreciate derm care.

## 2022-11-24 NOTE — Patient Instructions (Signed)
Medication Instructions:  No changes *If you need a refill on your cardiac medications before your next appointment, please call your pharmacy*   Lab Work: None ordered If you have labs (blood work) drawn today and your tests are completely normal, you will receive your results only by: MyChart Message (if you have MyChart) OR A paper copy in the mail If you have any lab test that is abnormal or we need to change your treatment, we will call you to review the results.   Testing/Procedures: None ordered   Follow-Up: At [facility], you and your health needs are our priority.  As part of our continuing mission to provide you with exceptional heart care, we have created designated Provider Care Teams.  These Care Teams include your primary Cardiologist (physician) and Advanced Practice Providers (APPs -  Physician Assistants and Nurse Practitioners) who all work together to provide you with the care you need, when you need it.  We recommend signing up for the patient portal called "MyChart".  Sign up information is provided on this After Visit Summary.  MyChart is used to connect with patients for Virtual Visits (Telemedicine).  Patients are able to view lab/test results, encounter notes, upcoming appointments, etc.  Non-urgent messages can be sent to your provider as well.   To learn more about what you can do with MyChart, go to [email or web address].    Your next appointment:   12 month(s)  Provider:   You may see Muhammad Arida, MD or one of the following Advanced Practice Providers on your designated Care Team:   Christopher Berge, NP Ryan Dunn, PA-C Cadence Furth, PA-C Sheri Hammock, NP    

## 2022-12-04 ENCOUNTER — Inpatient Hospital Stay: Admission: RE | Admit: 2022-12-04 | Payer: PPO | Source: Ambulatory Visit

## 2022-12-05 ENCOUNTER — Other Ambulatory Visit: Payer: Self-pay | Admitting: Cardiovascular Disease

## 2022-12-07 ENCOUNTER — Telehealth: Payer: Self-pay | Admitting: Family Medicine

## 2022-12-07 NOTE — Telephone Encounter (Signed)
No Auth. Req. I have made Paige Medina aware of this... ELEA

## 2022-12-07 NOTE — Telephone Encounter (Signed)
Gwen from [facility] contacted the office regarding authorization for procedure patient has scheduled on 10/8. Caller was unable to find authorization, I was not able to either. Sending this message for an update on authorization status for this procedure. Please advise [phone] when completed. Thank you!

## 2022-12-08 ENCOUNTER — Other Ambulatory Visit: Payer: Self-pay

## 2022-12-08 ENCOUNTER — Inpatient Hospital Stay
Admission: RE | Admit: 2022-12-08 | Discharge: 2022-12-08 | Disposition: A | Discharge status: Home / Self Care | Payer: PPO | Source: Ambulatory Visit | Attending: Neurosurgery | Admitting: Neurosurgery

## 2022-12-08 ENCOUNTER — Ambulatory Visit: Payer: PPO | Attending: Family Medicine

## 2022-12-08 VITALS — HR 65 | Resp 9 | Ht 64.0 in | Wt 138.0 lb

## 2022-12-08 DIAGNOSIS — R0989 Other specified symptoms and signs involving the circulatory and respiratory systems: Secondary | ICD-10-CM

## 2022-12-08 DIAGNOSIS — Z01812 Encounter for preprocedural laboratory examination: Secondary | ICD-10-CM | POA: Insufficient documentation

## 2022-12-08 DIAGNOSIS — Z01818 Encounter for other preprocedural examination: Secondary | ICD-10-CM | POA: Diagnosis present

## 2022-12-08 DIAGNOSIS — R7303 Prediabetes: Secondary | ICD-10-CM | POA: Diagnosis not present

## 2022-12-08 LAB — URINALYSIS, COMPLETE (UACMP) WITH MICROSCOPIC
Bacteria, UA: NONE SEEN
Bilirubin Urine: NEGATIVE
Glucose, UA: NEGATIVE mg/dL
Hgb urine dipstick: NEGATIVE
Ketones, ur: NEGATIVE mg/dL
Leukocytes,Ua: NEGATIVE
Nitrite: NEGATIVE
Protein, ur: NEGATIVE mg/dL
RBC / HPF: 0 RBC/hpf (ref 0–5)
Specific Gravity, Urine: 1.008 (ref 1.005–1.030)
Squamous Epithelial / HPF: 0 /[HPF] (ref 0–5)
pH: 5 (ref 5.0–8.0)

## 2022-12-08 LAB — TYPE AND SCREEN
ABO/RH(D): A POS
Antibody Screen: NEGATIVE

## 2022-12-08 LAB — SURGICAL PCR SCREEN
MRSA, PCR: NEGATIVE
Staphylococcus aureus: NEGATIVE

## 2022-12-08 NOTE — Pre-Procedure Instructions (Addendum)
Prior converston with Dr Myer Haff and noted Plan on H&P  Assessment and Plan: Paige Medina is a pleasant 89 y.o. female with L4-5 spondylolisthesis with neurogenic claudication secondary to lumbar stenosis from L3-S1.  She also has a disc extrusion at L5-S1 causing left-sided nerve root compression.   She has tried and failed conservative management including physical therapy, medications, and injections.  At this point, no further conservative management is indicated.   We discussed the options, which include L3-S1 decompression with left L5-S1 microdiscectomy versus L3-4 decompression, L4-5 lateral lumbar interbody fusion, and left L5-S1 microdiscectomy.  We reviewed that the primary goal would be to alleviate her lower extremity symptoms.  She would like to move forward with L3-S1 decompression with left-sided L5-S1 microdiscectomy.  I think this is a reasonable consideration to improve her condition.  Vs. Order for Consent  Question Answer  Physician/Practitioner attestation of informed consent for procedure/surgical case I, the physician/practitioner, attest that I have discussed with the patient the benefits, risks, side effects, alternatives, likelihood of achieving goals and potential problems during recovery for the procedure that I have provided informed consent.  Procedure L3-5 posterior spinal decompression, left L5-S1 microdiscectomy  Physician/Practitioner performing the procedure Venetia Night, MD  Indication/Reason leg pain    Vs. OR Posting  Procedure Times Procedures Providers Loc / Dept Status      12/14/2022 0938 [id number] L3-5 POSTERIOR SPINAL DECOMPRESSION, LEFT L5-S1 MICRODISCECTOMY - Left Venetia Night [facility] ORS [facility]  Open case   All Tasks Complete?: No     Case #: [id number]

## 2022-12-08 NOTE — Patient Instructions (Signed)
Your procedure is scheduled on: Monday 12/14/22 To find out your arrival time, please call [phone] between 1PM - 3PM on:  Friday 12/11/22  Report to the Registration Desk on the 1st floor of the [facility]. Free Valet parking is available.  If your arrival time is 6:00 am, do not arrive before that time as the [facility] entrance doors do not open until 6:00 am.  REMEMBER: Instructions that are not followed completely may result in serious medical risk, up to and including death; or upon the discretion of your surgeon and anesthesiologist your surgery may need to be rescheduled.  Do not eat food after midnight the night before surgery.  No gum chewing or hard candies.  You may however, drink CLEAR liquids up to 2 hours before you are scheduled to arrive for your surgery. Do not drink anything within 2 hours of your scheduled arrival time.  Clear liquids include: - water  - apple juice without pulp - gatorade (not RED colors) - black coffee or tea (Do NOT add milk or creamers to the coffee or tea) Do NOT drink anything that is not on this list.  Type 1 and Type 2 diabetics should only drink water.  One week prior to surgery: Stop Anti-inflammatories (NSAIDS) such as Advil, Aleve, Ibuprofen, Motrin, Naproxen, Naprosyn and Aspirin based products such as Excedrin, Goody's Powder, BC Powder. You may however, continue to take Tylenol if needed for pain up until the day of surgery.  Stop ANY OVER THE COUNTER supplements or vitamins for 7 days until after surgery.  Continue taking all prescribed medications.   TAKE ONLY THESE MEDICATIONS THE MORNING OF SURGERY WITH A SIP OF WATER:  hydrALAZINE (APRESOLINE) 50 MG tablet   No Alcohol for 24 hours before or after surgery.  No Smoking including e-cigarettes for 24 hours before surgery.  No chewable tobacco products for at least 6 hours before surgery.  No nicotine patches on the day of surgery.  Do not use any "recreational"  drugs for at least a week (preferably 2 weeks) before your surgery.  Please be advised that the combination of cocaine and anesthesia may have negative outcomes, up to and including death. If you test positive for cocaine, your surgery will be cancelled.  On the morning of surgery brush your teeth with toothpaste and water, you may rinse your mouth with mouthwash if you wish. Do not swallow any toothpaste or mouthwash.  Use CHG Soap or wipes as directed on instruction sheet. Shower with CHG soap daily for 5 days beginning Thursday 12/10/22  Do not wear lotions, powders, or perfumes.   Do not shave body hair from the neck down 48 hours before surgery.  Wear comfortable clothing (specific to your surgery type) to the hospital.  Do not wear jewelry, make-up, hairpins, clips or nail polish.  For welded (permanent) jewelry: bracelets, anklets, waist bands, etc.  Please have this removed prior to surgery.  If it is not removed, there is a chance that hospital personnel will need to cut it off on the day of surgery. Contact lenses, hearing aids and dentures may not be worn into surgery.  Do not bring valuables to the hospital. [facility] is not responsible for any missing/lost belongings or valuables.   Notify your doctor if there is any change in your medical condition (cold, fever, infection).  If you are being discharged the day of surgery, you will not be allowed to drive home. You will need a responsible individual  to drive you home and stay with you for 24 hours after surgery.   If you are taking public transportation, you will need to have a responsible individual with you.  If you are being admitted to the hospital overnight, leave your suitcase in the car. After surgery it may be brought to your room.  In case of increased patient census, it may be necessary for you, the patient, to continue your postoperative care in the Same Day Surgery department.  After surgery, you can help  prevent lung complications by doing breathing exercises.  Take deep breaths and cough every 1-2 hours. Your doctor may order a device called an Incentive Spirometer to help you take deep breaths. When coughing or sneezing, hold a pillow firmly against your incision with both hands. This is called "splinting." Doing this helps protect your incision. It also decreases belly discomfort.  Surgery Visitation Policy:  Patients undergoing a surgery or procedure may have two family members or support persons with them as long as the person is not COVID-19 positive or experiencing its symptoms.   Inpatient Visitation:    Visiting hours are 7 a.m. to 8 p.m. Up to four visitors are allowed at one time in a patient room. The visitors may rotate out with other people during the day. One designated support person (adult) may remain overnight.  Please call the Pre-admissions Testing Dept. at [phone] if you have any questions about these instructions.    Pre-operative 5 CHG Bath Instructions   You can play a key role in reducing the risk of infection after surgery. Your skin needs to be as free of germs as possible. You can reduce the number of germs on your skin by washing with CHG (chlorhexidine gluconate) soap before surgery. CHG is an antiseptic soap that kills germs and continues to kill germs even after washing.   DO NOT use if you have an allergy to chlorhexidine/CHG or antibacterial soaps. If your skin becomes reddened or irritated, stop using the CHG and notify one of our RNs at [phone].   Please shower with the CHG soap starting 4 days before surgery using the following schedule:     Please keep in mind the following:  DO NOT shave, including legs and underarms, starting the day of your first shower.   You may shave your face at any point before/day of surgery.  Place clean sheets on your bed the day you start using CHG soap. Use a clean washcloth (not used since being washed)  for each shower. DO NOT sleep with pets once you start using the CHG.   CHG Shower Instructions:  If you choose to wash your hair and private area, wash first with your normal shampoo/soap.  After you use shampoo/soap, rinse your hair and body thoroughly to remove shampoo/soap residue.  Turn the water OFF and apply about 3 tablespoons (45 ml) of CHG soap to a CLEAN washcloth.  Apply CHG soap ONLY FROM YOUR NECK DOWN TO YOUR TOES (washing for 3-5 minutes)  DO NOT use CHG soap on face, private areas, open wounds, or sores.  Pay special attention to the area where your surgery is being performed.  If you are having back surgery, having someone wash your back for you may be helpful. Wait 2 minutes after CHG soap is applied, then you may rinse off the CHG soap.  Pat dry with a clean towel  Put on clean clothes/pajamas   If you choose to wear lotion, please use  ONLY the CHG-compatible lotions on the back of this paper.     Additional instructions for the day of surgery: DO NOT APPLY any lotions, deodorants, cologne, or perfumes.   Put on clean/comfortable clothes.  Brush your teeth.  Ask your nurse before applying any prescription medications to the skin.      CHG Compatible Lotions   Aveeno Moisturizing lotion  Cetaphil Moisturizing Cream  Cetaphil Moisturizing Lotion  Clairol Herbal Essence Moisturizing Lotion, Dry Skin  Clairol Herbal Essence Moisturizing Lotion, Extra Dry Skin  Clairol Herbal Essence Moisturizing Lotion, Normal Skin  Curel Age Defying Therapeutic Moisturizing Lotion with Alpha Hydroxy  Curel Extreme Care Body Lotion  Curel Soothing Hands Moisturizing Hand Lotion  Curel Therapeutic Moisturizing Cream, Fragrance-Free  Curel Therapeutic Moisturizing Lotion, Fragrance-Free  Curel Therapeutic Moisturizing Lotion, Original Formula  Eucerin Daily Replenishing Lotion  Eucerin Dry Skin Therapy Plus Alpha Hydroxy Crme  Eucerin Dry Skin Therapy Plus Alpha Hydroxy  Lotion  Eucerin Original Crme  Eucerin Original Lotion  Eucerin Plus Crme Eucerin Plus Lotion  Eucerin TriLipid Replenishing Lotion  Keri Anti-Bacterial Hand Lotion  Keri Deep Conditioning Original Lotion Dry Skin Formula Softly Scented  Keri Deep Conditioning Original Lotion, Fragrance Free Sensitive Skin Formula  Keri Lotion Fast Absorbing Fragrance Free Sensitive Skin Formula  Keri Lotion Fast Absorbing Softly Scented Dry Skin Formula  Keri Original Lotion  Keri Skin Renewal Lotion Keri Silky Smooth Lotion  Keri Silky Smooth Sensitive Skin Lotion  Nivea Body Creamy Conditioning Oil  Nivea Body Extra Enriched Teacher, adult education Moisturizing Lotion Nivea Crme  Nivea Skin Firming Lotion  NutraDerm 30 Skin Lotion  NutraDerm Skin Lotion  NutraDerm Therapeutic Skin Cream  NutraDerm Therapeutic Skin Lotion  ProShield Protective Hand Cream  Provon moisturizing lotion

## 2022-12-10 DIAGNOSIS — C50212 Malignant neoplasm of upper-inner quadrant of left female breast: Secondary | ICD-10-CM | POA: Diagnosis not present

## 2022-12-11 ENCOUNTER — Ambulatory Visit
Admission: RE | Admit: 2022-12-11 | Discharge: 2022-12-11 | Disposition: A | Discharge status: Home / Self Care | Payer: PPO | Source: Ambulatory Visit | Attending: General Surgery | Admitting: General Surgery

## 2022-12-11 DIAGNOSIS — Z1231 Encounter for screening mammogram for malignant neoplasm of breast: Secondary | ICD-10-CM | POA: Insufficient documentation

## 2022-12-13 MED ORDER — CEFAZOLIN SODIUM-DEXTROSE 2-4 GM/100ML-% IV SOLN
2.0000 g | INTRAVENOUS | Status: AC
  Administered 2022-12-14: 2 g via INTRAVENOUS

## 2022-12-13 MED ORDER — FAMOTIDINE 20 MG PO TABS
20.0000 mg | ORAL_TABLET | Freq: Once | ORAL | Status: AC
  Administered 2022-12-14: 20 mg via ORAL

## 2022-12-13 MED ORDER — CHLORHEXIDINE GLUCONATE 0.12 % MT SOLN
15.0000 mL | Freq: Once | OROMUCOSAL | Status: AC
  Administered 2022-12-14: 15 mL via OROMUCOSAL

## 2022-12-13 MED ORDER — CEFAZOLIN IN SODIUM CHLORIDE 2-0.9 GM/100ML-% IV SOLN
2.0000 g | Freq: Once | INTRAVENOUS | Status: DC
  Filled 2022-12-13: qty 100

## 2022-12-13 MED ORDER — ORAL CARE MOUTH RINSE: 15.0000 mL | Freq: Once | OROMUCOSAL | Status: AC

## 2022-12-14 ENCOUNTER — Encounter: Payer: Self-pay | Admitting: Neurosurgery

## 2022-12-14 ENCOUNTER — Other Ambulatory Visit: Payer: Self-pay

## 2022-12-14 ENCOUNTER — Ambulatory Visit: Payer: PPO | Admitting: Urgent Care

## 2022-12-14 ENCOUNTER — Observation Stay
Admission: RE | Admit: 2022-12-14 | Discharge: 2022-12-15 | Disposition: A | Discharge status: Home / Self Care | Payer: PPO | Attending: Neurosurgery | Admitting: Neurosurgery

## 2022-12-14 ENCOUNTER — Encounter
Admission: RE | Disposition: A | Discharge status: Home / Self Care | Payer: Self-pay | Source: Home / Self Care | Attending: Neurosurgery

## 2022-12-14 ENCOUNTER — Ambulatory Visit: Payer: PPO

## 2022-12-14 DIAGNOSIS — Z853 Personal history of malignant neoplasm of breast: Secondary | ICD-10-CM | POA: Diagnosis not present

## 2022-12-14 DIAGNOSIS — M48062 Spinal stenosis, lumbar region with neurogenic claudication: Secondary | ICD-10-CM | POA: Diagnosis not present

## 2022-12-14 DIAGNOSIS — Z96652 Presence of left artificial knee joint: Secondary | ICD-10-CM | POA: Insufficient documentation

## 2022-12-14 DIAGNOSIS — N189 Chronic kidney disease, unspecified: Secondary | ICD-10-CM | POA: Insufficient documentation

## 2022-12-14 DIAGNOSIS — Z01818 Encounter for other preprocedural examination: Principal | ICD-10-CM

## 2022-12-14 DIAGNOSIS — M5416 Radiculopathy, lumbar region: Secondary | ICD-10-CM | POA: Diagnosis not present

## 2022-12-14 DIAGNOSIS — R7303 Prediabetes: Secondary | ICD-10-CM

## 2022-12-14 DIAGNOSIS — I129 Hypertensive chronic kidney disease with stage 1 through stage 4 chronic kidney disease, or unspecified chronic kidney disease: Secondary | ICD-10-CM | POA: Insufficient documentation

## 2022-12-14 DIAGNOSIS — M5117 Intervertebral disc disorders with radiculopathy, lumbosacral region: Secondary | ICD-10-CM | POA: Diagnosis not present

## 2022-12-14 DIAGNOSIS — E1122 Type 2 diabetes mellitus with diabetic chronic kidney disease: Secondary | ICD-10-CM | POA: Diagnosis not present

## 2022-12-14 DIAGNOSIS — Z91040 Latex allergy status: Secondary | ICD-10-CM | POA: Diagnosis not present

## 2022-12-14 DIAGNOSIS — Z96698 Presence of other orthopedic joint implants: Secondary | ICD-10-CM | POA: Diagnosis not present

## 2022-12-14 DIAGNOSIS — Z85828 Personal history of other malignant neoplasm of skin: Secondary | ICD-10-CM | POA: Insufficient documentation

## 2022-12-14 DIAGNOSIS — M48061 Spinal stenosis, lumbar region without neurogenic claudication: Secondary | ICD-10-CM | POA: Diagnosis present

## 2022-12-14 HISTORY — PX: LUMBAR LAMINECTOMY/DECOMPRESSION MICRODISCECTOMY: SHX5026

## 2022-12-14 LAB — ABO/RH: ABO/RH(D): A POS

## 2022-12-14 LAB — GLUCOSE, CAPILLARY
Glucose-Capillary: 143 mg/dL — ABNORMAL HIGH (ref 70–99)
Glucose-Capillary: 174 mg/dL — ABNORMAL HIGH (ref 70–99)
Glucose-Capillary: 155 mg/dL — ABNORMAL HIGH (ref 70–99)

## 2022-12-14 SURGERY — LUMBAR LAMINECTOMY/DECOMPRESSION MICRODISCECTOMY 3 LEVELS
Anesthesia: General | Site: Back | Laterality: Left

## 2022-12-14 MED ORDER — HYDROCODONE-ACETAMINOPHEN 5-325 MG PO TABS
ORAL_TABLET | ORAL | Status: AC
  Filled 2022-12-14: qty 1

## 2022-12-14 MED ORDER — SODIUM CHLORIDE FLUSH 0.9 % IV SOLN
INTRAVENOUS | Status: AC
  Filled 2022-12-14: qty 20

## 2022-12-14 MED ORDER — METHYLPREDNISOLONE ACETATE 40 MG/ML IJ SUSP
INTRAMUSCULAR | Status: AC
  Filled 2022-12-14: qty 1

## 2022-12-14 MED ORDER — KETOROLAC TROMETHAMINE 15 MG/ML IJ SOLN
INTRAMUSCULAR | Status: AC
  Filled 2022-12-14: qty 1

## 2022-12-14 MED ORDER — ZINC SULFATE 220 (50 ZN) MG PO CAPS
220.0000 mg | ORAL_CAPSULE | Freq: Every day | ORAL | Status: DC
  Filled 2022-12-14: qty 1

## 2022-12-14 MED ORDER — HYDROCODONE-ACETAMINOPHEN 5-325 MG PO TABS: 2.0000 | ORAL_TABLET | ORAL | Status: DC | PRN

## 2022-12-14 MED ORDER — SODIUM CHLORIDE 0.9% FLUSH: 3.0000 mL | INTRAVENOUS | Status: DC | PRN

## 2022-12-14 MED ORDER — SENNA 8.6 MG PO TABS
1.0000 | ORAL_TABLET | Freq: Two times a day (BID) | ORAL | Status: DC
  Administered 2022-12-14 – 2022-12-15 (×2): 8.6 mg via ORAL

## 2022-12-14 MED ORDER — HYDROMORPHONE HCL 1 MG/ML IJ SOLN
0.2500 mg | INTRAMUSCULAR | Status: DC | PRN
  Administered 2022-12-14 (×4): 0.5 mg via INTRAVENOUS

## 2022-12-14 MED ORDER — ACETAMINOPHEN 10 MG/ML IV SOLN
INTRAVENOUS | Status: AC
  Filled 2022-12-14: qty 100

## 2022-12-14 MED ORDER — FAMOTIDINE 20 MG PO TABS
ORAL_TABLET | ORAL | Status: AC
  Filled 2022-12-14: qty 1

## 2022-12-14 MED ORDER — PRAVASTATIN SODIUM 40 MG PO TABS
40.0000 mg | ORAL_TABLET | Freq: Every day | ORAL | Status: DC
  Administered 2022-12-14: 40 mg via ORAL

## 2022-12-14 MED ORDER — 0.9 % SODIUM CHLORIDE (POUR BTL) OPTIME
TOPICAL | Status: DC | PRN
  Administered 2022-12-14: 500 mL

## 2022-12-14 MED ORDER — PHENOL 1.4 % MT LIQD: 1.0000 | OROMUCOSAL | Status: DC | PRN

## 2022-12-14 MED ORDER — PROPOFOL 10 MG/ML IV BOLUS
INTRAVENOUS | Status: DC | PRN
  Administered 2022-12-14: 100 mg via INTRAVENOUS

## 2022-12-14 MED ORDER — PHENYLEPHRINE HCL-NACL 20-0.9 MG/250ML-% IV SOLN
INTRAVENOUS | Status: AC
  Filled 2022-12-14: qty 250

## 2022-12-14 MED ORDER — HYDROCODONE-ACETAMINOPHEN 5-325 MG PO TABS
1.0000 | ORAL_TABLET | ORAL | Status: DC | PRN
  Administered 2022-12-14: 1 via ORAL

## 2022-12-14 MED ORDER — MENTHOL 3 MG MT LOZG: 1.0000 | LOZENGE | OROMUCOSAL | Status: DC | PRN

## 2022-12-14 MED ORDER — DOCUSATE SODIUM 100 MG PO CAPS
ORAL_CAPSULE | ORAL | Status: AC
  Filled 2022-12-14: qty 1

## 2022-12-14 MED ORDER — HYDRALAZINE HCL 50 MG PO TABS
ORAL_TABLET | ORAL | Status: AC
  Filled 2022-12-14: qty 1

## 2022-12-14 MED ORDER — REMIFENTANIL HCL 1 MG IV SOLR
INTRAVENOUS | Status: DC | PRN
Start: 2022-12-14 — End: 2022-12-14
  Administered 2022-12-14: .1 ug/kg/min via INTRAVENOUS

## 2022-12-14 MED ORDER — PHENYLEPHRINE 80 MCG/ML (10ML) SYRINGE FOR IV PUSH (FOR BLOOD PRESSURE SUPPORT)
PREFILLED_SYRINGE | INTRAVENOUS | Status: AC
  Filled 2022-12-14: qty 10

## 2022-12-14 MED ORDER — DOCUSATE SODIUM 100 MG PO CAPS
100.0000 mg | ORAL_CAPSULE | Freq: Two times a day (BID) | ORAL | Status: DC
  Administered 2022-12-14: 100 mg via ORAL

## 2022-12-14 MED ORDER — CEFAZOLIN SODIUM-DEXTROSE 2-4 GM/100ML-% IV SOLN
INTRAVENOUS | Status: AC
  Filled 2022-12-14: qty 100

## 2022-12-14 MED ORDER — VITAMIN B-12 1000 MCG PO TABS: 1000.0000 ug | ORAL_TABLET | ORAL | Status: DC

## 2022-12-14 MED ORDER — HYDROMORPHONE HCL 1 MG/ML IJ SOLN
INTRAMUSCULAR | Status: AC
  Filled 2022-12-14: qty 1

## 2022-12-14 MED ORDER — STERILE WATER FOR IRRIGATION IR SOLN
Status: DC | PRN
Start: 2022-12-14 — End: 2022-12-14
  Administered 2022-12-14: 500 mL

## 2022-12-14 MED ORDER — FENTANYL CITRATE (PF) 100 MCG/2ML IJ SOLN
INTRAMUSCULAR | Status: DC | PRN
  Administered 2022-12-14: 100 ug via INTRAVENOUS

## 2022-12-14 MED ORDER — LACTATED RINGERS IV SOLN: INTRAVENOUS | Status: DC | PRN

## 2022-12-14 MED ORDER — PHENYLEPHRINE HCL-NACL 20-0.9 MG/250ML-% IV SOLN
INTRAVENOUS | Status: DC | PRN
Start: 2022-12-14 — End: 2022-12-14
  Administered 2022-12-14: 30 ug/min via INTRAVENOUS

## 2022-12-14 MED ORDER — LIDOCAINE HCL (PF) 2 % IJ SOLN
INTRAMUSCULAR | Status: AC
  Filled 2022-12-14: qty 5

## 2022-12-14 MED ORDER — IRBESARTAN 150 MG PO TABS
300.0000 mg | ORAL_TABLET | Freq: Every day | ORAL | Status: DC
  Filled 2022-12-14: qty 2

## 2022-12-14 MED ORDER — REMIFENTANIL HCL 1 MG IV SOLR
INTRAVENOUS | Status: AC
  Filled 2022-12-14: qty 1000

## 2022-12-14 MED ORDER — ACETAMINOPHEN 500 MG PO TABS
1000.0000 mg | ORAL_TABLET | Freq: Four times a day (QID) | ORAL | Status: DC
  Administered 2022-12-14 – 2022-12-15 (×2): 1000 mg via ORAL

## 2022-12-14 MED ORDER — INSULIN ASPART 100 UNIT/ML IJ SOLN: 0.0000 [IU] | Freq: Every day | INTRAMUSCULAR | Status: DC

## 2022-12-14 MED ORDER — PROPOFOL 1000 MG/100ML IV EMUL
INTRAVENOUS | Status: AC
  Filled 2022-12-14: qty 100

## 2022-12-14 MED ORDER — VITAMIN C 500 MG PO TABS
1000.0000 mg | ORAL_TABLET | Freq: Every day | ORAL | Status: DC
  Administered 2022-12-15: 1000 mg via ORAL
  Filled 2022-12-14: qty 2

## 2022-12-14 MED ORDER — METHYLPREDNISOLONE ACETATE 40 MG/ML IJ SUSP
INTRAMUSCULAR | Status: DC | PRN
  Administered 2022-12-14: 40 mg

## 2022-12-14 MED ORDER — SUCCINYLCHOLINE CHLORIDE 200 MG/10ML IV SOSY
PREFILLED_SYRINGE | INTRAVENOUS | Status: AC
  Filled 2022-12-14: qty 10

## 2022-12-14 MED ORDER — INSULIN ASPART 100 UNIT/ML IJ SOLN: 0.0000 [IU] | Freq: Three times a day (TID) | INTRAMUSCULAR | Status: DC

## 2022-12-14 MED ORDER — DOCUSATE SODIUM 100 MG PO CAPS: 100.0000 mg | ORAL_CAPSULE | Freq: Two times a day (BID) | ORAL | Status: DC

## 2022-12-14 MED ORDER — KETOROLAC TROMETHAMINE 15 MG/ML IJ SOLN
7.5000 mg | Freq: Four times a day (QID) | INTRAMUSCULAR | Status: DC
  Administered 2022-12-14 (×2): 7.5 mg via INTRAVENOUS

## 2022-12-14 MED ORDER — PROPOFOL 500 MG/50ML IV EMUL
INTRAVENOUS | Status: DC | PRN
Start: 2022-12-14 — End: 2022-12-14
  Administered 2022-12-14: 125 ug/kg/min via INTRAVENOUS

## 2022-12-14 MED ORDER — SUCCINYLCHOLINE CHLORIDE 200 MG/10ML IV SOSY
PREFILLED_SYRINGE | INTRAVENOUS | Status: DC | PRN
  Administered 2022-12-14: 100 mg via INTRAVENOUS

## 2022-12-14 MED ORDER — POLYETHYLENE GLYCOL 3350 17 G PO PACK: 17.0000 g | PACK | Freq: Every day | ORAL | Status: DC | PRN

## 2022-12-14 MED ORDER — SENNA 8.6 MG PO TABS
ORAL_TABLET | ORAL | Status: AC
  Filled 2022-12-14: qty 1

## 2022-12-14 MED ORDER — BUPIVACAINE-EPINEPHRINE (PF) 0.5% -1:200000 IJ SOLN
INTRAMUSCULAR | Status: AC
  Filled 2022-12-14: qty 10

## 2022-12-14 MED ORDER — ONDANSETRON HCL 4 MG/2ML IJ SOLN
INTRAMUSCULAR | Status: DC | PRN
  Administered 2022-12-14: 4 mg via INTRAVENOUS

## 2022-12-14 MED ORDER — DEXAMETHASONE SODIUM PHOSPHATE 10 MG/ML IJ SOLN
INTRAMUSCULAR | Status: AC
  Filled 2022-12-14: qty 1

## 2022-12-14 MED ORDER — BISACODYL 10 MG RE SUPP: 10.0000 mg | Freq: Every day | RECTAL | Status: DC | PRN

## 2022-12-14 MED ORDER — PROPOFOL 10 MG/ML IV BOLUS
INTRAVENOUS | Status: AC
  Filled 2022-12-14: qty 20

## 2022-12-14 MED ORDER — PRAVASTATIN SODIUM 40 MG PO TABS
ORAL_TABLET | ORAL | Status: AC
  Filled 2022-12-14: qty 1

## 2022-12-14 MED ORDER — ENOXAPARIN SODIUM 40 MG/0.4ML IJ SOSY
40.0000 mg | PREFILLED_SYRINGE | INTRAMUSCULAR | Status: DC
  Administered 2022-12-15: 40 mg via SUBCUTANEOUS

## 2022-12-14 MED ORDER — ONDANSETRON HCL 4 MG/2ML IJ SOLN: 4.0000 mg | Freq: Four times a day (QID) | INTRAMUSCULAR | Status: DC | PRN

## 2022-12-14 MED ORDER — FENTANYL CITRATE (PF) 100 MCG/2ML IJ SOLN
INTRAMUSCULAR | Status: AC
  Filled 2022-12-14: qty 2

## 2022-12-14 MED ORDER — ONDANSETRON HCL 4 MG/2ML IJ SOLN
INTRAMUSCULAR | Status: AC
  Filled 2022-12-14: qty 2

## 2022-12-14 MED ORDER — DEXAMETHASONE SODIUM PHOSPHATE 10 MG/ML IJ SOLN
INTRAMUSCULAR | Status: DC | PRN
  Administered 2022-12-14: 10 mg via INTRAVENOUS

## 2022-12-14 MED ORDER — BUPIVACAINE LIPOSOME 1.3 % IJ SUSP
INTRAMUSCULAR | Status: AC
  Filled 2022-12-14: qty 20

## 2022-12-14 MED ORDER — BUPIVACAINE HCL (PF) 0.5 % IJ SOLN
INTRAMUSCULAR | Status: AC
  Filled 2022-12-14: qty 30

## 2022-12-14 MED ORDER — METHOCARBAMOL 500 MG PO TABS
500.0000 mg | ORAL_TABLET | Freq: Four times a day (QID) | ORAL | Status: DC | PRN
  Administered 2022-12-15: 500 mg via ORAL

## 2022-12-14 MED ORDER — SURGIFLO WITH THROMBIN (HEMOSTATIC MATRIX KIT) OPTIME
TOPICAL | Status: DC | PRN
  Administered 2022-12-14: 1 via TOPICAL

## 2022-12-14 MED ORDER — ACETAMINOPHEN 10 MG/ML IV SOLN
INTRAVENOUS | Status: DC | PRN
  Administered 2022-12-14: 1000 mg via INTRAVENOUS

## 2022-12-14 MED ORDER — LIDOCAINE HCL (CARDIAC) PF 100 MG/5ML IV SOSY
PREFILLED_SYRINGE | INTRAVENOUS | Status: DC | PRN
  Administered 2022-12-14: 60 mg via INTRAVENOUS

## 2022-12-14 MED ORDER — DIPHENHYDRAMINE HCL 50 MG/ML IJ SOLN
INTRAMUSCULAR | Status: DC | PRN
Start: 2022-12-14 — End: 2022-12-14
  Administered 2022-12-14: 12.5 mg via INTRAVENOUS

## 2022-12-14 MED ORDER — CHLORHEXIDINE GLUCONATE 0.12 % MT SOLN
OROMUCOSAL | Status: AC
  Filled 2022-12-14: qty 15

## 2022-12-14 MED ORDER — HYDRALAZINE HCL 50 MG PO TABS
50.0000 mg | ORAL_TABLET | Freq: Two times a day (BID) | ORAL | Status: DC
  Administered 2022-12-14 – 2022-12-15 (×2): 50 mg via ORAL

## 2022-12-14 MED ORDER — INSULIN ASPART 100 UNIT/ML IJ SOLN
INTRAMUSCULAR | Status: AC
  Filled 2022-12-14: qty 1

## 2022-12-14 MED ORDER — MAGNESIUM CITRATE PO SOLN: 1.0000 | Freq: Once | ORAL | Status: DC | PRN

## 2022-12-14 MED ORDER — METHOCARBAMOL 1000 MG/10ML IJ SOLN
500.0000 mg | Freq: Four times a day (QID) | INTRAVENOUS | Status: DC | PRN
  Administered 2022-12-14: 500 mg via INTRAVENOUS
  Filled 2022-12-14: qty 500

## 2022-12-14 MED ORDER — ONDANSETRON HCL 4 MG PO TABS: 4.0000 mg | ORAL_TABLET | Freq: Four times a day (QID) | ORAL | Status: DC | PRN

## 2022-12-14 MED ORDER — HYDROMORPHONE HCL 1 MG/ML IJ SOLN
INTRAMUSCULAR | Status: DC | PRN
Start: 2022-12-14 — End: 2022-12-14
  Administered 2022-12-14: .25 mg via INTRAVENOUS

## 2022-12-14 MED ORDER — ACETAMINOPHEN 500 MG PO TABS
ORAL_TABLET | ORAL | Status: AC
  Filled 2022-12-14: qty 2

## 2022-12-14 SURGICAL SUPPLY — 45 items
ADH SKN CLS APL DERMABOND .7 (GAUZE/BANDAGES/DRESSINGS) ×1
AGENT HMST KT MTR STRL THRMB (HEMOSTASIS) ×1
BASIN KIT SINGLE STR (MISCELLANEOUS) ×1 IMPLANT
BUR NEURO DRILL SOFT 3.0X3.8M (BURR) ×1 IMPLANT
CNTNR URN SCR LID CUP LEK RST (MISCELLANEOUS) ×1 IMPLANT
CONT SPEC 4OZ STRL OR WHT (MISCELLANEOUS)
DERMABOND ADVANCED .7 DNX12 (GAUZE/BANDAGES/DRESSINGS) ×1 IMPLANT
DRAPE C ARM PK CFD 31 SPINE (DRAPES) ×1 IMPLANT
DRAPE LAPAROTOMY 100X77 ABD (DRAPES) ×1 IMPLANT
DRAPE MICROSCOPE SPINE 48X150 (DRAPES) IMPLANT
DRSG OPSITE POSTOP 4X6 (GAUZE/BANDAGES/DRESSINGS) IMPLANT
DRSG TEGADERM 4X4.75 (GAUZE/BANDAGES/DRESSINGS) IMPLANT
ELECT EZSTD 165MM 6.5IN (MISCELLANEOUS) ×1
ELECT REM PT RETURN 9FT ADLT (ELECTROSURGICAL) ×1
ELECTRODE EZSTD 165MM 6.5IN (MISCELLANEOUS) ×1 IMPLANT
ELECTRODE REM PT RTRN 9FT ADLT (ELECTROSURGICAL) ×1 IMPLANT
EVACUATOR 1/8 PVC DRAIN (DRAIN) IMPLANT
GLOVE BIOGEL PI IND STRL 6.5 (GLOVE) ×1 IMPLANT
GLOVE SURG SYN 6.5 ES PF (GLOVE) ×1 IMPLANT
GLOVE SURG SYN 6.5 PF PI (GLOVE) ×1 IMPLANT
GLOVE SURG SYN 8.5 E (GLOVE) ×3 IMPLANT
GLOVE SURG SYN 8.5 PF PI (GLOVE) ×3 IMPLANT
GOWN SRG LRG LVL 4 IMPRV REINF (GOWNS) ×1 IMPLANT
GOWN SRG XL LVL 3 NONREINFORCE (GOWNS) ×1 IMPLANT
GOWN STRL NON-REIN TWL XL LVL3 (GOWNS) ×1
GOWN STRL REIN LRG LVL4 (GOWNS) ×1
KIT SPINAL PRONEVIEW (KITS) ×1 IMPLANT
MANIFOLD NEPTUNE II (INSTRUMENTS) ×1 IMPLANT
MARKER SKIN DUAL TIP RULER LAB (MISCELLANEOUS) ×1 IMPLANT
NDL SAFETY ECLIPSE 18X1.5 (NEEDLE) ×1 IMPLANT
NS IRRIG 1000ML POUR BTL (IV SOLUTION) ×1 IMPLANT
NS IRRIG 500ML POUR BTL (IV SOLUTION) IMPLANT
PACK LAMINECTOMY ARMC (PACKS) ×1 IMPLANT
SURGIFLO W/THROMBIN 8M KIT (HEMOSTASIS) ×1 IMPLANT
SUT DVC VLOC 3-0 CL 6 P-12 (SUTURE) ×1 IMPLANT
SUT ETHILON 3-0 FS-10 30 BLK (SUTURE) ×1
SUT VIC AB 0 CT1 27 (SUTURE) ×1
SUT VIC AB 0 CT1 27XCR 8 STRN (SUTURE) ×1 IMPLANT
SUT VIC AB 2-0 CT1 18 (SUTURE) ×1 IMPLANT
SUTURE EHLN 3-0 FS-10 30 BLK (SUTURE) IMPLANT
SYR 30ML LL (SYRINGE) ×2 IMPLANT
SYR 3ML LL SCALE MARK (SYRINGE) ×1 IMPLANT
TRAP FLUID SMOKE EVACUATOR (MISCELLANEOUS) ×1 IMPLANT
WATER STERILE IRR 1000ML POUR (IV SOLUTION) ×2 IMPLANT
WATER STERILE IRR 500ML POUR (IV SOLUTION) IMPLANT

## 2022-12-14 NOTE — Evaluation (Signed)
Physical Therapy Evaluation Patient Details Name: Paige Medina MRN: [id number] DOB: August 26, 1933 Today's Date: 12/14/2022  History of Present Illness  Pt is 89 y.o. s/p L3-L5 posterior spinal decompression and L L5-S1 microdiscetomy.  Pt has PMH including: hx of breast cancer, CKD, DM2, heart murmur, HTN, and PVC's.   Clinical Impression  Pt received in Semi-Fowler's position and agreeable to therapy.  Pt's daughter and husband in room upon arrival as well and assisted with history gathering.  Pt able to perform bed mobility while abiding by the precautions and then transfer to standing position.  Pt then assisted with gait belt drain management before ambulating.  Pt then able to ambulate up/down stairs with L hand utilizing rail and R utilized HHA to mimic a cane as she does at home normally.  Pt able to perform without any complications and then continued to ambulate back to room where she returned to bed and was left with all needs met and nursing within the room.  MD notified of performance.      If plan is discharge home, recommend the following: A little help with walking and/or transfers;A little help with bathing/dressing/bathroom   Can travel by private vehicle        Equipment Recommendations None recommended by PT  Recommendations for Other Services       Functional Status Assessment Patient has had a recent decline in their functional status and demonstrates the ability to make significant improvements in function in a reasonable and predictable amount of time.     Precautions / Restrictions Precautions Precautions: Back Precaution Booklet Issued: Yes (comment) Restrictions Weight Bearing Restrictions: No      Mobility  Bed Mobility Overal bed mobility: Modified Independent             General bed mobility comments: extra time allowed for pt to perform.    Transfers Overall transfer level: Needs assistance Equipment used: Standard walker Transfers: Sit  to/from Stand Sit to Stand: Contact guard assist                Ambulation/Gait Ambulation/Gait assistance: Contact guard assist Gait Distance (Feet): 200 Feet Assistive device: Rolling walker (2 wheels) Gait Pattern/deviations: WFL(Within Functional Limits) Gait velocity: decreased     General Gait Details: pt prefers walker to be higher, but has good posture when ambulating, so therapist kept walker at elevated height.  Stairs            Wheelchair Mobility     Tilt Bed    Modified Rankin (Stroke Patients Only)       Balance Overall balance assessment: Needs assistance Sitting-balance support: No upper extremity supported, Feet supported Sitting balance-Leahy Scale: Good     Standing balance support: No upper extremity supported, Bilateral upper extremity supported, During functional activity, Reliant on assistive device for balance Standing balance-Leahy Scale: Good                               Pertinent Vitals/Pain Pain Assessment Pain Assessment: 0-10 Pain Score: 7  Pain Location: L Hip Pain Descriptors / Indicators: Dull, Burning Pain Intervention(s): Limited activity within patient's tolerance, Monitored during session, RN gave pain meds during session    Home Living Family/patient expects to be discharged to:: Private residence Living Arrangements: Spouse/significant other Available Help at Discharge: Family;Available 24 hours/day Type of Home: House Home Access: Stairs to enter;Ramped entrance Entrance Stairs-Rails: Left Entrance Stairs-Number of Steps: 2  Home Layout: One level Home Equipment: Agricultural consultant (2 wheels);Grab bars - tub/shower      Prior Function Prior Level of Function : Independent/Modified Independent                     Extremity/Trunk Assessment   Upper Extremity Assessment Upper Extremity Assessment: Overall WFL for tasks assessed    Lower Extremity Assessment Lower Extremity  Assessment: Overall WFL for tasks assessed       Communication   Communication Communication: No apparent difficulties  Cognition Arousal: Alert                                              General Comments      Exercises     Assessment/Plan    PT Assessment Patient does not need any further PT services  PT Problem List         PT Treatment Interventions DME instruction;Gait training;Functional mobility training;Therapeutic activities;Therapeutic exercise;Balance training;Neuromuscular re-education    PT Goals (Current goals can be found in the Care Plan section)  Acute Rehab PT Goals Patient Stated Goal: to go home. PT Goal Formulation: With patient Time For Goal Achievement: 12/28/22 Potential to Achieve Goals: Good    Frequency Min 1X/week     Co-evaluation               AM-PAC PT "6 Clicks" Mobility  Outcome Measure Help needed turning from your back to your side while in a flat bed without using bedrails?: A Little Help needed moving from lying on your back to sitting on the side of a flat bed without using bedrails?: A Little Help needed moving to and from a bed to a chair (including a wheelchair)?: A Little Help needed standing up from a chair using your arms (e.g., wheelchair or bedside chair)?: A Little Help needed to walk in hospital room?: A Little Help needed climbing 3-5 steps with a railing? : A Little 6 Click Score: 18    End of Session Equipment Utilized During Treatment: Gait belt Activity Tolerance: Patient tolerated treatment well Patient left: in bed;with call bell/phone within reach;with nursing/sitter in room Nurse Communication: Mobility status PT Visit Diagnosis: Other abnormalities of gait and mobility (R26.89);Muscle weakness (generalized) (M62.81)    Time: 1610-9604 PT Time Calculation (min) (ACUTE ONLY): 23 min   Charges:   PT Evaluation $PT Eval Low Complexity: 1 Low PT Treatments $Therapeutic  Activity: 8-22 mins PT General Charges $$ ACUTE PT VISIT: 1 Visit         Nolon Bussing, PT, DPT Physical Therapist - [facility]  12/14/22, 6:03 PM
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
## 2022-12-14 NOTE — Op Note (Signed)
Indications: Paige Medina is suffering from lumbar radiculopathy. The patient tried and failed conservative management, prompting surgical intervention.  Findings: stenosis, disc herniation at L5/S1  Preoperative Diagnosis: M48.062 - Spinal stenosis, lumbar region, with neurogenic claudication , M54.16 - Lumbar radiculopathy  Postoperative Diagnosis: same   EBL: 25 ml IVF: see anesthesia record Drains: one placed Disposition: Extubated and Stable to PACU Complications: none  No foley catheter was placed.   Preoperative Note:   Risks of surgery discussed include: infection, bleeding, stroke, coma, death, paralysis, CSF leak, nerve/spinal cord injury, numbness, tingling, weakness, complex regional pain syndrome, recurrent stenosis and/or disc herniation, vascular injury, development of instability, neck/back pain, need for further surgery, persistent symptoms, development of deformity, and the risks of anesthesia. The patient understood these risks and agreed to proceed.  Operative Note:   1) Left L5/S1 microdiscectomy 2) L3-5 decompression  The patient was then brought from the preoperative center with intravenous access established.  The patient underwent general anesthesia and endotracheal tube intubation, and was then rotated on the [place] rail top where all pressure points were appropriately padded.  The skin was then thoroughly cleansed.  Perioperative antibiotic prophylaxis was administered.  Sterile prep and drapes were then applied and a timeout was then observed.  C-arm was brought into the field under sterile conditions, and the L5-S1 disc space identified and marked with an incision on the left 1cm lateral to midline.  Once this was complete a 6 cm incision was opened with the use of a #10 blade knife.  The Metrx tubes were sequentially advanced under lateral fluoroscopy until a 18 x 40 mm Metrx tube was placed over the facet and lamina and secured to the bed.    The  microscope was then sterilely brought into the field and muscle creep was hemostased with a bipolar and resected with a pituitary rongeur.  A Bovie extender was then used to expose the spinous process and lamina.  Careful attention was placed to not violate the facet capsule. A 3 mm matchstick drill bit was then used to make a hemi-laminotomy trough until the ligamentum flavum was exposed.  This was extended to the base of the spinous process.  Once this was complete and the underlying ligamentum flavum was visualized, the ligamentum was dissected with an up angle curette and resected with a #2 and #3 mm biting Kerrison.  The laminotomy opening was also expanded in similar fashion and hemostasis was obtained with Surgifoam and a patty as well as bone wax.  The rostral aspect of the caudal level of the lamina was also resected with a #2 biting Kerrison effort to further enhance exposure.  Once the underlying dura was visualized a Penfield 4 was then used to dissect and expose the traversing nerve root.  Once this was identified a nerve root retractor suction was used to mobilize this medially.  The venous plexus was hemostased with Surgifoam and light bipolar use.  A small penfied was then used to make a small annulotomy within the disc space and disc space contents were noted to come through the annulus.    The disc herniation was identified and dissected free using a balltip probe. The pituitary rongeur was used to remove the extruded disc fragments. Once the thecal sac and nerve root were noted to be relaxed and under less tension the ball-tipped feeler was passed along the foramen distally to ensure no residual compression was noted.    Depo-Medrol was placed along the nerve root.  The area was irrigated. The tube system was then removed under microscopic visualization and hemostasis was obtained with a bipolar.    After performing the decompression at L5-S1, the metrx tubes were sequentially advanced and  confirmed in position at L4-5. An 18mm by 40mm tube was locked in place to the bed side attachment.  Fluoroscopy was then removed from the field.  The microscope was then sterilely brought into the field and muscle creep was hemostased with a bipolar and resected with a pituitary rongeur.  A Bovie extender was then used to expose the spinous process and lamina.  Careful attention was placed to not violate the facet capsule. A 3 mm matchstick drill bit was then used to make a hemi-laminotomy trough until the ligamentum flavum was exposed.  This was extended to the base of the spinous process and to the contralateral side to remove all the central bone from each side.  Once this was complete and the underlying ligamentum flavum was visualized, it was dissected with a curette and resected with Kerrison rongeurs.  Extensive ligamentum hypertrophy was noted, requiring a substantial amount of time and care for removal.  The dura was identified and palpated. The kerrison rongeur was then used to remove the medial facet bilaterally until no compression was noted.  A balltip probe was used to confirm decompression of the ipsilateral L5 nerve root.  Additional attention was paid to completion of the contralateral foraminotomy until the contralateral L5 nerve root was completely free.  Once this was complete, L4-5 central decompression including medial facetectomy and foraminotomy was confirmed and decompression on both sides was confirmed. No CSF leak was noted.  Depo-Medrol was placed on the nerve root.   The wound was copiously irrigated. The tube system was then removed under microscopic visualization and hemostasis was obtained with a bipolar.    After performing the decompression at L4-5, the metrx tubes were sequentially advanced and confirmed in position at L3-4. An 18mm by 40mm tube was locked in place to the bed side attachment.  Fluoroscopy was then removed from the field.  The microscope was then sterilely  brought into the field and muscle creep was hemostased with a bipolar and resected with a pituitary rongeur.  A Bovie extender was then used to expose the spinous process and lamina.  Careful attention was placed to not violate the facet capsule. A 3 mm matchstick drill bit was then used to make a hemi-laminotomy trough until the ligamentum flavum was exposed.  This was extended to the base of the spinous process and to the contralateral side to remove all the central bone from each side.  Once this was complete and the underlying ligamentum flavum was visualized, it was dissected with a curette and resected with Kerrison rongeurs.  Extensive ligamentum hypertrophy was noted, requiring a substantial amount of time and care for removal.  The dura was identified and palpated. The kerrison rongeur was then used to remove the medial facet bilaterally until no compression was noted.  A balltip probe was used to confirm decompression of the ipsilateral L4 nerve root.  Additional attention was paid to completion of the contralateral foraminotomy until the contralateral L4 nerve root was completely free.  Once this was complete, L3-4 central decompression including medial facetectomy and foraminotomy was confirmed and decompression on both sides was confirmed. No CSF leak was noted.  Depo-Medrol was placed on the nerve root.   The wound was copiously irrigated. The tube system was then removed under microscopic  visualization and hemostasis was obtained with a bipolar.    A drain was placed.   The fascial layer was reapproximated with the use of a 0- Vicryl suture.  Subcutaneous tissue layer was reapproximated using 2-0 Vicryl suture.  3-0 monocryl was used on the skin. The skin was then cleansed and Dermabond was used to close the skin opening.  Patient was then rotated back to the preoperative bed awakened from anesthesia and taken to recovery all counts are correct in this case.   I performed the entire  procedure with the assistance of Manning Charity PA as an Designer, television/film set. An assistant was required for this procedure due to the complexity.  The assistant provided assistance in tissue manipulation and suction, and was required for the successful and safe performance of the procedure. I performed the critical portions of the procedure.   Venetia Night MD

## 2022-12-14 NOTE — Transfer of Care (Signed)
Immediate Anesthesia Transfer of Care Note  Patient: Paige Medina  Procedure(s) Performed: L3-5 POSTERIOR SPINAL DECOMPRESSION, LEFT L5-S1 MICRODISCECTOMY (Left: Back)  Patient Location: PACU  Anesthesia Type:General  Level of Consciousness: drowsy  Airway & Oxygen Therapy: Patient Spontanous Breathing and Patient connected to face mask oxygen  Post-op Assessment: Report given to RN and Post -op Vital signs reviewed and unstable, Anesthesiologist notified  Post vital signs: stable  Last Vitals:  Vitals Value Taken Time  BP 186/91 12/14/22 1202  Temp    Pulse 65 12/14/22 1212  Resp 11 12/14/22 1212  SpO2 100 % 12/14/22 1212  Vitals shown include unfiled device data.  Last Pain:  Vitals:   12/14/22 0822  TempSrc: Temporal  PainSc: 5          Complications: No notable events documented.

## 2022-12-14 NOTE — Discharge Instructions (Signed)
Your surgeon has performed an operation on your lumbar spine (low back) to relieve pressure on one or more nerves. Many times, patients feel better immediately after surgery and can "overdo it." Even if you feel well, it is important that you follow these activity guidelines. If you do not let your back heal properly from the surgery, you can increase the chance of a disc herniation and/or return of your symptoms. The following are instructions to help in your recovery once you have been discharged from the hospital.  * It is ok to take NSAIDs after surgery.  Activity    No bending, lifting, or twisting ("BLT"). Avoid lifting objects heavier than 10 pounds (gallon milk jug).  Where possible, avoid household activities that involve lifting, bending, pushing, or pulling such as laundry, vacuuming, grocery shopping, and childcare. Try to arrange for help from friends and family for these activities while your back heals.  Increase physical activity slowly as tolerated.  Taking short walks is encouraged, but avoid strenuous exercise. Do not jog, run, bicycle, lift weights, or participate in any other exercises unless specifically allowed by your doctor. Avoid prolonged sitting, including car rides.  Talk to your doctor before resuming sexual activity.  You should not drive until cleared by your doctor.  Until released by your doctor, you should not return to work or school.  You should rest at home and let your body heal.   You may shower three days after your surgery.  After showering, lightly dab your incision dry. Do not take a tub bath or go swimming for 3 weeks, or until approved by your doctor at your follow-up appointment.  If you smoke, we strongly recommend that you quit.  Smoking has been proven to interfere with normal healing in your back and will dramatically reduce the success rate of your surgery. Please contact [facility] (800-QUIT-NOW) and use the resources at www.[facility].com for  assistance in stopping smoking.  Surgical Incision   If you have a dressing on your incision, you may remove it three days after your surgery. Keep your incision area clean and dry.  If you have staples or stitches on your incision, you should have a follow up scheduled for removal. If you do not have staples or stitches, you will have steri-strips (small pieces of surgical tape) or Dermabond glue. The steri-strips/glue should begin to peel away within about a week (it is fine if the steri-strips fall off before then). If the strips are still in place one week after your surgery, you may gently remove them.  Diet            You may return to your usual diet. Be sure to stay hydrated.  When to Contact us  Although your surgery and recovery will likely be uneventful, you may have some residual numbness, aches, and pains in your back and/or legs. This is normal and should improve in the next few weeks.  However, should you experience any of the following, contact us immediately: New numbness or weakness Pain that is progressively getting worse, and is not relieved by your pain medications or rest Bleeding, redness, swelling, pain, or drainage from surgical incision Chills or flu-like symptoms Fever greater than 101.0 F (38.3 C) Problems with bowel or bladder functions Difficulty breathing or shortness of breath Warmth, tenderness, or swelling in your calf  Contact Information How to contact us:  If you have any questions/concerns before or after surgery, you can reach [place] at [phone], or you can  send a [employer]. We can be reached by phone or mychart 8am-4pm, Monday-Friday.  *Please note: Calls after 4pm are forwarded to a third party answering service. Mychart messages are not routinely monitored during evenings, weekends, and holidays. Please call our office to contact the answering service for urgent concerns during non-business hours.

## 2022-12-14 NOTE — Anesthesia Procedure Notes (Addendum)
Procedure Name: Intubation Date/Time: 12/14/2022 9:42 AM  Performed by: Rodney Booze, CRNAPre-anesthesia Checklist: Patient identified, Emergency Drugs available, Suction available and Patient being monitored Patient Re-evaluated:Patient Re-evaluated prior to induction Oxygen Delivery Method: Circle system utilized Preoxygenation: Pre-oxygenation with 100% oxygen Induction Type: IV induction Ventilation: Mask ventilation without difficulty Laryngoscope Size: Miller and 2 Grade View: Grade II Tube type: Oral Tube size: 7.0 mm Number of attempts: 1 Airway Equipment and Method: Stylet and Oral airway Placement Confirmation: ETT inserted through vocal cords under direct vision, positive ETCO2 and breath sounds checked- equal and bilateral Secured at: 21 cm Tube secured with: Tape Dental Injury: Teeth and Oropharynx as per pre-operative assessment

## 2022-12-15 ENCOUNTER — Encounter: Payer: Self-pay | Admitting: Neurosurgery

## 2022-12-15 DIAGNOSIS — M48062 Spinal stenosis, lumbar region with neurogenic claudication: Secondary | ICD-10-CM | POA: Diagnosis not present

## 2022-12-15 LAB — GLUCOSE, CAPILLARY: Glucose-Capillary: 97 mg/dL (ref 70–99)

## 2022-12-15 MED ORDER — ACETAMINOPHEN 500 MG PO TABS
ORAL_TABLET | ORAL | Status: AC
  Filled 2022-12-15: qty 2

## 2022-12-15 MED ORDER — HYDRALAZINE HCL 50 MG PO TABS
ORAL_TABLET | ORAL | Status: AC
  Filled 2022-12-15: qty 1

## 2022-12-15 MED ORDER — SENNA 8.6 MG PO TABS
ORAL_TABLET | ORAL | Status: AC
  Filled 2022-12-15: qty 1

## 2022-12-15 MED ORDER — DOCUSATE SODIUM 100 MG PO CAPS
ORAL_CAPSULE | ORAL | Status: AC
  Filled 2022-12-15: qty 1

## 2022-12-15 MED ORDER — METHOCARBAMOL 500 MG PO TABS: 500.0000 mg | ORAL_TABLET | Freq: Four times a day (QID) | ORAL | 0 refills | Status: DC | PRN

## 2022-12-15 MED ORDER — ENOXAPARIN SODIUM 40 MG/0.4ML IJ SOSY
PREFILLED_SYRINGE | INTRAMUSCULAR | Status: AC
  Filled 2022-12-15: qty 0.4

## 2022-12-15 MED ORDER — METHOCARBAMOL 500 MG PO TABS
ORAL_TABLET | ORAL | Status: AC
  Filled 2022-12-15: qty 1

## 2022-12-15 MED ORDER — KETOROLAC TROMETHAMINE 15 MG/ML IJ SOLN
INTRAMUSCULAR | Status: AC
  Filled 2022-12-15: qty 1

## 2022-12-15 MED ORDER — HYDROCODONE-ACETAMINOPHEN 5-325 MG PO TABS: 1.0000 | ORAL_TABLET | ORAL | 0 refills | Status: DC | PRN

## 2022-12-15 NOTE — Anesthesia Postprocedure Evaluation (Signed)
Anesthesia Post Note  Patient: RHYEN MAZARIEGO  Procedure(s) Performed: L3-5 POSTERIOR SPINAL DECOMPRESSION, LEFT L5-S1 MICRODISCECTOMY (Left: Back)  Patient location during evaluation: PACU Anesthesia Type: General Level of consciousness: awake and alert Pain management: pain level controlled Vital Signs Assessment: post-procedure vital signs reviewed and stable Respiratory status: spontaneous breathing, nonlabored ventilation, respiratory function stable and patient connected to nasal cannula oxygen Cardiovascular status: blood pressure returned to baseline and stable Postop Assessment: no apparent nausea or vomiting Anesthetic complications: no   No notable events documented.   Last Vitals:  Vitals:   12/15/22 0232 12/15/22 0632  BP: (!) 180/69 (!) 178/64  Pulse: 68 74  Resp: 18 16  Temp:  36.7 C  SpO2: 97% 98%    Last Pain:  Vitals:   12/15/22 5638  TempSrc: Temporal  PainSc:                  Louie Boston

## 2022-12-15 NOTE — Plan of Care (Signed)
  Problem: Fluid Volume: Goal: Ability to maintain a balanced intake and output will improve Outcome: Progressing   Problem: Nutritional: Goal: Maintenance of adequate nutrition will improve Outcome: Progressing   Problem: Tissue Perfusion: Goal: Adequacy of tissue perfusion will improve Outcome: Progressing

## 2022-12-15 NOTE — Plan of Care (Signed)
Problem: Activity: Goal: Ability to avoid complications of mobility impairment will improve Outcome: Progressing   Problem: Pain Management: Goal: Pain level will decrease Outcome: Progressing

## 2022-12-15 NOTE — Progress Notes (Signed)
   Neurosurgery Progress Note  History: Paige Medina is s/p left L5-S1 microdiscectomy. L3-5 decompression   POD1: Pt doing well overnight with minimal concerns.   Physical Exam: Vitals:   12/15/22 0632 12/15/22 0750  BP: (!) 178/64 (!) 143/56  Pulse: 74 (!) 53  Resp: 16 16  Temp: 98 F (36.7 C) 98.3 F (36.8 C)  SpO2: 98% 98%    AA Ox3 CNI  Strength:5/5 throughout BLE Hv 30 since surgery Data:  Other tests/results: none  Assessment/Plan:  Paige Medina is an 89 y.o presenting with back and bilateral leg pain s/p left L5-S1 microdisectomy and L3-5 decompression.  - mobilize - pain control - DVT prophylaxis - dispo planning underway  Manning Charity PA-C Department of Neurosurgery

## 2022-12-15 NOTE — Progress Notes (Signed)
DISCHARGE NOTE:  Pt given discharge instructions and verbalized understanding. Pt wheeled to car by staff, family providing transportation.

## 2022-12-15 NOTE — Discharge Summary (Signed)
Discharge Summary  Patient ID: Paige Medina MRN: [id number] DOB/AGE: [birth year] 89 y.o.  Admit date: 12/14/2022 Discharge date: 12/15/2022  Admission Diagnoses: M48.062 - Spinal stenosis, lumbar region, with neurogenic claudication , M54.16 - Lumbar radiculopathy   Discharge Diagnoses:  Principal Problem:   Lumbar stenosis Active Problems:   Neurogenic claudication due to lumbar spinal stenosis   Lumbar radiculopathy   Discharged Condition: good  Hospital Course:  Paige Medina is an 89 y.o presenting with symptomatic lumbar stenosis s/p left L5-S1 microdiscectomy. L3-5 decompression. Her intraoperative course was uncomplicated. She was admitted overnight for pain control, therapy evaluation, and drain output monitoring. Her drain output was minimal and removed on the morning of POD1. She was seen and evaluated by therapy and deemed appropriate for discharge home. Her leg and hip pain improved post-op and her surgical pain was well controlled on oral medications. She was discharged home on Norco and Robaxin. She refused prescription for Senna.  Consults: None  Significant Diagnostic Studies: none   Treatments: surgery: as above. Please see separately dictated operative report for further details  Discharge Exam: Blood pressure (!) 143/56, pulse (!) 53, temperature 98.3 F (36.8 C), temperature source Temporal, resp. rate 16, SpO2 98%. CN II-XII grossly intact 5/5 throughout BLE Incision covered with clean post-op dressing  Disposition: Discharge disposition: 01-Home or Self Care       Discharge Instructions     Incentive spirometry RT   Complete by: As directed       Allergies as of 12/15/2022       Reactions   Lidocaine Itching, Swelling, Other (See Comments)   Blisters, tetracaine okay for epidural injections   Lipitor [atorvastatin] Other (See Comments)   myalgias   Amlodipine Other (See Comments)   Constipation, markedly low blood pressures   Latex  Other (See Comments)   Blisters   Ciprofloxacin Nausea Only   Oxycodone Nausea Only, Other (See Comments)   constipation   Tape Other (See Comments)   Blisters--(Paper or cloth tape okay)   Tegaderm Ag Mesh [silver] Rash   Tramadol Nausea Only, Other (See Comments)   constipation        Medication List     STOP taking these medications    b complex vitamins capsule       TAKE these medications    beta carotene w/minerals tablet Take 1 tablet by mouth daily.   Calcium Carb-Cholecalciferol 600-20 MG-MCG Tabs Commonly known as: Caltrate 600+D3 Take 1 tablet by mouth daily.   clobetasol 0.05 % external solution Commonly known as: TEMOVATE APPLY SMALL AMOUNT TOPICALLY TO THE AFFECTED AREA ONCE DAILY UP TO 5 DAYS WEEKLY What changed:  how much to take how to take this when to take this reasons to take this   Co Q-10 200 MG Caps Take 400 mg by mouth every morning.   cyanocobalamin 1000 MCG tablet Commonly known as: VITAMIN B12 Take 1,000 mcg by mouth every other day.   docusate sodium 100 MG capsule Commonly known as: COLACE Take 100-200 mg by mouth 2 (two) times daily. 100 mg am, 200 mg hs   doxycycline 100 MG tablet Commonly known as: VIBRA-TABS Take 1 tablet (100 mg total) by mouth daily as needed (rosacea flare).   hydrALAZINE 50 MG tablet Commonly known as: APRESOLINE Take 1 tablet (50 mg total) by mouth in the morning and at bedtime.   HYDROcodone-acetaminophen 5-325 MG tablet Commonly known as: NORCO/VICODIN Take 1 tablet by mouth every 4 (four) hours as needed  for moderate pain (pain score 4-6) ((score 4 to 6)).   lovastatin 40 MG tablet Commonly known as: MEVACOR Take 1 tablet (40 mg total) by mouth daily. What changed: when to take this   methocarbamol 500 MG tablet Commonly known as: ROBAXIN Take 1 tablet (500 mg total) by mouth every 6 (six) hours as needed for muscle spasms.   metroNIDAZOLE 0.75 % gel Commonly known as: METROGEL APPLY  SMALL AMOUNT TOPICALLY TO THE AFFECTED AREA 1 TO 2 TIMES DAILY AS DIRECTED   olmesartan 40 MG tablet Commonly known as: BENICAR TAKE 1 TABLET(40 MG) BY MOUTH DAILY What changed: See the new instructions.   ONE TOUCH ULTRA 2 w/Device Kit Use as directed   OneTouch Delica Lancets 33G Misc Use as instructed to check blood sugar once daily.   OneTouch Ultra test strip Generic drug: glucose blood Use as instructed to check blood sugar once daily.   OSTEO BI-FLEX ONE PER DAY PO Take 1 tablet by mouth 2 (two) times daily.   PROBIOTIC DAILY PO Take 1 capsule by mouth daily.   triamcinolone cream 0.1 % Commonly known as: KENALOG APPLY TOPICALLY TO THE AFFECTED AREA TWICE DAILY AS NEEDED   vitamin C 1000 MG tablet Take 1,000 mg by mouth daily.   Vitamin D3 25 MCG (1000 UT) Caps Take 1 capsule (1,000 Units total) by mouth daily.   Zinc 30 MG Tabs Take 1 tablet by mouth daily.        Follow-up Information     Susanne Borders, PA Follow up on 12/29/2022.   Specialty: Neurosurgery Contact information: [address] [place] [address] [phone]                 Signed: Susanne Borders 12/15/2022, 8:44 AM

## 2022-12-17 ENCOUNTER — Telehealth: Payer: Self-pay

## 2022-12-17 NOTE — Telephone Encounter (Signed)
Paige Medina called in to report that her last bowel movement was Monday She has tried miralax, a suppository, and a stool softener. She is passing gas.   I advised her to try a bottle of magnesium citrate.  We discussed red flag symptoms that warrant emergent evaluation such hard/rigid abdomen, severe abdominal pain, vomiting, inability to pass gas.

## 2022-12-24 ENCOUNTER — Encounter: Payer: Self-pay | Admitting: Neurosurgery

## 2022-12-24 DIAGNOSIS — C50212 Malignant neoplasm of upper-inner quadrant of left female breast: Secondary | ICD-10-CM | POA: Diagnosis not present

## 2022-12-29 ENCOUNTER — Encounter: Payer: PPO | Admitting: Neurosurgery

## 2022-12-31 ENCOUNTER — Ambulatory Visit: Payer: PPO | Admitting: Neurosurgery

## 2022-12-31 ENCOUNTER — Encounter: Payer: Self-pay | Admitting: Neurosurgery

## 2022-12-31 VITALS — BP 138/76 | Temp 97.6°F | Ht 64.0 in | Wt 138.0 lb

## 2022-12-31 DIAGNOSIS — M5416 Radiculopathy, lumbar region: Secondary | ICD-10-CM | POA: Diagnosis not present

## 2022-12-31 DIAGNOSIS — Z09 Encounter for follow-up examination after completed treatment for conditions other than malignant neoplasm: Secondary | ICD-10-CM

## 2022-12-31 DIAGNOSIS — Z9889 Other specified postprocedural states: Secondary | ICD-10-CM

## 2022-12-31 NOTE — Progress Notes (Signed)
   REFERRING PHYSICIAN:  No referring provider defined for this encounter.  DOS: 12/14/22 L3-% posterior spinal decompression, left L5-S1 microdiscectomy  HISTORY OF PRESENT ILLNESS: Paige Medina is approximately 2 weeks status post lumbar decompression. she is doing well.  She denies any continual need for pain medication.  She denies any incisional concerns.  Overall she is very pleased with her postoperative recovery thus far.  PHYSICAL EXAMINATION:  General: Patient is well developed, well nourished, calm, collected, and in no apparent distress.   NEUROLOGICAL:  General: In no acute distress.   Awake, alert, oriented to person, place, and time.  Pupils equal round and reactive to light.  Facial tone is symmetric.     Strength:            Side Iliopsoas Quads Hamstring PF DF EHL  R 5 5 5 5 5 5   L 5 5 5 5 5 5    Incision c/d/i and healing well   ROS (Neurologic):  Negative except as noted above  IMAGING: No interval imaging to review  ASSESSMENT/PLAN:  Paige Medina is doing very well approximately 2 weeks after lumbar decompression.  We discussed activity escalation and I have advised the patient to lift up to 10 pounds until 6 weeks after surgery, then increase up to 25 pounds until 12 weeks after surgery.  After 12 weeks post-op, the patient advised to increase activity as tolerated. she will follow up with Dr. Marcell Barlow in 4 weeks for her regularly scheduled follow-up or sooner should she have any questions or concerns.  She expressed understanding and was in agreement with this plan.   Manning Charity PA-C Department of neurosurgery

## 2023-01-26 ENCOUNTER — Ambulatory Visit: Payer: PPO | Admitting: Neurosurgery

## 2023-01-26 ENCOUNTER — Encounter: Payer: Self-pay | Admitting: Neurosurgery

## 2023-01-26 VITALS — BP 130/82 | Temp 98.0°F | Ht 64.0 in | Wt 138.0 lb

## 2023-01-26 DIAGNOSIS — Z09 Encounter for follow-up examination after completed treatment for conditions other than malignant neoplasm: Secondary | ICD-10-CM

## 2023-01-26 DIAGNOSIS — Z9889 Other specified postprocedural states: Secondary | ICD-10-CM

## 2023-01-26 DIAGNOSIS — M5416 Radiculopathy, lumbar region: Secondary | ICD-10-CM

## 2023-01-26 NOTE — Progress Notes (Signed)
   REFERRING PHYSICIAN:  Eustaquio Boyden, Md [address] [place],  [place] [address]  DOS: 12/14/22 L3-4 posterior spinal decompression, left L5-S1 microdiscectomy  HISTORY OF PRESENT ILLNESS: Paige Medina is status post lumbar decompression.  She is doing extremely well.  She has minimal pain.  She is walking with a cane.  PHYSICAL EXAMINATION:  General: Patient is well developed, well nourished, calm, collected, and in no apparent distress.   NEUROLOGICAL:  General: In no acute distress.   Awake, alert, oriented to person, place, and time.  Pupils equal round and reactive to light.  Facial tone is symmetric.     Strength:            Side Iliopsoas Quads Hamstring PF DF EHL  R 5 5 5 5 5 5   L 5 5 5 5 5 5    Incision c/d/i and healing well   ROS (Neurologic):  Negative except as noted above  IMAGING: No interval imaging to review  ASSESSMENT/PLAN:  Paige Medina is doing very well after lumbar decompression.  We discussed activity escalation.  I have released her to return back to the gym.  We will see her back in approximately 6 weeks.      Venetia Night MD Department of neurosurgery

## 2023-03-04 ENCOUNTER — Ambulatory Visit: Payer: PPO | Admitting: Neurosurgery

## 2023-03-04 VITALS — BP 120/72 | Temp 97.9°F | Ht 64.0 in | Wt 138.0 lb

## 2023-03-04 DIAGNOSIS — Z9889 Other specified postprocedural states: Secondary | ICD-10-CM

## 2023-03-04 DIAGNOSIS — M48062 Spinal stenosis, lumbar region with neurogenic claudication: Secondary | ICD-10-CM

## 2023-03-04 NOTE — Progress Notes (Addendum)
   REFERRING PHYSICIAN:  Rilla Baller, Md [address] [place],  [place] [address]  DOS: 12/14/22 L3-4 posterior spinal decompression, left L5-S1 microdiscectomy  HISTORY OF PRESENT ILLNESS:  03/04/23 Ms. Monts is a 89 y.o presenting today for 6 week follow up. She is about 2 and half months status post lumbar decompression and microdiscectomy.  She is doing very well and is very pleased with her postoperative outcome thus far.  She has returned to the gym and is doing well with this. We discussed some numbness and tingling that she has in her first second and third digit when she first wakes up at night.  She does not have any significant neck pain or radiating arm pain and this is self-limited.  It is not bothering her significantly at this time.  01/26/23 Avelina DELENA Kandis is status post lumbar decompression.  She is doing extremely well.  She has minimal pain.  She is walking with a cane.  PHYSICAL EXAMINATION:  General: Patient is well developed, well nourished, calm, collected, and in no apparent distress.   NEUROLOGICAL:  General: In no acute distress.   Awake, alert, oriented to person, place, and time.  Pupils equal round and reactive to light.  Facial tone is symmetric.     Strength:            Side Iliopsoas Quads Hamstring PF DF EHL  R 5 5 5 5 5 5   L 5 5 5 5 5 5    Incision c/d/i and healing well   ROS (Neurologic):  Negative except as noted above  IMAGING: No interval imaging to review  ASSESSMENT/PLAN:  CLARABELL MATSUOKA is doing very well after lumbar decompression.  We discussed having her return return to see Dr. Clois in 6 months for final postoperative visit versus as needed follow-up.  She chose to call our office should she have any concerns.  We briefly discussed her right hand numbness but this is not bothersome to her.  We did briefly discuss a differential diagnosis and further plan of workup should it continued to worsen.  She will call  and let us  know be happy to see her back for this.  She was encouraged to call our office should she have any questions or concerns.  She expressed understanding and was in agreement with this plan.   Edsel Goods PA-C Department of neurosurgery

## 2023-03-06 ENCOUNTER — Encounter: Payer: Self-pay | Admitting: Family Medicine

## 2023-04-23 DIAGNOSIS — Z03818 Encounter for observation for suspected exposure to other biological agents ruled out: Secondary | ICD-10-CM | POA: Diagnosis not present

## 2023-04-23 DIAGNOSIS — N1832 Chronic kidney disease, stage 3b: Secondary | ICD-10-CM | POA: Diagnosis not present

## 2023-04-23 DIAGNOSIS — Z87898 Personal history of other specified conditions: Secondary | ICD-10-CM | POA: Diagnosis not present

## 2023-04-23 DIAGNOSIS — R590 Localized enlarged lymph nodes: Secondary | ICD-10-CM | POA: Diagnosis not present

## 2023-04-23 DIAGNOSIS — H9201 Otalgia, right ear: Secondary | ICD-10-CM | POA: Diagnosis not present

## 2023-05-21 DIAGNOSIS — S8991XA Unspecified injury of right lower leg, initial encounter: Secondary | ICD-10-CM | POA: Diagnosis not present

## 2023-05-25 ENCOUNTER — Ambulatory Visit (INDEPENDENT_AMBULATORY_CARE_PROVIDER_SITE_OTHER): Payer: PPO | Admitting: Family Medicine

## 2023-05-25 ENCOUNTER — Encounter: Payer: Self-pay | Admitting: Family Medicine

## 2023-05-25 VITALS — BP 184/70 | HR 64 | Temp 97.9°F | Ht 64.0 in | Wt 136.1 lb

## 2023-05-25 DIAGNOSIS — I10 Essential (primary) hypertension: Secondary | ICD-10-CM

## 2023-05-25 DIAGNOSIS — S8991XA Unspecified injury of right lower leg, initial encounter: Secondary | ICD-10-CM | POA: Insufficient documentation

## 2023-05-25 DIAGNOSIS — M48062 Spinal stenosis, lumbar region with neurogenic claudication: Secondary | ICD-10-CM | POA: Diagnosis not present

## 2023-05-25 NOTE — Patient Instructions (Signed)
 BP staying too high again today.  Start monitoring more closely BP at home, log sheet provided today. Drop off after a few weeks to review readings and titrate medicine accordingly.   Otherwise continue current medicines  Return as needed or in 6 months for physical/wellness visit

## 2023-05-25 NOTE — Progress Notes (Addendum)
 Ph: [phone] Fax: [phone]   Patient ID: Paige Medina, female    DOB: 1933-12-11, 89 y.o.   MRN: [id number]  This visit was conducted in person.  BP (!) 184/70   Pulse 64   Temp 97.9 F (36.6 C) (Oral)   Ht 5\' 4"  (1.626 m)   Wt 136 lb 2 oz (61.7 kg)   SpO2 94%   BMI 23.37 kg/m   188/66 on BP recheck   CC: 6 mo f/u visit  Subjective:   HPI: Paige Medina is a 89 y.o. female presenting on 05/25/2023 for Medical Management of Chronic Issues (Here for 6 mo f/u. Pt provided log [to keep] of recent BP readings. )   Discussed recently discovered [facility] laboratory miscalculation - the UACR calculation in the software of the system was incorrect but the absolute levels of microalbumin and creatinine were correct.   Lives at [facility].   Recent R knee injury concern for meniscal tear sustained while washing dishes. Currently on prednisone course for this. Has Friday f/u planned with ortho Janell Quiet PA).   S/p L3/4 posterior spinal decompression, left L5/S1 lumbar laminectomy by Dr Marcell Barlow (12/2022). She has recovered wonderfully after this!  Component of white coat HTN - Compliant with current antihypertensive regimen of hydralazine 50mg  BID, olmesartan 40mg  daily.  Does check blood pressures at home: and brings log - see below: 102-161/55-70s, HR 50-60s. No low blood pressure readings or symptoms of dizziness/syncope.  Denies HA, vision changes, CP/tightness, SOB, leg swelling.  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 Previous holter monitor 2019. Echocardiogram 11/2017 - grade 1 diastolic dysfunction, normal LV function. Sees yearly.   Prediabetes - watching sugar in diet.  Lab Results  Component Value Date   HGBA1C 6.1 11/17/2022       Relevant past medical, surgical, family and social history reviewed and updated as indicated. Interim medical  history since our last visit reviewed. Allergies and medications reviewed and updated. Outpatient Medications Prior to Visit  Medication Sig Dispense Refill   Ascorbic Acid (VITAMIN C) 1000 MG tablet Take 1,000 mg by mouth daily.     beta carotene w/minerals (OCUVITE) tablet Take 1 tablet by mouth daily.     Blood Glucose Monitoring Suppl (ONE TOUCH ULTRA 2) w/Device KIT Use as directed 1 each 0   Boswellia-Glucosamine-Vit D (OSTEO BI-FLEX ONE PER DAY PO) Take 1 tablet by mouth 2 (two) times daily.     Calcium Carb-Cholecalciferol (CALTRATE 600+D3) 600-20 MG-MCG TABS Take 1 tablet by mouth daily.     Cholecalciferol (VITAMIN D3) 25 MCG (1000 UT) CAPS Take 1 capsule (1,000 Units total) by mouth daily.     clobetasol (TEMOVATE) 0.05 % external solution APPLY SMALL AMOUNT TOPICALLY TO THE AFFECTED AREA ONCE DAILY UP TO 5 DAYS WEEKLY (Patient taking differently: Apply 1 Application topically 2 (two) times daily as needed. APPLY SMALL AMOUNT TOPICALLY TO THE AFFECTED AREA ONCE DAILY UP TO 5 DAYS WEEKLY) 50 mL 3   Coenzyme Q10 (CO Q-10) 200 MG CAPS Take 400 mg by mouth every morning.     cyanocobalamin (VITAMIN B12) 1000 MCG tablet Take 1,000 mcg by mouth every other day.     docusate sodium (COLACE) 100 MG capsule Take 100-200 mg by mouth 2 (two) times daily. 100 mg am, 200 mg hs     doxycycline (VIBRA-TABS)  100 MG tablet Take 1 tablet (100 mg total) by mouth daily as needed (rosacea flare).     glucose blood (ONETOUCH ULTRA) test strip Use as instructed to check blood sugar once daily. 100 each 2   hydrALAZINE (APRESOLINE) 50 MG tablet Take 1 tablet (50 mg total) by mouth in the morning and at bedtime. 180 tablet 4   lovastatin (MEVACOR) 40 MG tablet Take 1 tablet (40 mg total) by mouth daily. (Patient taking differently: Take 40 mg by mouth at bedtime.) 90 tablet 4   metroNIDAZOLE (METROGEL) 0.75 % gel APPLY SMALL AMOUNT TOPICALLY TO THE AFFECTED AREA 1 TO 2 TIMES DAILY AS DIRECTED 45 g 11    olmesartan (BENICAR) 40 MG tablet TAKE 1 TABLET(40 MG) BY MOUTH DAILY (Patient taking differently: Take 40 mg by mouth at bedtime.) 90 tablet 3   OneTouch Delica Lancets 33G MISC Use as instructed to check blood sugar once daily. 100 each 2   predniSONE (DELTASONE) 20 MG tablet Take by mouth.     Probiotic Product (PROBIOTIC DAILY PO) Take 1 capsule by mouth daily.     triamcinolone cream (KENALOG) 0.1 % APPLY TOPICALLY TO THE AFFECTED AREA TWICE DAILY AS NEEDED 30 g 1   Zinc 30 MG TABS Take 1 tablet by mouth daily.     No facility-administered medications prior to visit.     Per HPI unless specifically indicated in ROS section below Review of Systems  Objective:  BP (!) 184/70   Pulse 64   Temp 97.9 F (36.6 C) (Oral)   Ht 5\' 4"  (1.626 m)   Wt 136 lb 2 oz (61.7 kg)   SpO2 94%   BMI 23.37 kg/m   Wt Readings from Last 3 Encounters:  05/25/23 136 lb 2 oz (61.7 kg)  03/04/23 138 lb (62.6 kg)  01/26/23 138 lb (62.6 kg)      Physical Exam Vitals and nursing note reviewed.  Constitutional:      Appearance: Normal appearance. She is not ill-appearing.  HENT:     Mouth/Throat:     Mouth: Mucous membranes are moist.     Pharynx: Oropharynx is clear. No oropharyngeal exudate or posterior oropharyngeal erythema.  Eyes:     Extraocular Movements: Extraocular movements intact.     Conjunctiva/sclera: Conjunctivae normal.     Pupils: Pupils are equal, round, and reactive to light.  Cardiovascular:     Rate and Rhythm: Normal rate and regular rhythm.     Pulses: Normal pulses.     Heart sounds: Murmur (3/6 systolic throughout) heard.  Pulmonary:     Effort: Pulmonary effort is normal. No respiratory distress.     Breath sounds: Normal breath sounds. No wheezing, rhonchi or rales.  Musculoskeletal:     Right lower leg: No edema.     Left lower leg: No edema.     Comments: Has brace on right knee   Neurological:     Mental Status: She is alert.  Psychiatric:        Mood and  Affect: Mood normal.        Behavior: Behavior normal.       Lab Results  Component Value Date   NA 142 11/17/2022   CL 104 11/17/2022   K 4.4 11/17/2022   CO2 29 11/17/2022   BUN 19 11/17/2022   CREATININE 1.04 11/17/2022   GFR 48.31 (L) 11/17/2022   CALCIUM 10.0 11/17/2022   PHOS 3.7 11/17/2022   ALBUMIN 4.2 11/17/2022   GLUCOSE  104 (H) 11/17/2022    Lab Results  Component Value Date   WBC 6.7 11/17/2022   HGB 13.4 11/17/2022   HCT 41.5 11/17/2022   MCV 96.5 11/17/2022   PLT 260.0 11/17/2022    Lab Results  Component Value Date   ALT 17 11/17/2022   AST 18 11/17/2022   ALKPHOS 77 11/17/2022   BILITOT 0.6 11/17/2022    Assessment & Plan:   Problem List Items Addressed This Visit     White coat syndrome with diagnosis of hypertension - Primary   Chronic, BP markedly elevated in office today. Home log she brings shows elevated readings more recnetly as well, but very infrequently checking (twice in 04/2023, once in 05/2023). Rec more frequent BP checks - new log sheet provided.  She will drop this off in 1-2 wks and will titrate meds accordingly. H/o intolerance to multiple BP meds in the past Would likely increase hydralazine to 50mg  TID. Already maxed out on olmesartan dosing.       Neurogenic claudication due to lumbar spinal stenosis   Marked improvement after lumbar spine surgery 12/2022  Appreciate neurosurgery care.       Relevant Medications   predniSONE (DELTASONE) 20 MG tablet   Right knee injury, initial encounter   Injured right knee late last week s/p [facility] eval, concern for meniscal injury - has ortho f/u planned at end of this week.         No orders of the defined types were placed in this encounter.   No orders of the defined types were placed in this encounter.   Patient Instructions  BP staying too high again today.  Start monitoring more closely BP at home, log sheet provided today. Drop off after a few weeks to review readings and  titrate medicine accordingly.   Otherwise continue current medicines  Return as needed or in 6 months for physical/wellness visit   Follow up plan: Return in about 6 months (around 11/25/2023) for annual exam, prior fasting for blood work, medicare wellness visit.  Eustaquio Boyden, MD

## 2023-05-25 NOTE — Assessment & Plan Note (Signed)
 Chronic, BP markedly elevated in office today. Home log she brings shows elevated readings more recnetly as well, but very infrequently checking (twice in 04/2023, once in 05/2023). Rec more frequent BP checks - new log sheet provided.  She will drop this off in 1-2 wks and will titrate meds accordingly. H/o intolerance to multiple BP meds in the past Would likely increase hydralazine to 50mg  TID. Already maxed out on olmesartan dosing.

## 2023-05-25 NOTE — Assessment & Plan Note (Signed)
 Injured right knee late last week s/p [facility]CC eval, concern for meniscal injury - has ortho f/u planned at end of this week.

## 2023-05-25 NOTE — Assessment & Plan Note (Signed)
 Marked improvement after lumbar spine surgery 12/2022  Appreciate neurosurgery care.

## 2023-05-28 DIAGNOSIS — S83241A Other tear of medial meniscus, current injury, right knee, initial encounter: Secondary | ICD-10-CM | POA: Diagnosis not present

## 2023-05-28 DIAGNOSIS — M1711 Unilateral primary osteoarthritis, right knee: Secondary | ICD-10-CM | POA: Diagnosis not present

## 2023-06-03 ENCOUNTER — Other Ambulatory Visit: Payer: Self-pay | Admitting: Student

## 2023-06-03 DIAGNOSIS — M1711 Unilateral primary osteoarthritis, right knee: Secondary | ICD-10-CM

## 2023-06-03 DIAGNOSIS — S83241A Other tear of medial meniscus, current injury, right knee, initial encounter: Secondary | ICD-10-CM

## 2023-06-03 DIAGNOSIS — M25561 Pain in right knee: Secondary | ICD-10-CM

## 2023-06-08 ENCOUNTER — Ambulatory Visit
Admission: RE | Admit: 2023-06-08 | Discharge: 2023-06-08 | Disposition: A | Discharge status: Home / Self Care | Source: Ambulatory Visit | Attending: Student | Admitting: Student

## 2023-06-08 DIAGNOSIS — M25461 Effusion, right knee: Secondary | ICD-10-CM | POA: Diagnosis not present

## 2023-06-08 DIAGNOSIS — M25561 Pain in right knee: Secondary | ICD-10-CM | POA: Insufficient documentation

## 2023-06-08 DIAGNOSIS — M1711 Unilateral primary osteoarthritis, right knee: Secondary | ICD-10-CM | POA: Insufficient documentation

## 2023-06-08 DIAGNOSIS — R609 Edema, unspecified: Secondary | ICD-10-CM | POA: Diagnosis not present

## 2023-06-08 DIAGNOSIS — S83241A Other tear of medial meniscus, current injury, right knee, initial encounter: Secondary | ICD-10-CM | POA: Diagnosis not present

## 2023-06-08 DIAGNOSIS — M7121 Synovial cyst of popliteal space [Baker], right knee: Secondary | ICD-10-CM | POA: Diagnosis not present

## 2023-06-11 ENCOUNTER — Telehealth: Payer: Self-pay | Admitting: Family Medicine

## 2023-06-11 DIAGNOSIS — I10 Essential (primary) hypertension: Secondary | ICD-10-CM

## 2023-06-11 NOTE — Telephone Encounter (Signed)
 Placed log in Dr. Timoteo Expose box.

## 2023-06-11 NOTE — Telephone Encounter (Signed)
 Pt's husband, Onalee Hua, brought by pt's BP log for Dr. Reece Agar to review. Log is in [employer]. Call back # [phone]

## 2023-06-14 MED ORDER — HYDRALAZINE HCL 50 MG PO TABS: 50.0000 mg | ORAL_TABLET | Freq: Three times a day (TID) | ORAL | 1 refills | Status: DC

## 2023-06-14 NOTE — Telephone Encounter (Signed)
Spoke with pt relaying Dr. G's message. Pt verbalizes understanding.  

## 2023-06-14 NOTE — Telephone Encounter (Signed)
 BP readings widely fluctuate - 119-186/62-80s, HR 50-60s  Current regimen:  Olmesartan 40mg  nightly, hydralazine 50mg  bid  Brings long list of meds previously tried with poor effect - see attached.   H/o amlodipine and metoprolol intolerance.  Lab Results  Component Value Date   NA 142 11/17/2022   CL 104 11/17/2022   K 4.4 11/17/2022   CO2 29 11/17/2022   BUN 19 11/17/2022   CREATININE 1.04 11/17/2022   GFR 48.31 (L) 11/17/2022   CALCIUM 10.0 11/17/2022   PHOS 3.7 11/17/2022   ALBUMIN 4.2 11/17/2022   GLUCOSE 104 (H) 11/17/2022

## 2023-06-14 NOTE — Telephone Encounter (Signed)
 Recommend: Change olmesartan to 40mg  in the mornings.  Increase hydralazine 25mg  to TID dosing. Let us  know if develops low BP readings on this.  Consider spironolactone Consider eval for secondary causes of hypertension.

## 2023-06-14 NOTE — Addendum Note (Signed)
 Addended by: Claire Crick on: 06/14/2023 08:05 AM   Modules accepted: Orders

## 2023-06-28 ENCOUNTER — Ambulatory Visit (INDEPENDENT_AMBULATORY_CARE_PROVIDER_SITE_OTHER): Admitting: Internal Medicine

## 2023-06-28 ENCOUNTER — Encounter: Payer: Self-pay | Admitting: Internal Medicine

## 2023-06-28 ENCOUNTER — Ambulatory Visit: Payer: Self-pay | Admitting: *Deleted

## 2023-06-28 VITALS — BP 118/62 | HR 65 | Temp 98.4°F | Ht 64.0 in | Wt 137.0 lb

## 2023-06-28 DIAGNOSIS — I952 Hypotension due to drugs: Secondary | ICD-10-CM

## 2023-06-28 DIAGNOSIS — I10 Essential (primary) hypertension: Secondary | ICD-10-CM | POA: Diagnosis not present

## 2023-06-28 DIAGNOSIS — I959 Hypotension, unspecified: Secondary | ICD-10-CM | POA: Insufficient documentation

## 2023-06-28 MED ORDER — HYDRALAZINE HCL 50 MG PO TABS: 25.0000 mg | ORAL_TABLET | Freq: Three times a day (TID) | ORAL | 1 refills | Status: DC

## 2023-06-28 NOTE — Assessment & Plan Note (Signed)
 BP Readings from Last 3 Encounters:  06/28/23 118/62  05/25/23 (!) 184/70  03/04/23 120/72   Has had symptomatic hypotension at current doses Skipped the hydralazine  this morning--and still quite low Will continue the olmesartan  40 Reduce the hydralazine  to 25 tid She will continue to monitor the BP regularly

## 2023-06-28 NOTE — Telephone Encounter (Signed)
 Appreciate Dr Alphonsus Sias seeing pt
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
## 2023-06-28 NOTE — Telephone Encounter (Signed)
   Chief Complaint: irregular heart rate - getting worse with medication change:hydrALAZINE  (APRESOLINE ) 50 MG tablet  Symptoms: low BP, dizziness, jittery, irregular heart rate Frequency: 2 weeks  Disposition: [] ED /[] Urgent Care (no appt availability in office) / [x] Appointment(In office/virtual)/ []  [facility]/ [] Home Care/ [] Refused Recommended Disposition /[] [place] [facility]/ []  Follow-up with PCP Additional Notes: Recent medication change- increased dosing- hydrALAZINE  (APRESOLINE ) 50 MG tablet to 3/day- not doing well on this dosing- having low BP readings, dizziness, jittery- needs to discuss options  Copied from CRM [id number]. Topic: Clinical - Red Word Triage >> Jun 28, 2023  8:20 AM Baldomero Bone wrote: Red Word that prompted transfer to Nurse Triage: Patient is calling to advise that the hydrALAZINE  (APRESOLINE ) 50 MG tablet; 93-63, Saturday was light headed, and almost passed out; and 105/65 with heart palpitations; [phone] Reason for Disposition . [1] Skipped or extra beat(s) AND [2] occurs 4 or more times per minute  Answer Assessment - Initial Assessment Questions 1. DESCRIPTION: "Please describe your heart rate or heartbeat that you are having" (e.g., fast/slow, regular/irregular, skipped or extra beats, "palpitations")     Irregular heart rate, low BP- patient states she is not doing well on  3 hydralazine   2. ONSET: "When did it start?" (Minutes, hours or days)      2 weeks ago- had increase in medication  3. DURATION: "How long does it last" (e.g., seconds, minutes, hours)     Started 2 weeks ago- but becoming more pronounced- can feel heart beat 4. PATTERN "Does it come and go, or has it been constant since it started?"  "Does it get worse with exertion?"   "Are you feeling it now?"     Continuous - all weekend 5. TAP: "Using your hand, can you tap out what you are feeling on a chair or table in front of you, so that I can hear?" (Note: not all patients  can do this)       na 6. HEART RATE: "Can you tell me your heart rate?" "How many beats in 15 seconds?"  (Note: not all patients can do this)       BP 105/65, P71 7. RECURRENT SYMPTOM: "Have you ever had this before?" If Yes, ask: "When was the last time?" and "What happened that time?"      Patient states provider has been trying to get heart rate regulated 8. CAUSE: "What do you think is causing the palpitations?"     Change in medication  9. CARDIAC HISTORY: "Do you have any history of heart disease?" (e.g., heart attack, angina, bypass surgery, angioplasty, arrhythmia)      Hx heart disease 10. OTHER SYMPTOMS: "Do you have any other symptoms?" (e.g., dizziness, chest pain, sweating, difficulty breathing)       Jittery today- fells "uneven"  Protocols used: Heart Rate and Heartbeat Questions-A-AH

## 2023-06-28 NOTE — Telephone Encounter (Signed)
 Noted---I will assess at today's visit

## 2023-06-30 ENCOUNTER — Emergency Department
Admission: EM | Admit: 2023-06-30 | Discharge: 2023-06-30 | Disposition: A | Discharge status: Home / Self Care | Attending: Emergency Medicine | Admitting: Emergency Medicine

## 2023-06-30 ENCOUNTER — Emergency Department

## 2023-06-30 ENCOUNTER — Telehealth: Payer: Self-pay | Admitting: Cardiovascular Disease

## 2023-06-30 ENCOUNTER — Other Ambulatory Visit: Payer: Self-pay

## 2023-06-30 DIAGNOSIS — I10 Essential (primary) hypertension: Secondary | ICD-10-CM | POA: Diagnosis not present

## 2023-06-30 DIAGNOSIS — R002 Palpitations: Secondary | ICD-10-CM | POA: Diagnosis not present

## 2023-06-30 DIAGNOSIS — R079 Chest pain, unspecified: Secondary | ICD-10-CM | POA: Diagnosis not present

## 2023-06-30 DIAGNOSIS — I493 Ventricular premature depolarization: Secondary | ICD-10-CM

## 2023-06-30 DIAGNOSIS — E119 Type 2 diabetes mellitus without complications: Secondary | ICD-10-CM | POA: Insufficient documentation

## 2023-06-30 DIAGNOSIS — R918 Other nonspecific abnormal finding of lung field: Secondary | ICD-10-CM | POA: Diagnosis not present

## 2023-06-30 LAB — BASIC METABOLIC PANEL WITH GFR
Anion gap: 10 (ref 5–15)
BUN: 21 mg/dL (ref 8–23)
CO2: 23 mmol/L (ref 22–32)
Calcium: 9.2 mg/dL (ref 8.9–10.3)
Chloride: 106 mmol/L (ref 98–111)
Creatinine, Ser: 0.96 mg/dL (ref 0.44–1.00)
GFR, Estimated: 57 mL/min — ABNORMAL LOW (ref >60)
Glucose, Bld: 131 mg/dL — ABNORMAL HIGH (ref 70–99)
Potassium: 3.9 mmol/L (ref 3.5–5.1)
Sodium: 139 mmol/L (ref 135–145)

## 2023-06-30 LAB — CBC
HCT: 38.8 % (ref 36.0–46.0)
Hemoglobin: 12.8 g/dL (ref 12.0–15.0)
MCH: 31.7 pg (ref 26.0–34.0)
MCHC: 33 g/dL (ref 30.0–36.0)
MCV: 96 fL (ref 80.0–100.0)
Platelets: 250 10*3/uL (ref 150–400)
RBC: 4.04 MIL/uL (ref 3.87–5.11)
RDW: 12.8 % (ref 11.5–15.5)
WBC: 7.9 10*3/uL (ref 4.0–10.5)
nRBC: 0 % (ref 0.0–0.2)

## 2023-06-30 LAB — TROPONIN I (HIGH SENSITIVITY): Troponin I (High Sensitivity): 8 ng/L (ref <18)

## 2023-06-30 NOTE — ED Provider Notes (Signed)
 [facility] Provider Note    Event Date/Time   First MD Initiated Contact with Patient 06/30/23 [phone]     (approximate)  History   Chief Complaint: Palpitations  HPI  Paige Medina is a 89 y.o. female with a past medical history of hypertension, hyperlipidemia, diabetes, PVCs, presents to the emergency department for irregular heart rhythm as well as changes in her blood pressure.  According to the patient her cardiologist Dr. Alvenia Aus as well as her PCP have been working on blood pressure control for the patient.  They have made several recent changes to her blood pressure medications.  More recently they have decreased her hydralazine  from 50 mg 3 times daily to 25 mg 3 times daily due to episodes of hypotension.  Patient states over the past 1 week she has felt like her heart is in an irregular rhythm, she called her cardiologist office however they could not see her so they referred her to the emergency department for evaluation.  Overall the patient appears well, no distress.  Patient denies any chest pain.  Denies any shortness of breath more so than normal.  Physical Exam   Triage Vital Signs: ED Triage Vitals  Encounter Vitals Group     BP 06/30/23 0928 (!) 190/80     Systolic BP Percentile --      Diastolic BP Percentile --      Pulse Rate 06/30/23 0928 69     Resp 06/30/23 0928 20     Temp 06/30/23 0928 98.1 F (36.7 C)     Temp Source 06/30/23 0928 Oral     SpO2 06/30/23 0928 98 %     Weight --      Height --      Head Circumference --      Peak Flow --      Pain Score 06/30/23 0929 2     Pain Loc --      Pain Education --      Exclude from Growth Chart --     Most recent vital signs: Vitals:   06/30/23 0928  BP: (!) 190/80  Pulse: 69  Resp: 20  Temp: 98.1 F (36.7 C)  SpO2: 98%    General: Awake, no distress.  CV:  Good peripheral perfusion.  Somewhat irregular rhythm around 80 bpm. Resp:  Normal effort.  Equal breath sounds  bilaterally.  Abd:  No distention.  Soft, nontender.  No rebound or guarding.  ED Results / Procedures / Treatments   EKG  EKG viewed and interpreted by myself shows a sinus rhythm at 72 bpm with frequent PVCs.  Slight PR prolongation consistent with first-degree AV block, normal axis, no ST changes.  RADIOLOGY  I have reviewed and interpreted chest x-ray images.  No obvious consolidation on my evaluation. Radiology is read the x-ray as atelectasis versus infiltrate in the left lung base.   MEDICATIONS ORDERED IN ED: Medications - No data to display   IMPRESSION / MDM / ASSESSMENT AND PLAN / ED COURSE  I reviewed the triage vital signs and the nursing notes.  Patient's presentation is most consistent with acute presentation with potential threat to life or bodily function.  Patient presents to the emergency department for fluctuations in her blood pressure.  Patient states her blood pressure was running high her doctor has made several changes to her blood pressure medication and then her blood pressure began dipping low to her doctor just decreased her hydralazine  from 50 mg  3 times daily to 25 mg 3 times daily.  Patient states over the past 1 week she has felt her heart beating irregularly.  Patient states a longstanding history of PVCs but did not know if she was in some other type of arrhythmia so she came to the emergency department after being referred by her cardiologist office.  Patient denies any chest pain.  However given the patient's palpitations and symptoms we will obtain an EKG labs including a cardiac panel and continue to closely monitor.  Patient's EKG appears most consistent with a sinus rhythm with frequent PVCs every 4th or 5th beat.  Patient noted to be hypertensive currently 190/80.  Patient's workup is reassuring.  CBC is normal, chemistry is normal.  Troponin reassuringly negative.  Chest x-ray shows atelectasis versus infiltrate.  Patient has no infectious  symptoms.  Given the patient's reassuring workup we will have the patient follow-up with her cardiologist for discussion of any further blood pressure medication changes as well as her frequent PVCs.  FINAL CLINICAL IMPRESSION(S) / ED DIAGNOSES   PVCs Palpitations Hypertension   Note:  This document was prepared using Dragon voice recognition software and may include unintentional dictation errors.   Ruth Cove, MD 06/30/23 [phone]

## 2023-06-30 NOTE — ED Triage Notes (Signed)
 Pt to ED via POV from home. PT reports has been dealing with BP fluctuating and has had several BP med changes over the last few weeks. Pt reports started to feel irregular heart rhythm x1 wk. Pt called cardiologist office this morning and advised her to come to ED for evaluation. No blood thinner.

## 2023-06-30 NOTE — Telephone Encounter (Signed)
 Patient c/o Palpitations:  STAT if patient reporting lightheadedness, shortness of breath, or chest pain  How long have you had palpitations/irregular HR/ Afib? Are you having the symptoms now? She states her hr has been irregular since bp changes by PCP 2 weeks ago   Are you currently experiencing lightheadedness, SOB or CP? States her chest feel tight currently   Do you have a history of afib (atrial fibrillation) or irregular heart rhythm? No   Have you checked your BP or HR? (document readings if available):  96/63  144/78  Are you experiencing any other symptoms? No

## 2023-06-30 NOTE — Telephone Encounter (Signed)
 The patient called to report experiencing intermittent chest tightness and stated that she is unable to identify any precipitating or alleviating factors. She also reported having issues with her blood pressure and irregular heartbeats. The patient mentioned that her PCP increased her hydralazine  dosage to TID, and since then, she has been experiencing episodes of low blood pressure, which cause her to feel dizzy when it drops into the 90s. Additionally, she described irregular heartbeats, stating that her heart will beat three times, then stop, followed by four beats and a stop, then 20 beats and another stop. She provided her pulse range from 58-71.  Based on the current symptoms of chest pain, the nurse recommended that the patient go to the emergency room for further evaluation. The patient verbalized understanding.

## 2023-07-02 ENCOUNTER — Ambulatory Visit: Attending: Nurse Practitioner | Admitting: Nurse Practitioner

## 2023-07-02 ENCOUNTER — Encounter: Payer: Self-pay | Admitting: Nurse Practitioner

## 2023-07-02 VITALS — BP 160/78 | HR 63 | Ht 64.0 in | Wt 135.0 lb

## 2023-07-02 DIAGNOSIS — E782 Mixed hyperlipidemia: Secondary | ICD-10-CM | POA: Diagnosis not present

## 2023-07-02 DIAGNOSIS — I10 Essential (primary) hypertension: Secondary | ICD-10-CM

## 2023-07-02 DIAGNOSIS — N1831 Chronic kidney disease, stage 3a: Secondary | ICD-10-CM

## 2023-07-02 DIAGNOSIS — I493 Ventricular premature depolarization: Secondary | ICD-10-CM | POA: Diagnosis not present

## 2023-07-02 MED ORDER — SPIRONOLACTONE 25 MG PO TABS: 25.0000 mg | ORAL_TABLET | Freq: Every day | ORAL | 3 refills | Status: DC
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
## 2023-07-02 NOTE — Patient Instructions (Signed)
 Medication Instructions:  Your physician recommends the following medication changes.  STOP TAKING: Hydralazine   START TAKING: Spironolactone 25 mg 1 tablet once per day.  *If you need a refill on your cardiac medications before your next appointment, please call your pharmacy*  Lab Work: Your provider would like for you to return in One Week to have the following labs drawn: Basic Metabolic Panel.   Please go to [facility] [address] ([facility] [address], [place] [address] You do not need an appointment.  They are open from 8 am- 4:30 pm.  Lunch from 1:00 pm- 2:00 pm You DO NOT need to be fasting.   You may also go to one of the following [facility]:  [address] [place], [place] [address] Phone: [phone] Lab hours: Mon-Fri 8 am- 5 pm    Lunch 12 pm- 1 pm  [address] [place],  [place]  [address]  [place]  Phone: [phone] Lab hours: 7 am- 4 pm Lunch 12 pm-1 pm   [address] [place],  [place]  [address]  [place]  Phone: [phone] Lab hours: Mon-Fri 8 am- 5 pm    Lunch 12 pm- 1 pm  If you have labs (blood work) drawn today and your tests are completely normal, you will receive your results only by: MyChart Message (if you have MyChart) OR A paper copy in the mail If you have any lab test that is abnormal or we need to change your treatment, we will call you to review the results.   Follow-Up: At [facility], you and your health needs are our priority.  As part of our continuing mission to provide you with exceptional heart care, our providers are all part of one team.  This team includes your primary Cardiologist (physician) and Advanced Practice Providers or APPs (Physician Assistants and Nurse Practitioners) who all work together to provide you with the care you need, when you need it.  Your next appointment:   1 month(s)  Provider:   You may see Antionette Kirks, MD or one of the following Advanced Practice Providers on your  designated Care Team:   Laneta Pintos, NP Gildardo Labrador, PA-C Varney Gentleman, PA-C Cadence Divernon, PA-C Ronald Cockayne, NP Morey Ar, NP    We recommend signing up for the patient portal called "MyChart".  Sign up information is provided on this After Visit Summary.  MyChart is used to connect with patients for Virtual Visits (Telemedicine).  Patients are able to view lab/test results, encounter notes, upcoming appointments, etc.  Non-urgent messages can be sent to your provider as well.   To learn more about what you can do with MyChart, go to [email or web address].

## 2023-07-08 ENCOUNTER — Ambulatory Visit: Payer: PPO | Admitting: Dermatology

## 2023-07-08 ENCOUNTER — Encounter: Payer: Self-pay | Admitting: Dermatology

## 2023-07-08 DIAGNOSIS — Z85828 Personal history of other malignant neoplasm of skin: Secondary | ICD-10-CM

## 2023-07-08 DIAGNOSIS — L820 Inflamed seborrheic keratosis: Secondary | ICD-10-CM

## 2023-07-08 DIAGNOSIS — L821 Other seborrheic keratosis: Secondary | ICD-10-CM

## 2023-07-08 DIAGNOSIS — Z1283 Encounter for screening for malignant neoplasm of skin: Secondary | ICD-10-CM | POA: Diagnosis not present

## 2023-07-08 DIAGNOSIS — D692 Other nonthrombocytopenic purpura: Secondary | ICD-10-CM

## 2023-07-08 DIAGNOSIS — L814 Other melanin hyperpigmentation: Secondary | ICD-10-CM

## 2023-07-08 DIAGNOSIS — W908XXA Exposure to other nonionizing radiation, initial encounter: Secondary | ICD-10-CM

## 2023-07-08 DIAGNOSIS — L603 Nail dystrophy: Secondary | ICD-10-CM

## 2023-07-08 DIAGNOSIS — L578 Other skin changes due to chronic exposure to nonionizing radiation: Secondary | ICD-10-CM | POA: Diagnosis not present

## 2023-07-08 DIAGNOSIS — L570 Actinic keratosis: Secondary | ICD-10-CM | POA: Diagnosis not present

## 2023-07-08 DIAGNOSIS — L219 Seborrheic dermatitis, unspecified: Secondary | ICD-10-CM

## 2023-07-08 DIAGNOSIS — D1801 Hemangioma of skin and subcutaneous tissue: Secondary | ICD-10-CM

## 2023-07-08 DIAGNOSIS — Z7189 Other specified counseling: Secondary | ICD-10-CM

## 2023-07-08 DIAGNOSIS — Z79899 Other long term (current) drug therapy: Secondary | ICD-10-CM

## 2023-07-08 DIAGNOSIS — L409 Psoriasis, unspecified: Secondary | ICD-10-CM

## 2023-07-08 DIAGNOSIS — B351 Tinea unguium: Secondary | ICD-10-CM | POA: Diagnosis not present

## 2023-07-08 DIAGNOSIS — L719 Rosacea, unspecified: Secondary | ICD-10-CM

## 2023-07-08 DIAGNOSIS — L729 Follicular cyst of the skin and subcutaneous tissue, unspecified: Secondary | ICD-10-CM

## 2023-07-08 MED ORDER — CLOBETASOL PROPIONATE 0.05 % EX SOLN: CUTANEOUS | 3 refills | Status: AC

## 2023-07-08 MED ORDER — DOXYCYCLINE MONOHYDRATE 100 MG PO CAPS: ORAL_CAPSULE | ORAL | 6 refills | Status: AC

## 2023-07-08 NOTE — Progress Notes (Signed)
 Follow-Up Visit   Subjective  Paige Medina is a 89 y.o. female who presents for the following: Skin Cancer Screening and Full Body Skin Exam Hx of bcc, hx of nail dystrophy, hx of isks, hx of breast cancer in left breast,   Some spots at left abdomen that were treated with ln2 that did not go away,  Spots at left hand would like checked.  Spots back of neck  Would like toenails checked.   The patient presents for Total-Body Skin Exam (TBSE) for skin cancer screening and mole check. The patient has spots, moles and lesions to be evaluated, some may be new or changing and the patient may have concern these could be cancer.  The following portions of the chart were reviewed this encounter and updated as appropriate: medications, allergies, medical history  Review of Systems:  No other skin or systemic complaints except as noted in HPI or Assessment and Plan.  Objective  Well appearing patient in no apparent distress; mood and affect are within normal limits.  A full examination was performed including scalp, head, eyes, ears, nose, lips, neck, chest, axillae, abdomen, back, buttocks, bilateral upper extremities, bilateral lower extremities, hands, feet, fingers, toes, fingernails, and toenails. All findings within normal limits unless otherwise noted below.   Relevant physical exam findings are noted in the Assessment and Plan.  b/l hands x 4 (4) Erythematous thin papules/macules with gritty scale.  Neck - Posterior x 1 Erythematous stuck-on, waxy papule or plaque  Assessment & Plan   SKIN CANCER SCREENING PERFORMED TODAY.  ACTINIC DAMAGE - Chronic condition, secondary to cumulative UV/sun exposure - diffuse scaly erythematous macules with underlying dyspigmentation - Recommend daily broad spectrum sunscreen SPF 30+ to sun-exposed areas, reapply every 2 hours as needed.  - Staying in the shade or wearing long sleeves, sun glasses (UVA+UVB protection) and wide brim hats (4-inch  brim around the entire circumference of the hat) are also recommended for sun protection.  - Call for new or changing lesions.  LENTIGINES, SEBORRHEIC KERATOSES, HEMANGIOMAS - Benign normal skin lesions - Benign-appearing - Call for any changes  MELANOCYTIC NEVI - Tan-brown and/or pink-flesh-colored symmetric macules and papules - Benign appearing on exam today - Observation - Call clinic for new or changing moles - Recommend daily use of broad spectrum spf 30+ sunscreen to sun-exposed areas.   Purpura - Chronic; persistent and recurrent.  Treatable, but not curable. - Violaceous macules and patches - Benign - Related to trauma, age, sun damage and/or use of blood thinners, chronic use of topical and/or oral steroids - Observe - Can use OTC arnica containing moisturizer such as Dermend Bruise Formula if desired - Call for worsening or other concerns  Nail dystrophy Toenails Exam: nail dystrophy  Chronic and persistent condition with duration or expected duration over one year. Condition is symptomatic / bothersome to patient. Not to goal. 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  Discussed with patient their insurance will be billed.  Advised the patient they may get a bill for a portion that's not covered by their insurance.  Should the patient have any issues with their remaining responsibility they will not be sent to collections but Bula Carney will work with them internally on any remaining balance.    Rosacea Face, scalp Exam: mild erythema at mid face  and nose Chronic and persistent condition with duration or expected duration over one year. Condition is symptomatic / bothersome to patient. Not to goal. Rosacea  Rosacea is a chronic progressive skin condition usually  affecting the face of adults, causing redness and/or acne bumps. It is treatable but not curable. It sometimes affects the eyes (ocular rosacea) as well. It may respond to topical and/or systemic medication and can flare with stress, sun exposure, alcohol, exercise and some foods.  Daily application of broad spectrum spf 30+ sunscreen to face is recommended to reduce flares. Chronic and persistent condition with duration or expected duration over one year. Condition is symptomatic / bothersome to patient. Not to goal.    Continue Doxycycline  100 mg 1 po qd, Continue Metronidazole  0.75% gel qd-bid,  Will call for refills  Doxycycline  should be taken with food to prevent nausea. Do not lay down for 30 minutes after taking. Be cautious with sun exposure and use good sun protection while on this medication. Pregnant women should not take this medication.    SEBORRHEIC DERMATITIS/ SEBOPSORIASIS / PSORIASIS  Exam:  Scaliness of scalp Chronic and persistent condition with duration or expected duration over one year. Condition is bothersome/symptomatic for patient. Currently flared. Seborrheic Dermatitis is a chronic persistent rash characterized by pinkness and scaling most commonly of the mid face but also can occur on the scalp (dandruff), ears; mid chest, mid back and groin.  It tends to be exacerbated by stress and cooler weather.  People who have neurologic disease may experience new onset or exacerbation of existing seborrheic dermatitis.  The condition is not curable but treatable and can be controlled.  Treatment Plan: Continue Clobetasol  solution qd 5 times per week to scalp prn  Avoid applyng to face, groin, and axilla. Use as directed. Long-term use can cause thinning of the skin.  Topical steroids (such as triamcinolone , fluocinolone, fluocinonide, mometasone, clobetasol , halobetasol, betamethasone , hydrocortisone ) can cause thinning and lightening of the skin if they are used for too long in  the same area. Your physician has selected the right strength medicine for your problem and area affected on the body. Please use your medication only as directed by your physician to prevent side effects.   EPIDERMAL INCLUSION CYST Exam: cyst with open comedone at left chest costal area  Benign-appearing. Exam most consistent with an epidermal inclusion cyst. Discussed that a cyst is a benign growth that can grow over time and sometimes get irritated or inflamed. Recommend observation if it is not bothersome. Discussed option of surgical excision to remove it if it is growing, symptomatic, or other changes noted. Please call for new or changing lesions so they can be evaluated.  HISTORY OF BASAL CELL CARCINOMA OF THE SKIN 09/09/2008 - right distal pretibial  09/05/2014 - left chin 03/04/2017 - left anterior deltoid  - No evidence of recurrence today - Recommend regular full body skin exams - Recommend daily broad spectrum sunscreen SPF 30+ to sun-exposed areas, reapply every 2 hours as needed.  - Call if any new or changing lesions are noted between office visits  HISTORY OF Breast cancer Left breast - 2012 - radiation  T1 N0 ER/PR positive HER 2 negative  - No lymphadenopathy  - No evidence of recurrence today - Recommend regular full body skin exams  ROSACEA   Related Medications doxycycline  (MONODOX ) 100 MG capsule Take 1 capsule by mouth daily with evening meal and drink for rosacea ACTINIC KERATOSIS (4) b/l hands x 4 (4) Actinic keratoses  are precancerous spots that appear secondary to cumulative UV radiation exposure/sun exposure over time. They are chronic with expected duration over 1 year. A portion of actinic keratoses will progress to squamous cell carcinoma of the skin. It is not possible to reliably predict which spots will progress to skin cancer and so treatment is recommended to prevent development of skin cancer.  Recommend daily broad spectrum sunscreen SPF 30+ to  sun-exposed areas, reapply every 2 hours as needed.  Recommend staying in the shade or wearing long sleeves, sun glasses (UVA+UVB protection) and wide brim hats (4-inch brim around the entire circumference of the hat). Call for new or changing lesions. Destruction of lesion - b/l hands x 4 (4) Complexity: simple   Destruction method: cryotherapy   Informed consent: discussed and consent obtained   Timeout:  patient name, date of birth, surgical site, and procedure verified Lesion destroyed using liquid nitrogen: Yes   Region frozen until ice ball extended beyond lesion: Yes   Outcome: patient tolerated procedure well with no complications   Post-procedure details: wound care instructions given   INFLAMED SEBORRHEIC KERATOSIS Neck - Posterior x 1 Symptomatic, irritating, patient would like treated. Destruction of lesion - Neck - Posterior x 1 Complexity: simple   Destruction method: cryotherapy   Informed consent: discussed and consent obtained   Timeout:  patient name, date of birth, surgical site, and procedure verified Lesion destroyed using liquid nitrogen: Yes   Region frozen until ice ball extended beyond lesion: Yes   Outcome: patient tolerated procedure well with no complications   Post-procedure details: wound care instructions given   PSORIASIS   Related Medications clobetasol  (TEMOVATE ) 0.05 % external solution APPLY SMALL AMOUNT TOPICALLY TO THE AFFECTED AREA ONCE DAILY UP TO 5 DAYS WEEKLY for psoriasis at scalp Return in about 1 year (around 07/07/2024) for TBSE.  IRandee Busing, CMA, am acting as scribe for Celine Collard, MD.   Documentation: I have reviewed the above documentation for accuracy and completeness, and I agree with the above.  Celine Collard, MD

## 2023-07-08 NOTE — Patient Instructions (Addendum)
 For rosacea   Continue doxycycline  100 mg capsule by mouth daily with food and drink Doxycycline  should be taken with food to prevent nausea. Do not lay down for 30 minutes after taking. Be cautious with sun exposure and use good sun protection while on this medication. Pregnant women should not take this medication.   Continue metronidazole  0.75 % gel - daily to twice daily as needed for rosacea   For psoriasis at scalp  Continue clobetasol  solution - topically 5 days per week as needed to scalp.    Avoid applying to face, groin, and axilla. Use as directed. Long-term use can cause thinning of the skin.   Topical steroids (such as triamcinolone , fluocinolone, fluocinonide, mometasone, clobetasol , halobetasol, betamethasone , hydrocortisone ) can cause thinning and lightening of the skin if they are used for too long in the same area. Your physician has selected the right strength medicine for your problem and area affected on the body. Please use your medication only as directed by your physician to prevent side effects.      Seborrheic Keratosis  What causes seborrheic keratoses? Seborrheic keratoses are harmless, common skin growths that first appear during adult life.  As time goes by, more growths appear.  Some people may develop a large number of them.  Seborrheic keratoses appear on both covered and uncovered body parts.  They are not caused by sunlight.  The tendency to develop seborrheic keratoses can be inherited.  They vary in color from skin-colored to gray, brown, or even black.  They can be either smooth or have a rough, warty surface.   Seborrheic keratoses are superficial and look as if they were stuck on the skin.  Under the microscope this type of keratosis looks like layers upon layers of skin.  That is why at times the top layer may seem to fall off, but the rest of the growth remains and re-grows.    Treatment Seborrheic keratoses do not need to be treated, but can  easily be removed in the office.  Seborrheic keratoses often cause symptoms when they rub on clothing or jewelry.  Lesions can be in the way of shaving.  If they become inflamed, they can cause itching, soreness, or burning.  Removal of a seborrheic keratosis can be accomplished by freezing, burning, or surgery. If any spot bleeds, scabs, or grows rapidly, please return to have it checked, as these can be an indication of a skin cancer.   Cryotherapy Aftercare  Wash gently with soap and water  everyday.   Apply Vaseline and Band-Aid daily until healed.   Actinic keratoses are precancerous spots that appear secondary to cumulative UV radiation exposure/sun exposure over time. They are chronic with expected duration over 1 year. A portion of actinic keratoses will progress to squamous cell carcinoma of the skin. It is not possible to reliably predict which spots will progress to skin cancer and so treatment is recommended to prevent development of skin cancer.  Recommend daily broad spectrum sunscreen SPF 30+ to sun-exposed areas, reapply every 2 hours as needed.  Recommend staying in the shade or wearing long sleeves, sun glasses (UVA+UVB protection) and wide brim hats (4-inch brim around the entire circumference of the hat). Call for new or changing lesions.     Melanoma ABCDEs  Melanoma is the most dangerous type of skin cancer, and is the leading cause of death from skin disease.  You are more likely to develop melanoma if you: Have light-colored skin, light-colored eyes, or red or  blond hair Spend a lot of time in the sun Tan regularly, either outdoors or in a tanning bed Have had blistering sunburns, especially during childhood Have a close family member who has had a melanoma Have atypical moles or large birthmarks  Early detection of melanoma is key since treatment is typically straightforward and cure rates are extremely high if we catch it early.   The first sign of melanoma is  often a change in a mole or a new dark spot.  The ABCDE system is a way of remembering the signs of melanoma.  A for asymmetry:  The two halves do not match. B for border:  The edges of the growth are irregular. C for color:  A mixture of colors are present instead of an even brown color. D for diameter:  Melanomas are usually (but not always) greater than 6mm - the size of a pencil eraser. E for evolution:  The spot keeps changing in size, shape, and color.  Please check your skin once per month between visits. You can use a small mirror in front and a large mirror behind you to keep an eye on the back side or your body.   If you see any new or changing lesions before your next follow-up, please call to schedule a visit.  Please continue daily skin protection including broad spectrum sunscreen SPF 30+ to sun-exposed areas, reapplying every 2 hours as needed when you're outdoors.   Staying in the shade or wearing long sleeves, sun glasses (UVA+UVB protection) and wide brim hats (4-inch brim around the entire circumference of the hat) are also recommended for sun protection.    Due to recent changes in healthcare laws, you may see results of your pathology and/or laboratory studies on MyChart before the doctors have had a chance to review them. We understand that in some cases there may be results that are confusing or concerning to you. Please understand that not all results are received at the same time and often the doctors may need to interpret multiple results in order to provide you with the best plan of care or course of treatment. Therefore, we ask that you please give us  2 business days to thoroughly review all your results before contacting the office for clarification. Should we see a critical lab result, you will be contacted sooner.   If You Need Anything After Your Visit  If you have any questions or concerns for your doctor, please call our main line at [phone] and press  option 4 to reach your doctor's medical assistant. If no one answers, please leave a voicemail as directed and we will return your call as soon as possible. Messages left after 4 pm will be answered the following business day.   You may also send us  a message via MyChart. We typically respond to MyChart messages within 1-2 business days.  For prescription refills, please ask your pharmacy to contact our office. Our fax number is [phone].  If you have an urgent issue when the clinic is closed that cannot wait until the next business day, you can page your doctor at the number below.    Please note that while we do our best to be available for urgent issues outside of office hours, we are not available 24/7.   If you have an urgent issue and are unable to reach us , you may choose to seek medical care at your doctor's office, retail clinic, urgent care center, or emergency room.  If  you have a medical emergency, please immediately call 911 or go to the emergency department.  Pager Numbers  - Dr. Bary Likes: [phone]  - Dr. Annette Barters: [phone]  - Dr. Felipe Horton: [phone]   In the event of inclement weather, please call our main line at [phone] for an update on the status of any delays or closures.  Dermatology Medication Tips: Please keep the boxes that topical medications come in in order to help keep track of the instructions about where and how to use these. Pharmacies typically print the medication instructions only on the boxes and not directly on the medication tubes.   If your medication is too expensive, please contact our office at [phone] option 4 or send us  a message through MyChart.   We are unable to tell what your co-pay for medications will be in advance as this is different depending on your insurance coverage. However, we may be able to find a substitute medication at lower cost or fill out paperwork to get insurance to cover a needed medication.   If a  prior authorization is required to get your medication covered by your insurance company, please allow us  1-2 business days to complete this process.  Drug prices often vary depending on where the prescription is filled and some pharmacies may offer cheaper prices.  The website www.goodrx.com contains coupons for medications through different pharmacies. The prices here do not account for what the cost may be with help from insurance (it may be cheaper with your insurance), but the website can give you the price if you did not use any insurance.  - You can print the associated coupon and take it with your prescription to the pharmacy.  - You may also stop by our office during regular business hours and pick up a GoodRx coupon card.  - If you need your prescription sent electronically to a different pharmacy, notify our office through [facility] or by phone at [phone] option 4.     Si Usted Necesita Algo Despus de Su Visita  Tambin puede enviarnos un mensaje a travs de Clinical cytogeneticist. Por lo general respondemos a los mensajes de MyChart en el transcurso de 1 a 2 das hbiles.  Para renovar recetas, por favor pida a su farmacia que se ponga en contacto con nuestra oficina. Franz Jacks de fax es [place] [phone].  Si tiene un asunto urgente cuando la clnica est cerrada y que no puede esperar hasta el siguiente da hbil, puede llamar/localizar a su doctor(a) al nmero que aparece a continuacin.   Por favor, tenga en cuenta que aunque hacemos todo lo posible para estar disponibles para asuntos urgentes fuera del horario de [place], no estamos disponibles las 24 horas del da, los 7 [address]  [address] de la [place].   Si tiene un problema urgente y no puede comunicarse con nosotros, puede optar por buscar atencin mdica  en el consultorio de su doctor(a), en una clnica privada, en un centro de atencin urgente o en una sala de emergencias.  Si tiene Engineer, drilling, por favor llame  inmediatamente al 911 o vaya a la sala de emergencias.  Nmeros de bper  - Dr. Bary Likes: [phone]  - Dra. Annette Barters: [phone]  - Dr. Felipe Horton: [phone]   En caso de inclemencias del tiempo, por favor llame a Lajuan Pila principal al [phone] para una actualizacin sobre el [place] de cualquier retraso o cierre.  Consejos para la medicacin en dermatologa: Por favor, guarde las cajas en las que vienen los medicamentos  de uso tpico para ayudarle a seguir las [employer] dnde y cmo usarlos. Las farmacias generalmente imprimen las instrucciones del medicamento slo en las cajas y no directamente en los tubos del [place].   Si su medicamento es muy caro, por favor, pngase en contacto con Bettyjane Brunet llamando al [phone] y presione la opcin 4 o envenos un mensaje a travs de Clinical cytogeneticist.   No podemos decirle cul ser su copago por los medicamentos por adelantado ya que esto es diferente dependiendo de la cobertura de su seguro. Sin embargo, es posible que podamos encontrar un medicamento sustituto a Audiological scientist un formulario para que el seguro cubra el medicamento que se considera necesario.   Si se requiere una autorizacin previa para que su compaa de seguros [place] su medicamento, por favor permtanos de 1 a 2 das hbiles para completar este proceso.  Los precios de los medicamentos varan con frecuencia dependiendo del Environmental consultant de dnde se surte la receta y alguna farmacias pueden ofrecer precios ms baratos.  El sitio web www.goodrx.com tiene cupones para medicamentos de Health and safety inspector. Los precios aqu no tienen en cuenta lo que podra costar con la ayuda del seguro (puede ser ms barato con su seguro), pero el sitio web puede darle el precio si no utiliz Tourist information centre manager.  - Puede imprimir el cupn correspondiente y llevarlo con su receta a la farmacia.  - Tambin puede pasar por nuestra oficina durante el horario de atencin regular y  Education officer, museum una tarjeta de cupones de GoodRx.  - Si necesita que su receta se enve electrnicamente a una farmacia diferente, informe a nuestra oficina a travs de MyChart de [place] o por telfono llamando al [phone] y presione la opcin 4.

## 2023-07-12 ENCOUNTER — Telehealth: Payer: Self-pay

## 2023-07-12 DIAGNOSIS — Z79899 Other long term (current) drug therapy: Secondary | ICD-10-CM | POA: Diagnosis not present

## 2023-07-12 NOTE — Telephone Encounter (Signed)
 [employer] nail fungal ID results in media for review. aw

## 2023-07-13 ENCOUNTER — Other Ambulatory Visit
Admission: RE | Admit: 2023-07-13 | Discharge: 2023-07-13 | Disposition: A | Discharge status: Home / Self Care | Attending: Nurse Practitioner | Admitting: Nurse Practitioner

## 2023-07-13 ENCOUNTER — Ambulatory Visit: Payer: Self-pay | Admitting: Nurse Practitioner

## 2023-07-13 DIAGNOSIS — Z79899 Other long term (current) drug therapy: Secondary | ICD-10-CM

## 2023-07-13 LAB — BASIC METABOLIC PANEL WITH GFR
Anion gap: 8 (ref 5–15)
BUN/Creatinine Ratio: 24 (ref 12–28)
BUN: 25 mg/dL (ref 8–27)
BUN: 25 mg/dL — ABNORMAL HIGH (ref 8–23)
CO2: 22 mmol/L (ref 20–29)
CO2: 24 mmol/L (ref 22–32)
Calcium: 10 mg/dL (ref 8.7–10.3)
Calcium: 9.5 mg/dL (ref 8.9–10.3)
Chloride: 105 mmol/L (ref 96–106)
Chloride: 107 mmol/L (ref 98–111)
Creatinine, Ser: 1.04 mg/dL — ABNORMAL HIGH (ref 0.57–1.00)
Creatinine, Ser: 1.04 mg/dL — ABNORMAL HIGH (ref 0.44–1.00)
GFR, Estimated: 52 mL/min — ABNORMAL LOW (ref >60)
Glucose, Bld: 93 mg/dL (ref 70–99)
Glucose: 161 mg/dL — ABNORMAL HIGH (ref 70–99)
Potassium: 5.4 mmol/L — ABNORMAL HIGH (ref 3.5–5.2)
Potassium: 4.6 mmol/L (ref 3.5–5.1)
Sodium: 141 mmol/L (ref 134–144)
Sodium: 139 mmol/L (ref 135–145)
eGFR: 52 mL/min/{1.73_m2} — ABNORMAL LOW (ref >59)

## 2023-07-13 NOTE — Telephone Encounter (Signed)
 Patient's husband Myrtie Atkinson, on [place], advised of results. aw

## 2023-07-15 DIAGNOSIS — H6123 Impacted cerumen, bilateral: Secondary | ICD-10-CM | POA: Diagnosis not present

## 2023-07-15 DIAGNOSIS — H903 Sensorineural hearing loss, bilateral: Secondary | ICD-10-CM | POA: Diagnosis not present

## 2023-08-04 ENCOUNTER — Other Ambulatory Visit
Admission: RE | Admit: 2023-08-04 | Discharge: 2023-08-04 | Disposition: A | Discharge status: Home / Self Care | Source: Ambulatory Visit | Attending: Nurse Practitioner | Admitting: Nurse Practitioner

## 2023-08-04 ENCOUNTER — Ambulatory Visit: Payer: Self-pay | Admitting: Nurse Practitioner

## 2023-08-04 DIAGNOSIS — Z79899 Other long term (current) drug therapy: Secondary | ICD-10-CM | POA: Diagnosis not present

## 2023-08-04 LAB — BASIC METABOLIC PANEL WITH GFR
Anion gap: 8 (ref 5–15)
BUN: 24 mg/dL — ABNORMAL HIGH (ref 8–23)
CO2: 25 mmol/L (ref 22–32)
Calcium: 9.6 mg/dL (ref 8.9–10.3)
Chloride: 105 mmol/L (ref 98–111)
Creatinine, Ser: 0.99 mg/dL (ref 0.44–1.00)
GFR, Estimated: 55 mL/min — ABNORMAL LOW (ref >60)
Glucose, Bld: 106 mg/dL — ABNORMAL HIGH (ref 70–99)
Potassium: 4.8 mmol/L (ref 3.5–5.1)
Sodium: 138 mmol/L (ref 135–145)

## 2023-08-11 ENCOUNTER — Ambulatory Visit: Attending: Nurse Practitioner | Admitting: Nurse Practitioner

## 2023-08-11 ENCOUNTER — Encounter: Payer: Self-pay | Admitting: Nurse Practitioner

## 2023-08-11 VITALS — BP 130/58 | HR 57 | Ht 64.0 in | Wt 135.8 lb

## 2023-08-11 DIAGNOSIS — I10 Essential (primary) hypertension: Secondary | ICD-10-CM | POA: Diagnosis not present

## 2023-08-11 DIAGNOSIS — I493 Ventricular premature depolarization: Secondary | ICD-10-CM | POA: Diagnosis not present

## 2023-08-11 DIAGNOSIS — E782 Mixed hyperlipidemia: Secondary | ICD-10-CM

## 2023-08-11 DIAGNOSIS — N1831 Chronic kidney disease, stage 3a: Secondary | ICD-10-CM

## 2023-08-11 DIAGNOSIS — Z79899 Other long term (current) drug therapy: Secondary | ICD-10-CM
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
## 2023-08-11 NOTE — Patient Instructions (Signed)
 Medication Instructions:   Your physician recommends that you continue on your current medications as directed. Please refer to the Current Medication list given to you today.   *If you need a refill on your cardiac medications before your next appointment, please call your pharmacy*  Lab Work:  Your provider would like for you to return in 4 Weeks to have the following labs drawn: BMET.   [facility] No Appointment Necessary   Testing/Procedures:  No test ordered today   Follow-Up: At [facility], you and your health needs are our priority.  As part of our continuing mission to provide you with exceptional heart care, our providers are all part of one team.  This team includes your primary Cardiologist (physician) and Advanced Practice Providers or APPs (Physician Assistants and Nurse Practitioners) who all work together to provide you with the care you need, when you need it.  Your next appointment:   3 month(s)  Provider:   Antionette Kirks, MD or Laneta Pintos, NP

## 2023-09-07 ENCOUNTER — Ambulatory Visit: Payer: Self-pay | Admitting: Nurse Practitioner

## 2023-09-07 ENCOUNTER — Other Ambulatory Visit
Admission: RE | Admit: 2023-09-07 | Discharge: 2023-09-07 | Disposition: A | Discharge status: Home / Self Care | Attending: Nurse Practitioner | Admitting: Nurse Practitioner

## 2023-09-07 DIAGNOSIS — Z79899 Other long term (current) drug therapy: Secondary | ICD-10-CM | POA: Insufficient documentation

## 2023-09-07 LAB — BASIC METABOLIC PANEL WITH GFR
Anion gap: 9 (ref 5–15)
BUN: 29 mg/dL — ABNORMAL HIGH (ref 8–23)
CO2: 23 mmol/L (ref 22–32)
Calcium: 9.8 mg/dL (ref 8.9–10.3)
Chloride: 106 mmol/L (ref 98–111)
Creatinine, Ser: 1.06 mg/dL — ABNORMAL HIGH (ref 0.44–1.00)
GFR, Estimated: 51 mL/min — ABNORMAL LOW (ref >60)
Glucose, Bld: 106 mg/dL — ABNORMAL HIGH (ref 70–99)
Potassium: 4.6 mmol/L (ref 3.5–5.1)
Sodium: 138 mmol/L (ref 135–145)

## 2023-09-27 ENCOUNTER — Telehealth: Payer: Self-pay | Admitting: Nurse Practitioner

## 2023-09-27 NOTE — Telephone Encounter (Signed)
 Pt c/o BP issue: STAT if pt c/o blurred vision, one-sided weakness or slurred speech.  STAT if BP is GREATER than 180/120 TODAY.  STAT if BP is LESS than 90/60 and SYMPTOMATIC TODAY  1. What is your BP concern? Pt concerned bp is low and she is dizzy when it gets low   2. Have you taken any BP medication today? Yes   3. What are your last 5 BP readings?  105/58 101/67 101/61 137/70 143/64  4. Are you having any other symptoms (ex. Dizziness, headache, blurred vision, passed out)? Dizziness

## 2023-09-27 NOTE — Telephone Encounter (Signed)
 Called patient. Patient states that once her blood pressure gets below 115 or bottom number < 60, she starts to feel dizzy. Denies losing consciousness. States she has been dizzy almost everyday for a week now and thinks it may be due to the spironolactone , claims she doesn't have any issues with the other medication. Told patient to keep taking her medications as prescribed and and wait for someone to get back with her for follow up. Patient verbalized understanding.

## 2023-09-28 ENCOUNTER — Other Ambulatory Visit (HOSPITAL_COMMUNITY): Payer: Self-pay

## 2023-09-28 MED ORDER — SPIRONOLACTONE 25 MG PO TABS
12.5000 mg | ORAL_TABLET | Freq: Every day | ORAL | 3 refills | Status: DC
  Filled 2023-09-28: qty 30, 60d supply, fill #0

## 2023-09-28 MED ORDER — SPIRONOLACTONE 25 MG PO TABS: 12.5000 mg | ORAL_TABLET | Freq: Every day | ORAL | 3 refills | Status: DC

## 2023-09-28 NOTE — Telephone Encounter (Signed)
 Spoke with patient and reviewed provider recommendations. She verbalized understanding and reports that she is about to run out of the medication. Advised that I will send in updated prescription and to let us  know if she has any further concerns. She was agreeable with plan and had no further questions.

## 2023-09-28 NOTE — Telephone Encounter (Signed)
Decrease spironolactone to 12.5 mg once daily.

## 2023-09-28 NOTE — Addendum Note (Signed)
 Addended by: DASIE SHARLET RAMAN on: 09/28/2023 11:39 AM   Modules accepted: Orders

## 2023-09-28 NOTE — Telephone Encounter (Signed)
Left voicemail message to call back for review of recommendations.  

## 2023-09-29 DIAGNOSIS — E119 Type 2 diabetes mellitus without complications: Secondary | ICD-10-CM | POA: Diagnosis not present

## 2023-09-29 DIAGNOSIS — D3131 Benign neoplasm of right choroid: Secondary | ICD-10-CM | POA: Diagnosis not present

## 2023-09-29 DIAGNOSIS — Z961 Presence of intraocular lens: Secondary | ICD-10-CM | POA: Diagnosis not present

## 2023-09-29 DIAGNOSIS — H353131 Nonexudative age-related macular degeneration, bilateral, early dry stage: Secondary | ICD-10-CM | POA: Diagnosis not present

## 2023-09-29 LAB — HM DIABETES EYE EXAM

## 2023-10-06 ENCOUNTER — Other Ambulatory Visit (HOSPITAL_COMMUNITY): Payer: Self-pay

## 2023-10-20 DIAGNOSIS — L239 Allergic contact dermatitis, unspecified cause: Secondary | ICD-10-CM | POA: Diagnosis not present

## 2023-10-20 DIAGNOSIS — H353131 Nonexudative age-related macular degeneration, bilateral, early dry stage: Secondary | ICD-10-CM | POA: Diagnosis not present

## 2023-10-28 ENCOUNTER — Other Ambulatory Visit: Payer: Self-pay | Admitting: General Surgery

## 2023-10-28 DIAGNOSIS — Z1231 Encounter for screening mammogram for malignant neoplasm of breast: Secondary | ICD-10-CM

## 2023-11-12 DIAGNOSIS — C50212 Malignant neoplasm of upper-inner quadrant of left female breast: Secondary | ICD-10-CM | POA: Diagnosis not present

## 2023-11-12 DIAGNOSIS — Z170 Estrogen receptor positive status [ER+]: Secondary | ICD-10-CM | POA: Diagnosis not present

## 2023-11-22 DIAGNOSIS — M1711 Unilateral primary osteoarthritis, right knee: Secondary | ICD-10-CM | POA: Diagnosis not present

## 2023-11-24 ENCOUNTER — Ambulatory Visit: Payer: PPO

## 2023-11-24 VITALS — Ht 64.0 in | Wt 135.0 lb

## 2023-11-24 DIAGNOSIS — Z0000 Encounter for general adult medical examination without abnormal findings: Secondary | ICD-10-CM

## 2023-11-24 NOTE — Patient Instructions (Signed)
 Ms. Brossart,  Thank you for taking the time for your Medicare Wellness Visit. I appreciate your continued commitment to your health goals. Please review the care plan we discussed, and feel free to reach out if I can assist you further.  Medicare recommends these wellness visits once per year to help you and your care team stay ahead of potential health issues. These visits are designed to focus on prevention, allowing your provider to concentrate on managing your acute and chronic conditions during your regular appointments.  Please note that Annual Wellness Visits do not include a physical exam. Some assessments may be limited, especially if the visit was conducted virtually. If needed, we may recommend a separate in-person follow-up with your provider.  Ongoing Care Seeing your primary care provider every 3 to 6 months helps us  monitor your health and provide consistent, personalized care.   Referrals If a referral was made during today's visit and you haven't received any updates within two weeks, please contact the referred provider directly to check on the status.  Recommended Screenings:  Health Maintenance  Topic Date Due   Zoster (Shingles) Vaccine (1 of 2) 05/02/1952   Flu Shot  10/01/2023   COVID-19 Vaccine (5 - 2025-26 season) 11/01/2023   Medicare Annual Wellness Visit  11/17/2023   Breast Cancer Screening  12/11/2023   DTaP/Tdap/Td vaccine (2 - Tdap) 07/28/2025*   Pneumococcal Vaccine for age over 29  Completed   DEXA scan (bone density measurement)  Completed   HPV Vaccine  Aged Out   Meningitis B Vaccine  Aged Out  *Topic was postponed. The date shown is not the original due date.       06/30/2023    9:29 AM  Advanced Directives  Does Patient Have a Medical Advance Directive? No  Would patient like information on creating a medical advance directive? No - Patient declined   Advance Care Planning is important because it: Ensures you receive medical care that aligns  with your values, goals, and preferences. Provides guidance to your family and loved ones, reducing the emotional burden of decision-making during critical moments.  Vision: Annual vision screenings are recommended for early detection of glaucoma, cataracts, and diabetic retinopathy. These exams can also reveal signs of chronic conditions such as diabetes and high blood pressure.  Dental: Annual dental screenings help detect early signs of oral cancer, gum disease, and other conditions linked to overall health, including heart disease and diabetes.  Please see the attached documents for additional preventive care recommendations.
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
## 2023-11-25 ENCOUNTER — Ambulatory Visit: Attending: Cardiovascular Disease | Admitting: Cardiovascular Disease

## 2023-11-25 ENCOUNTER — Encounter: Payer: Self-pay | Admitting: Cardiovascular Disease

## 2023-11-25 VITALS — BP 158/90 | HR 68 | Ht 64.0 in | Wt 135.5 lb

## 2023-11-25 DIAGNOSIS — R0989 Other specified symptoms and signs involving the circulatory and respiratory systems: Secondary | ICD-10-CM

## 2023-11-25 DIAGNOSIS — E785 Hyperlipidemia, unspecified: Secondary | ICD-10-CM | POA: Diagnosis not present

## 2023-11-25 DIAGNOSIS — I10 Essential (primary) hypertension: Secondary | ICD-10-CM

## 2023-11-25 DIAGNOSIS — I493 Ventricular premature depolarization: Secondary | ICD-10-CM
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
## 2023-11-25 NOTE — Patient Instructions (Signed)

## 2023-11-29 DIAGNOSIS — M1711 Unilateral primary osteoarthritis, right knee: Secondary | ICD-10-CM | POA: Diagnosis not present

## 2023-12-03 ENCOUNTER — Telehealth: Payer: Self-pay | Admitting: Cardiovascular Disease

## 2023-12-03 MED ORDER — OLMESARTAN MEDOXOMIL 40 MG PO TABS: 40.0000 mg | ORAL_TABLET | Freq: Every day | ORAL | 3 refills | Status: AC

## 2023-12-03 NOTE — Telephone Encounter (Signed)
*  STAT* If patient is at the pharmacy, call can be transferred to refill team.   1. Which medications need to be refilled? (please list name of each medication and dose if known)   olmesartan  (BENICAR ) 40 MG tablet   2. Would you like to learn more about the convenience, safety, & potential cost savings by using the [facility]?   3. Are you open to using the [facility] (Type [facility]. ).  4. Which pharmacy/location (including street and city if local pharmacy) is medication to be sent to?  [id number] - [place], [place] - [address]   5. Do they need a 30 day or 90 day supply? 90 day  Patient stated she is almost out of this medication.

## 2023-12-03 NOTE — Telephone Encounter (Signed)
 RX sent in

## 2023-12-06 DIAGNOSIS — M1711 Unilateral primary osteoarthritis, right knee: Secondary | ICD-10-CM | POA: Diagnosis not present

## 2023-12-14 ENCOUNTER — Ambulatory Visit
Admission: RE | Admit: 2023-12-14 | Discharge: 2023-12-14 | Disposition: A | Discharge status: Home / Self Care | Source: Ambulatory Visit | Attending: General Surgery | Admitting: General Surgery

## 2023-12-14 DIAGNOSIS — Z1231 Encounter for screening mammogram for malignant neoplasm of breast: Secondary | ICD-10-CM | POA: Insufficient documentation

## 2024-01-13 DIAGNOSIS — H903 Sensorineural hearing loss, bilateral: Secondary | ICD-10-CM | POA: Diagnosis not present

## 2024-01-25 ENCOUNTER — Ambulatory Visit (INDEPENDENT_AMBULATORY_CARE_PROVIDER_SITE_OTHER): Admitting: Family Medicine

## 2024-01-25 ENCOUNTER — Encounter: Payer: Self-pay | Admitting: Family Medicine

## 2024-01-25 ENCOUNTER — Telehealth: Payer: Self-pay

## 2024-01-25 VITALS — BP 130/62 | HR 56 | Temp 97.8°F | Ht 60.0 in | Wt 136.1 lb

## 2024-01-25 DIAGNOSIS — R7303 Prediabetes: Secondary | ICD-10-CM | POA: Diagnosis not present

## 2024-01-25 DIAGNOSIS — M85832 Other specified disorders of bone density and structure, left forearm: Secondary | ICD-10-CM

## 2024-01-25 DIAGNOSIS — N1831 Chronic kidney disease, stage 3a: Secondary | ICD-10-CM

## 2024-01-25 DIAGNOSIS — K5909 Other constipation: Secondary | ICD-10-CM | POA: Diagnosis not present

## 2024-01-25 DIAGNOSIS — I10 Essential (primary) hypertension: Secondary | ICD-10-CM | POA: Diagnosis not present

## 2024-01-25 DIAGNOSIS — Z853 Personal history of malignant neoplasm of breast: Secondary | ICD-10-CM

## 2024-01-25 DIAGNOSIS — C50212 Malignant neoplasm of upper-inner quadrant of left female breast: Secondary | ICD-10-CM

## 2024-01-25 DIAGNOSIS — Z170 Estrogen receptor positive status [ER+]: Secondary | ICD-10-CM

## 2024-01-25 DIAGNOSIS — E559 Vitamin D deficiency, unspecified: Secondary | ICD-10-CM

## 2024-01-25 DIAGNOSIS — E785 Hyperlipidemia, unspecified: Secondary | ICD-10-CM

## 2024-01-25 DIAGNOSIS — Z0000 Encounter for general adult medical examination without abnormal findings: Secondary | ICD-10-CM

## 2024-01-25 DIAGNOSIS — Z7189 Other specified counseling: Secondary | ICD-10-CM

## 2024-01-25 DIAGNOSIS — Z66 Do not resuscitate: Secondary | ICD-10-CM

## 2024-01-25 DIAGNOSIS — I340 Nonrheumatic mitral (valve) insufficiency: Secondary | ICD-10-CM

## 2024-01-25 LAB — CBC WITH DIFFERENTIAL/PLATELET
Basophils Absolute: 0.1 K/uL (ref 0.0–0.1)
Basophils Relative: 1.4 % (ref 0.0–3.0)
Eosinophils Absolute: 0.3 K/uL (ref 0.0–0.7)
Eosinophils Relative: 3.4 % (ref 0.0–5.0)
HCT: 40.7 % (ref 36.0–46.0)
Hemoglobin: 13.1 g/dL (ref 12.0–15.0)
Lymphocytes Relative: 25.6 % (ref 12.0–46.0)
Lymphs Abs: 2.2 K/uL (ref 0.7–4.0)
MCHC: 32.3 g/dL (ref 30.0–36.0)
MCV: 96 fl (ref 78.0–100.0)
Monocytes Absolute: 0.8 K/uL (ref 0.1–1.0)
Monocytes Relative: 9.5 % (ref 3.0–12.0)
Neutro Abs: 5.1 K/uL (ref 1.4–7.7)
Neutrophils Relative %: 60.1 % (ref 43.0–77.0)
Platelets: 230 K/uL (ref 150.0–400.0)
RBC: 4.24 Mil/uL (ref 3.87–5.11)
RDW: 13.1 % (ref 11.5–15.5)
WBC: 8.4 K/uL (ref 4.0–10.5)

## 2024-01-25 LAB — COMPREHENSIVE METABOLIC PANEL WITH GFR
ALT: 18 U/L (ref 0–35)
AST: 20 U/L (ref 0–37)
Albumin: 4.5 g/dL (ref 3.5–5.2)
Alkaline Phosphatase: 82 U/L (ref 39–117)
BUN: 27 mg/dL — ABNORMAL HIGH (ref 6–23)
CO2: 31 meq/L (ref 19–32)
Calcium: 10.7 mg/dL — ABNORMAL HIGH (ref 8.4–10.5)
Chloride: 104 meq/L (ref 96–112)
Creatinine, Ser: 1.12 mg/dL (ref 0.40–1.20)
GFR: 43.83 mL/min — ABNORMAL LOW (ref >60.00)
Glucose, Bld: 101 mg/dL — ABNORMAL HIGH (ref 70–99)
Potassium: 6 meq/L (ref 3.5–5.1)
Sodium: 140 meq/L (ref 135–145)
Total Bilirubin: 0.5 mg/dL (ref 0.2–1.2)
Total Protein: 7.1 g/dL (ref 6.0–8.3)

## 2024-01-25 LAB — LIPID PANEL
Cholesterol: 154 mg/dL (ref 0–200)
HDL: 51.2 mg/dL (ref >39.00)
LDL Cholesterol: 71 mg/dL (ref 0–99)
NonHDL: 102.89
Total CHOL/HDL Ratio: 3
Triglycerides: 159 mg/dL — ABNORMAL HIGH (ref 0.0–149.0)
VLDL: 31.8 mg/dL (ref 0.0–40.0)

## 2024-01-25 LAB — VITAMIN D 25 HYDROXY (VIT D DEFICIENCY, FRACTURES): VITD: 52.6 ng/mL (ref 30.00–100.00)

## 2024-01-25 LAB — PHOSPHORUS: Phosphorus: 4 mg/dL (ref 2.3–4.6)

## 2024-01-25 LAB — HEMOGLOBIN A1C: Hgb A1c MFr Bld: 6.2 % (ref 4.6–6.5)

## 2024-01-25 MED ORDER — LUBIPROSTONE 24 MCG PO CAPS: 24.0000 ug | ORAL_CAPSULE | Freq: Two times a day (BID) | ORAL | 3 refills | Status: AC

## 2024-01-25 MED ORDER — LOVASTATIN 40 MG PO TABS: 40.0000 mg | ORAL_TABLET | Freq: Every day | ORAL | 3 refills | Status: AC

## 2024-01-25 NOTE — Assessment & Plan Note (Signed)
 confirmed

## 2024-01-25 NOTE — Telephone Encounter (Signed)
 Left message to return call to office. Ok to [employer] below.

## 2024-01-25 NOTE — Assessment & Plan Note (Signed)
 Continue vit D 1000 international units daily. Update levels.

## 2024-01-25 NOTE — Assessment & Plan Note (Signed)
 Brings BP readings overall well controlled over the past 1-2 months - continue current regimen.

## 2024-01-25 NOTE — Assessment & Plan Note (Signed)
 Continue yearly R screening mammogram.

## 2024-01-25 NOTE — Assessment & Plan Note (Signed)
 Reviewed latest DEXA. Continue calcium  and vit D intake. Osteopenia but at increased fracture risk based on FRAX.  She declines osteoporosis medication at this time.

## 2024-01-25 NOTE — Telephone Encounter (Signed)
 Please notify patient - potassium returned elevated- recommend she stop spironolactone  at this time.  Recommend return next week to recheck potassium levels.  Awaiting other labwork.

## 2024-01-25 NOTE — Patient Instructions (Addendum)
 Labs today  Try amitiza  24mcg once to twice daily for constipation Good to see you today Return as needed or in 1 year for next physical

## 2024-01-25 NOTE — Assessment & Plan Note (Signed)
 Chronic, stable period on lovastatin  - continue. The ASCVD Risk score (Arnett DK, et al., 2019) failed to calculate for the following reasons:   The 2019 ASCVD risk score is only valid for ages 50 to 54

## 2024-01-25 NOTE — Assessment & Plan Note (Signed)
 CIC - no h/o bowel obstruction. Failed regular use of stool softener, probiotic, miralax , mag citrate. Rx amitiza  24mcg daily to BID dosing - update with effect.

## 2024-01-25 NOTE — Assessment & Plan Note (Signed)
 Preventative protocols reviewed and updated unless pt declined. Discussed healthy diet and lifestyle.

## 2024-01-25 NOTE — Assessment & Plan Note (Signed)
Chronic, mild, stable.  

## 2024-01-25 NOTE — Telephone Encounter (Signed)
  CRITICAL VALUE: K 6.0  RECEIVER (on-site recipient of call):Santo Zahradnik LPN  DATE & TIME NOTIFIED: 4:20 PM 01/25/24   MESSENGER (representative from lab):Dyjwpvlj [facility]  MD NOTIFIED: Dr. Anton Blas  TIME OF NOTIFICATION:4:20 PM  RESPONSE:

## 2024-01-25 NOTE — Assessment & Plan Note (Signed)
 Chronic, update renal function labs

## 2024-01-25 NOTE — Assessment & Plan Note (Signed)
 Previously reviewed.

## 2024-01-25 NOTE — Assessment & Plan Note (Signed)
 Udpate A1c.

## 2024-01-25 NOTE — Assessment & Plan Note (Addendum)
 Continue yearly R screening mammogram, s/p L mastectomy

## 2024-01-25 NOTE — Progress Notes (Signed)
 Ph: [phone] Fax: [phone]   Patient ID: Paige Medina, female    DOB: 1933-11-07, 89 y.o.   MRN: [id number]  This visit was conducted in person.  BP 130/62   Pulse (!) 56   Temp 97.8 F (36.6 C) (Oral)   Ht 5' (1.524 m)   Wt 136 lb 2 oz (61.7 kg)   SpO2 94%   BMI 26.59 kg/m    CC: CPE Subjective:   HPI: Paige Medina is a 89 y.o. female presenting on 01/25/2024 for Annual Exam (Pt is accompanied by her husband Paige Medina)   Saw health advisor 11/2023 for medicare wellness visit. Note reviewed.   No results found.  Flowsheet Row Office Visit from 01/25/2024 in [facility]  PHQ-2 Total Score 0       01/25/2024   12:03 PM 11/17/2023   10:47 AM 06/28/2023   10:23 AM 05/25/2023    9:09 AM 11/18/2022   10:40 AM  Fall Risk   Falls in the past year? 0 0 0 1 1  Number falls in past yr: 0 0 0 0 0  Injury with Fall? 0 0 0 0   Risk for fall due to :  Orthopedic patient No Fall Risks    Follow up Falls evaluation completed Education provided;Falls prevention discussed   Falls evaluation completed;Falls prevention discussed    Lives with husband at [facility].   She sees ortho and recently has undergone gel shots into R knee.  No longer going to the gym due to worsening OA  White coat hypertension - brings BP log sheet reviewed as per below:     Preventative:   COLONOSCOPY 12/2014 mod diverticulosis o/w WNL, f/u prn O[facility]ta). No blood in stool or bowel changes since.  Well woman - s/p hysterectomy 1977, ovaries remain. Denies sxs.  Mammogram 12/2023 [facility] @ [place].  DEXA 01/2018 - T -1.3 LFN, -2.2 L forearm, hip fracture risk 3.3% (elevated)  DEXA 11/2021 - T -2.1 L forearm, at increased hip fracture risk (3.9%)  She continues calcium  and vit D. Declines bone strengthening medication.  Lung cancer screening - not eligible  Flu yearly  COVID vaccine - Moderna 03/2019, 04/2019, booster 12/2019, 06/2020, 10/202, 10/2021, 06/2023   Pneumovax 06/2005. Prevnar-13 2015. Prevnar-20 - declines Td 06/2005  RSV - 12/2021 Shingrix - discussed, not interested.  Advanced directives: Scanned into chart 10/2014. Paige Medina husband then Paige Medina and Paige Medina are [facility]. Does not want prolonged life support if terminal or irreversible condition. Confirmed DNR status.  Seat belt use discussed.  Sunscreen use discussed. No changing moles on skin.  Sleep - averages 7-8 hours Non smoker  Alcohol - rare  Dentist Q6 mo  Eye exam - yearly  Bladder - no incontinence  Bowel - chronic constipation managed with daily stool softener and pre/probiotic, occasional miralax , magnesium  citrate    Lives with husband, no pets   Grown children   Occupation: retired, varied jobs investment banker, corporate   Activity: gym 3d/wk, active mentally with crosswords   Diet: good water , fruits/vegetables daily      Relevant past medical, surgical, family and social history reviewed and updated as indicated. Interim medical history since our last visit reviewed. Allergies and medications reviewed and updated. Outpatient Medications Prior to Visit  Medication Sig Dispense Refill   Ascorbic Acid  (VITAMIN C ) 1000 MG tablet Take 1,000 mg by mouth daily.     beta carotene w/minerals (OCUVITE) tablet Take 1 tablet by  mouth daily.     Blood Glucose Monitoring Suppl (ONE TOUCH ULTRA 2) w/Device KIT Use as directed 1 each 0   Boswellia-Glucosamine-Vit D (OSTEO BI-FLEX ONE PER DAY PO) Take 1 tablet by mouth 2 (two) times daily.     Calcium  Carb-Cholecalciferol  (CALTRATE 600+D3) 600-20 MG-MCG TABS Take 1 tablet by mouth daily.     Cholecalciferol  (VITAMIN D3) 25 MCG (1000 UT) CAPS Take 1 capsule (1,000 Units total) by mouth daily.     clobetasol  (TEMOVATE ) 0.05 % external solution APPLY SMALL AMOUNT TOPICALLY TO THE AFFECTED AREA ONCE DAILY UP TO 5 DAYS WEEKLY for psoriasis at scalp 50 mL 3   Clobetasol  Prop Emollient Base 0.05 % emollient cream Apply topically as needed (ROSCEA).      Coenzyme Q10 (CO Q-10) 200 MG CAPS Take 400 mg by mouth every morning.     cyanocobalamin (VITAMIN B12) 1000 MCG tablet Take 1,000 mcg by mouth every other day.     docusate sodium  (COLACE) 100 MG capsule Take 100-200 mg by mouth 2 (two) times daily. 100 mg am, 200 mg hs     doxycycline  (MONODOX ) 100 MG capsule Take 1 capsule by mouth daily with evening meal and drink for rosacea 30 capsule 6   glucose blood (ONETOUCH ULTRA) test strip Use as instructed to check blood sugar once daily. 100 each 2   metroNIDAZOLE  (METROGEL ) 0.75 % gel APPLY SMALL AMOUNT TOPICALLY TO THE AFFECTED AREA 1 TO 2 TIMES DAILY AS DIRECTED 45 g 11   olmesartan  (BENICAR ) 40 MG tablet Take 1 tablet (40 mg total) by mouth daily. 90 tablet 3   OneTouch Delica Lancets 33G MISC Use as instructed to check blood sugar once daily. 100 each 2   Probiotic Product (PROBIOTIC DAILY PO) Take 1 capsule by mouth daily.     spironolactone  (ALDACTONE ) 25 MG tablet Take 1/2 tablet (12.5 mg total) by mouth daily. 30 tablet 3   triamcinolone  cream (KENALOG ) 0.1 % APPLY TOPICALLY TO THE AFFECTED AREA TWICE DAILY AS NEEDED 30 g 1   Zinc  30 MG TABS Take 1 tablet by mouth daily. (Patient taking differently: Take 1 tablet by mouth daily. Take as needed)     lovastatin  (MEVACOR ) 40 MG tablet Take 1 tablet (40 mg total) by mouth daily. (Patient taking differently: Take 40 mg by mouth at bedtime.) 90 tablet 4   No facility-administered medications prior to visit.     Per HPI unless specifically indicated in ROS section below Review of Systems  Constitutional:  Negative for activity change, appetite change, chills, fatigue, fever and unexpected weight change.  HENT:  Negative for hearing loss.   Eyes:  Negative for visual disturbance.  Respiratory:  Negative for cough, chest tightness, shortness of breath and wheezing.   Cardiovascular:  Negative for chest pain, palpitations and leg swelling.  Gastrointestinal:  Negative for abdominal  distention, abdominal pain, blood in stool, constipation, diarrhea, nausea and vomiting.  Genitourinary:  Negative for difficulty urinating and hematuria.  Musculoskeletal:  Negative for arthralgias, myalgias and neck pain.  Skin:  Negative for rash.  Neurological:  Negative for dizziness, seizures, syncope and headaches.  Hematological:  Negative for adenopathy. Does not bruise/bleed easily.  Psychiatric/Behavioral:  Negative for dysphoric mood. The patient is not nervous/anxious.     Objective:  BP 130/62   Pulse (!) 56   Temp 97.8 F (36.6 C) (Oral)   Ht 5' (1.524 m)   Wt 136 lb 2 oz (61.7 kg)   SpO2 94%  BMI 26.59 kg/m   Wt Readings from Last 3 Encounters:  01/25/24 136 lb 2 oz (61.7 kg)  11/25/23 135 lb 8 oz (61.5 kg)  11/24/23 135 lb (61.2 kg)      Physical Exam Vitals and nursing note reviewed.  Constitutional:      Appearance: Normal appearance. She is not ill-appearing.  HENT:     Head: Normocephalic and atraumatic.     Right Ear: Tympanic membrane, ear canal and external ear normal. There is no impacted cerumen.     Left Ear: Tympanic membrane, ear canal and external ear normal. There is no impacted cerumen.     Mouth/Throat:     Mouth: Mucous membranes are moist.     Pharynx: Oropharynx is clear. No oropharyngeal exudate or posterior oropharyngeal erythema.  Eyes:     General:        Right eye: No discharge.        Left eye: No discharge.     Extraocular Movements: Extraocular movements intact.     Conjunctiva/sclera: Conjunctivae normal.     Pupils: Pupils are equal, round, and reactive to light.  Neck:     Thyroid : No thyroid  mass or thyromegaly.     Vascular: No carotid bruit.  Cardiovascular:     Rate and Rhythm: Normal rate and regular rhythm.     Pulses: Normal pulses.     Heart sounds: Murmur (2/6 systolic USB) heard.  Pulmonary:     Effort: Pulmonary effort is normal. No respiratory distress.     Breath sounds: Normal breath sounds. No wheezing,  rhonchi or rales.  Abdominal:     General: Bowel sounds are normal. There is no distension.     Palpations: Abdomen is soft. There is no mass.     Tenderness: There is no abdominal tenderness. There is no guarding or rebound.     Hernia: No hernia is present.  Musculoskeletal:     Cervical back: Normal range of motion and neck supple. No rigidity.     Right lower leg: No edema.     Left lower leg: No edema.  Lymphadenopathy:     Cervical: No cervical adenopathy.  Skin:    General: Skin is warm and dry.     Findings: No rash.  Neurological:     General: No focal deficit present.     Mental Status: She is alert. Mental status is at baseline.  Psychiatric:        Mood and Affect: Mood normal.        Behavior: Behavior normal.       Results for orders placed or performed in visit on 09/30/23  HM DIABETES EYE EXAM   Collection Time: 09/29/23  1:46 PM  Result Value Ref Range   HM Diabetic Eye Exam No Retinopathy No Retinopathy    Assessment & Plan:   Problem List Items Addressed This Visit     Advanced care planning/counseling discussion (Chronic)   Previously reviewed.       Health maintenance examination - Primary (Chronic)   Preventative protocols reviewed and updated unless pt declined. Discussed healthy diet and lifestyle.       DNR (do not resuscitate) (Chronic)   confirmed      History of breast cancer   Continue yearly R screening mammogram.       Prediabetes   Udpate A1c.      Relevant Orders   Hemoglobin A1c   White coat syndrome with diagnosis of hypertension  Brings BP readings overall well controlled over the past 1-2 months - continue current regimen.       Relevant Medications   lovastatin  (MEVACOR ) 40 MG tablet   Hyperlipidemia   Chronic, stable period on lovastatin  - continue. The ASCVD Risk score (Arnett DK, et al., 2019) failed to calculate for the following reasons:   The 2019 ASCVD risk score is only valid for ages 89 to 30        Relevant Medications   lovastatin  (MEVACOR ) 40 MG tablet   Other Relevant Orders   Lipid panel   Comprehensive metabolic panel with GFR   CKD (chronic kidney disease) stage 3, GFR 30-59 ml/min (HCC)   Chronic, update renal function labs      Relevant Orders   Comprehensive metabolic panel with GFR   Phosphorus   VITAMIN D  25 Hydroxy (Vit-D Deficiency, Fractures)   Microalbumin / creatinine urine ratio   Parathyroid  hormone, intact (no Ca)   CBC with Differential/Platelet   Breast cancer, left (HCC)   Continue yearly R screening mammogram, s/p L mastectomy      Osteopenia of left forearm   Reviewed latest DEXA. Continue calcium  and vit D intake. Osteopenia but at increased fracture risk based on FRAX.  She declines osteoporosis medication at this time.       Relevant Orders   VITAMIN D  25 Hydroxy (Vit-D Deficiency, Fractures)   Mitral regurgitation   Chronic, mild, stable.       Relevant Medications   lovastatin  (MEVACOR ) 40 MG tablet   Vitamin D  deficiency   Continue vit D 1000 international units daily. Update levels.       Relevant Orders   VITAMIN D  25 Hydroxy (Vit-D Deficiency, Fractures)   Chronic constipation   CIC - no h/o bowel obstruction. Failed regular use of stool softener, probiotic, miralax , mag citrate. Rx amitiza  24mcg daily to BID dosing - update with effect.         Meds ordered this encounter  Medications   lubiprostone  (AMITIZA ) 24 MCG capsule    Sig: Take 1 capsule (24 mcg total) by mouth 2 (two) times daily with a meal.    Dispense:  60 capsule    Refill:  3   lovastatin  (MEVACOR ) 40 MG tablet    Sig: Take 1 tablet (40 mg total) by mouth at bedtime.    Dispense:  90 tablet    Refill:  3    Orders Placed This Encounter  Procedures   Lipid panel   Comprehensive metabolic panel with GFR   Phosphorus   VITAMIN D  25 Hydroxy (Vit-D Deficiency, Fractures)   Microalbumin / creatinine urine ratio   Parathyroid  hormone, intact (no Ca)    CBC with Differential/Platelet   Hemoglobin A1c    Patient Instructions  Labs today  Try amitiza  24mcg once to twice daily for constipation Good to see you today Return as needed or in 1 year for next physical   Follow up plan: Return in about 1 year (around 01/24/2025) for annual exam, prior fasting for blood work, medicare wellness visit.  Anton Blas, MD

## 2024-01-25 NOTE — Telephone Encounter (Signed)
 I called home # and left message asking pt to hold spironolactone  at this time. She is on 1/2 tab daily.

## 2024-01-26 ENCOUNTER — Ambulatory Visit: Payer: Self-pay

## 2024-01-26 DIAGNOSIS — I10 Essential (primary) hypertension: Secondary | ICD-10-CM

## 2024-01-26 LAB — MICROALBUMIN / CREATININE URINE RATIO
Creatinine,U: 34.8 mg/dL
Microalb Creat Ratio: 58.1 mg/g — ABNORMAL HIGH (ref 0.0–30.0)
Microalb, Ur: 2 mg/dL — ABNORMAL HIGH (ref 0.0–1.9)

## 2024-01-26 LAB — PARATHYROID HORMONE, INTACT (NO CA): PTH: 33 pg/mL (ref 16–77)

## 2024-01-26 NOTE — Telephone Encounter (Signed)
 FYI Only or Action Required?: Action required by provider: clinical question for provider.  Patient was last seen in primary care on 01/25/2024 by Rilla Baller, MD.  Called Nurse Triage reporting Advice Only.  Symptoms began n/a.  Interventions attempted: Other: n/a.  Symptoms are: n/a.  Triage Disposition: Call PCP When Office is Open  Patient/caregiver understands and will follow disposition?: Yes Reason for Disposition  [1] Caller requesting NON-URGENT health information AND [2] PCP's office is the best resource  Answer Assessment - Initial Assessment Questions 1. REASON FOR CALL: What is the main reason for your call? or How can I best help you?     Patient wants to know what she's supposed to do for blood pressure since being taken off the Spironolactone . Patient is also wondering when she is supposed to come in for her next lab visit to retest.   2. SYMPTOMS : Do you have any symptoms?      Denies symptoms, but has not checked blood pressure.  Protocols used: Information Only Call - No Triage-A-AH  Copied from CRM [id number]. Topic: Clinical - Medication Question >> Jan 26, 2024  8:41 AM Eva FALCON wrote: Reason for CRM: Pt stopped Spironolactone  as requested per Dr. KANDICE. She is wondering what will they do about her BP, states she was on it for that and without it her bp usually spikes up to 190. Currently not having high BP readings just wants to be cautious.  Also, looks like labs were needing to be scheduled but I did not see any orders. Pt is requesting a call back as soon as possible.

## 2024-01-26 NOTE — Telephone Encounter (Signed)
 Called patient reviewed all information. Patient states will be checking her BP dailey.

## 2024-01-31 ENCOUNTER — Ambulatory Visit: Payer: Self-pay | Admitting: Family Medicine

## 2024-01-31 NOTE — Telephone Encounter (Signed)
See previous phone note- pt was contacted

## 2024-01-31 NOTE — Addendum Note (Signed)
 Addended by: RILLA BALLER on: 01/31/2024 07:01 AM   Modules accepted: Orders

## 2024-02-03 ENCOUNTER — Other Ambulatory Visit

## 2024-02-03 DIAGNOSIS — I10 Essential (primary) hypertension: Secondary | ICD-10-CM | POA: Diagnosis not present

## 2024-02-03 LAB — BASIC METABOLIC PANEL WITH GFR
BUN: 17 mg/dL (ref 6–23)
CO2: 29 meq/L (ref 19–32)
Calcium: 10 mg/dL (ref 8.4–10.5)
Chloride: 103 meq/L (ref 96–112)
Creatinine, Ser: 1.05 mg/dL (ref 0.40–1.20)
GFR: 47.36 mL/min — ABNORMAL LOW (ref >60.00)
Glucose, Bld: 151 mg/dL — ABNORMAL HIGH (ref 70–99)
Potassium: 5 meq/L (ref 3.5–5.1)
Sodium: 140 meq/L (ref 135–145)

## 2024-02-07 ENCOUNTER — Ambulatory Visit: Payer: Self-pay | Admitting: Family Medicine

## 2024-02-08 ENCOUNTER — Encounter: Payer: Self-pay | Admitting: Family Medicine

## 2024-03-07 MED ORDER — HYDROCHLOROTHIAZIDE 12.5 MG PO CAPS: 12.5000 mg | ORAL_CAPSULE | Freq: Every day | ORAL | 1 refills | Status: AC

## 2024-03-07 NOTE — Addendum Note (Signed)
 Addended by: RILLA BALLER on: 03/07/2024 06:03 PM   Modules accepted: Orders

## 2024-03-15 ENCOUNTER — Other Ambulatory Visit: Payer: Self-pay | Admitting: Family Medicine

## 2024-03-15 DIAGNOSIS — E785 Hyperlipidemia, unspecified: Secondary | ICD-10-CM

## 2024-05-24 ENCOUNTER — Ambulatory Visit: Admitting: Cardiovascular Disease

## 2024-07-11 ENCOUNTER — Ambulatory Visit: Admitting: Dermatology

## 2024-11-24 ENCOUNTER — Ambulatory Visit

## 2025-01-23 ENCOUNTER — Other Ambulatory Visit

## 2025-01-30 ENCOUNTER — Encounter: Admitting: Family Medicine
